# Patient Record
Sex: Female | Born: 1965
Health system: Southern US, Community
[De-identification: ages and names within clinical notes are randomized; demographics above are authoritative.]

## PROBLEM LIST (undated history)

## (undated) DIAGNOSIS — F32A Depression, unspecified: Secondary | ICD-10-CM

## (undated) DIAGNOSIS — Z800 Family history of malignant neoplasm of digestive organs: Secondary | ICD-10-CM

## (undated) DIAGNOSIS — F329 Major depressive disorder, single episode, unspecified: Secondary | ICD-10-CM

## (undated) DIAGNOSIS — B029 Zoster without complications: Secondary | ICD-10-CM

## (undated) DIAGNOSIS — F909 Attention-deficit hyperactivity disorder, unspecified type: Secondary | ICD-10-CM

## (undated) DIAGNOSIS — F419 Anxiety disorder, unspecified: Secondary | ICD-10-CM

## (undated) DIAGNOSIS — H348392 Tributary (branch) retinal vein occlusion, unspecified eye, stable: Secondary | ICD-10-CM

## (undated) DIAGNOSIS — I639 Cerebral infarction, unspecified: Secondary | ICD-10-CM

## (undated) DIAGNOSIS — N950 Postmenopausal bleeding: Secondary | ICD-10-CM

## (undated) HISTORY — PX: OTHER SURGICAL HISTORY: SHX169

## (undated) HISTORY — PX: FINGER SURGERY: SHX640

## (undated) HISTORY — DX: Cerebral infarction, unspecified: I63.9

## (undated) HISTORY — DX: Depression, unspecified: F32.A

## (undated) HISTORY — DX: Attention-deficit hyperactivity disorder, unspecified type: F90.9

## (undated) HISTORY — PX: NECK SURGERY: SHX720

## (undated) HISTORY — PX: TMJ ARTHROPLASTY: SHX1066

## (undated) HISTORY — DX: Tributary (branch) retinal vein occlusion, unspecified eye, stable: H34.8392

## (undated) HISTORY — DX: Anxiety disorder, unspecified: F41.9

## (undated) HISTORY — DX: Major depressive disorder, single episode, unspecified: F32.9

## (undated) HISTORY — DX: Postmenopausal bleeding: N95.0

## (undated) HISTORY — PX: BREAST ENHANCEMENT SURGERY: SHX7

## (undated) HISTORY — PX: SALIVARY GLAND SURGERY: SHX768

## (undated) HISTORY — DX: Family history of malignant neoplasm of digestive organs: Z80.0

---
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
## 1998-03-11 ENCOUNTER — Other Ambulatory Visit: Admission: RE | Admit: 1998-03-11 | Discharge: 1998-03-11 | Payer: Self-pay | Admitting: Obstetrics and Gynecology

## 1999-04-23 ENCOUNTER — Other Ambulatory Visit: Admission: RE | Admit: 1999-04-23 | Discharge: 1999-04-23 | Payer: Self-pay | Admitting: Obstetrics and Gynecology

## 2000-04-20 ENCOUNTER — Encounter (INDEPENDENT_AMBULATORY_CARE_PROVIDER_SITE_OTHER): Payer: Self-pay | Admitting: Specialist

## 2000-04-20 ENCOUNTER — Other Ambulatory Visit: Admission: RE | Admit: 2000-04-20 | Discharge: 2000-04-20 | Payer: Self-pay | Admitting: *Deleted

## 2000-09-05 ENCOUNTER — Other Ambulatory Visit: Admission: RE | Admit: 2000-09-05 | Discharge: 2000-09-05 | Payer: Self-pay | Admitting: Obstetrics and Gynecology

## 2001-10-09 ENCOUNTER — Other Ambulatory Visit: Admission: RE | Admit: 2001-10-09 | Discharge: 2001-10-09 | Payer: Self-pay | Admitting: Obstetrics and Gynecology

## 2003-05-03 ENCOUNTER — Other Ambulatory Visit: Admission: RE | Admit: 2003-05-03 | Discharge: 2003-05-03 | Payer: Self-pay | Admitting: Obstetrics and Gynecology

## 2003-07-15 ENCOUNTER — Ambulatory Visit (HOSPITAL_COMMUNITY): Admission: RE | Admit: 2003-07-15 | Discharge: 2003-07-15 | Payer: Self-pay | Admitting: Family Medicine

## 2003-07-15 ENCOUNTER — Encounter: Payer: Self-pay | Admitting: Family Medicine

## 2004-08-31 ENCOUNTER — Other Ambulatory Visit: Admission: RE | Admit: 2004-08-31 | Discharge: 2004-08-31 | Payer: Self-pay | Admitting: Family Medicine

## 2005-09-01 ENCOUNTER — Other Ambulatory Visit: Admission: RE | Admit: 2005-09-01 | Discharge: 2005-09-01 | Payer: Self-pay | Admitting: Family Medicine

## 2006-09-06 ENCOUNTER — Other Ambulatory Visit: Admission: RE | Admit: 2006-09-06 | Discharge: 2006-09-06 | Payer: Self-pay | Admitting: Family Medicine

## 2007-12-27 ENCOUNTER — Other Ambulatory Visit: Admission: RE | Admit: 2007-12-27 | Discharge: 2007-12-27 | Payer: Self-pay | Admitting: Family Medicine

## 2011-01-03 ENCOUNTER — Encounter: Payer: Self-pay | Admitting: Otolaryngology

## 2011-11-24 ENCOUNTER — Ambulatory Visit: Payer: Self-pay

## 2011-11-24 DIAGNOSIS — H34239 Retinal artery branch occlusion, unspecified eye: Secondary | ICD-10-CM

## 2012-11-13 ENCOUNTER — Other Ambulatory Visit (HOSPITAL_BASED_OUTPATIENT_CLINIC_OR_DEPARTMENT_OTHER): Payer: Self-pay | Admitting: Family Medicine

## 2012-11-17 ENCOUNTER — Other Ambulatory Visit: Payer: Self-pay | Admitting: Family Medicine

## 2012-11-17 DIAGNOSIS — N644 Mastodynia: Secondary | ICD-10-CM

## 2012-11-17 DIAGNOSIS — R234 Changes in skin texture: Secondary | ICD-10-CM

## 2012-12-13 DIAGNOSIS — H348392 Tributary (branch) retinal vein occlusion, unspecified eye, stable: Secondary | ICD-10-CM

## 2012-12-13 DIAGNOSIS — I639 Cerebral infarction, unspecified: Secondary | ICD-10-CM

## 2012-12-13 HISTORY — DX: Cerebral infarction, unspecified: I63.9

## 2012-12-13 HISTORY — DX: Tributary (branch) retinal vein occlusion, unspecified eye, stable: H34.8392

## 2013-07-11 ENCOUNTER — Emergency Department (HOSPITAL_BASED_OUTPATIENT_CLINIC_OR_DEPARTMENT_OTHER)
Admission: EM | Admit: 2013-07-11 | Discharge: 2013-07-11 | Disposition: A | Discharge status: Home / Self Care | Payer: Self-pay | Attending: Emergency Medicine | Admitting: Emergency Medicine

## 2013-07-11 ENCOUNTER — Encounter (HOSPITAL_BASED_OUTPATIENT_CLINIC_OR_DEPARTMENT_OTHER): Payer: Self-pay | Admitting: *Deleted

## 2013-07-11 ENCOUNTER — Emergency Department (HOSPITAL_BASED_OUTPATIENT_CLINIC_OR_DEPARTMENT_OTHER): Payer: Self-pay

## 2013-07-11 DIAGNOSIS — F329 Major depressive disorder, single episode, unspecified: Secondary | ICD-10-CM | POA: Insufficient documentation

## 2013-07-11 DIAGNOSIS — Z880 Allergy status to penicillin: Secondary | ICD-10-CM | POA: Insufficient documentation

## 2013-07-11 DIAGNOSIS — F3289 Other specified depressive episodes: Secondary | ICD-10-CM | POA: Insufficient documentation

## 2013-07-11 DIAGNOSIS — S93609A Unspecified sprain of unspecified foot, initial encounter: Secondary | ICD-10-CM | POA: Insufficient documentation

## 2013-07-11 DIAGNOSIS — Y9289 Other specified places as the place of occurrence of the external cause: Secondary | ICD-10-CM | POA: Insufficient documentation

## 2013-07-11 DIAGNOSIS — S93602A Unspecified sprain of left foot, initial encounter: Secondary | ICD-10-CM

## 2013-07-11 DIAGNOSIS — Y939 Activity, unspecified: Secondary | ICD-10-CM | POA: Insufficient documentation

## 2013-07-11 DIAGNOSIS — Z79899 Other long term (current) drug therapy: Secondary | ICD-10-CM | POA: Insufficient documentation

## 2013-07-11 DIAGNOSIS — X500XXA Overexertion from strenuous movement or load, initial encounter: Secondary | ICD-10-CM | POA: Insufficient documentation

## 2013-07-11 DIAGNOSIS — I10 Essential (primary) hypertension: Secondary | ICD-10-CM | POA: Insufficient documentation

## 2013-07-11 HISTORY — DX: Depression, unspecified: F32.A

## 2013-07-11 HISTORY — DX: Major depressive disorder, single episode, unspecified: F32.9

## 2013-07-11 NOTE — ED Notes (Signed)
Pt to room 7 with slow steady gait in nad. Pt reports fall in her bathroom x 2 weeks ago, "the arch of my foot got twisted..." shoe and ace brace removed to reveal swelling and contusions to arch area, inner and outer aspects. Pt denies any other injury or c/o.

## 2013-07-11 NOTE — ED Notes (Signed)
MD at bedside. 

## 2013-07-11 NOTE — ED Provider Notes (Signed)
  CSN: [id number]     Arrival date & time 07/11/13  1021 History     First MD Initiated Contact with Patient 07/11/13 1030     Chief Complaint  Patient presents with  . Foot Pain  . Fall    HPI Pt to room 7 with slow steady gait in nad. Pt reports fall in her bathroom x 2 weeks ago, "the arch of my foot got twisted..." shoe and ace brace removed to reveal swelling and contusions to arch area, inner and outer aspects. Pt denies any other injury or c/o.  Past Medical History  Diagnosis Date  . Hypertension   . Depression    History reviewed. No pertinent past surgical history. History reviewed. No pertinent family history. History  Substance Use Topics  . Smoking status: Never Smoker   . Smokeless tobacco: Not on file  . Alcohol Use: Not on file   OB History   Grav Para Term Preterm Abortions TAB SAB Ect Mult Living                 Review of Systems All other systems reviewed and are negative Allergies  Penicillins and Sulfa antibiotics  Home Medications   Current Outpatient Rx  Name  Route  Sig  Dispense  Refill  . carvedilol (COREG) 6.25 MG tablet   Oral   Take 6.25 mg by mouth 2 (two) times daily with a meal.         . FLUoxetine (PROZAC) 20 MG capsule   Oral   Take 20 mg by mouth 2 (two) times daily.          BP 117/60  Pulse 69  Temp(Src) 97.5 F (36.4 C) (Oral)  Resp 20  SpO2 99% Physical Exam  Nursing note and vitals reviewed. Constitutional: She is oriented to person, place, and time. She appears well-developed and well-nourished. No distress.  HENT:  Head: Normocephalic and atraumatic.  Eyes: Pupils are equal, round, and reactive to light.  Neck: Normal range of motion.  Cardiovascular: Normal rate and intact distal pulses.   Pulmonary/Chest: No respiratory distress.  Abdominal: Normal appearance. She exhibits no distension.  Musculoskeletal:       Left foot: She exhibits tenderness.       Feet:  Neurological: She is alert and oriented to  person, place, and time. No cranial nerve deficit.  Skin: Skin is warm and dry. No rash noted.  Psychiatric: She has a normal mood and affect. Her behavior is normal.    ED Course   Procedures (including critical care time)  Labs Reviewed - No data to display Dg Foot Complete Left  07/11/2013   *RADIOLOGY REPORT*  Clinical Data: Pain post fall, twisted foot  LEFT FOOT - COMPLETE 3+ VIEW  Comparison: None.  Findings: Three views of the left foot submitted.  No acute fracture or subluxation.  Plantar spur of the calcaneus is noted.  IMPRESSION: No acute fracture or subluxation.  Plantar spurring of the calcaneus.   Original Report Authenticated By: Natasha Mead, M.D.   1. Foot sprain, left, initial encounter     MDM    Nelia Shi, MD 07/11/13 1136

## 2014-01-15 ENCOUNTER — Ambulatory Visit (INDEPENDENT_AMBULATORY_CARE_PROVIDER_SITE_OTHER): Payer: Self-pay | Admitting: Family

## 2014-01-15 ENCOUNTER — Encounter: Payer: Self-pay | Admitting: Family

## 2014-01-15 VITALS — BP 154/98 | HR 97 | Ht 64.5 in | Wt 166.0 lb

## 2014-01-15 DIAGNOSIS — I10 Essential (primary) hypertension: Secondary | ICD-10-CM | POA: Insufficient documentation

## 2014-01-15 DIAGNOSIS — Z3009 Encounter for other general counseling and advice on contraception: Secondary | ICD-10-CM

## 2014-01-15 DIAGNOSIS — F988 Other specified behavioral and emotional disorders with onset usually occurring in childhood and adolescence: Secondary | ICD-10-CM

## 2014-01-15 DIAGNOSIS — F411 Generalized anxiety disorder: Secondary | ICD-10-CM | POA: Insufficient documentation

## 2014-01-15 MED ORDER — CARVEDILOL 6.25 MG PO TABS: 6.2500 mg | ORAL_TABLET | Freq: Two times a day (BID) | ORAL | Status: DC

## 2014-01-15 NOTE — Progress Notes (Signed)
Pre visit review using our clinic review tool, if applicable. No additional management support is needed unless otherwise documented below in the visit note. 

## 2014-01-15 NOTE — Patient Instructions (Signed)
Contraception Choices °Birth control (contraception) is the use of any methods or devices to stop pregnancy from happening. Below are some methods to help avoid pregnancy. °HORMONAL BIRTH CONTROL °· A small tube put under the skin of the upper arm (implant). The tube can stay in place for 3 years. The implant must be taken out after 3 years. °· Shots given every 3 months. °· Pills taken every day. °· Patches that are changed once a week. °· A ring put into the vagina (vaginal ring). The ring is left in place for 3 weeks and removed for 1 week. Then, a new ring is put in the vagina. °· Emergency birth control pills taken after unprotected sex (intercourse). °BARRIER BIRTH CONTROL  °· A thin covering worn on the penis (female condom) during sex. °· A soft, loose covering put into the vagina (female condom) before sex. °· A rubber bowl that sits over the cervix (diaphragm). The bowl must be made for you. The bowl is put into the vagina before sex. The bowl is left in place for 6 to 8 hours after sex. °· A small, soft cup that fits over the cervix (cervical cap). The cup must be made for you. The cup can be left in place for 48 hours after sex. °· A sponge that is put into the vagina before sex. °· A chemical that kills or stops sperm from getting into the cervix and uterus (spermicide). The chemical may be a cream, jelly, foam, or pill. °INTRAUTERINE (IUD) BIRTH CONTROL  °· IUD birth control is a small, T-shaped piece of plastic. The plastic is put inside the uterus. There are 2 types of IUD: °· Copper IUD. The IUD is covered in copper wire. The copper makes a fluid that kills sperm. It can stay in place for 10 years. °· Hormone IUD. The hormone stops pregnancy from happening. It can stay in place for 5 years. °PERMANENT METHODS °· When the woman has her fallopian tubes sealed, tied, or blocked during surgery. This stops the egg from traveling to the uterus. °· The doctor places a small coil or insert into each fallopian  tube. This causes scar tissue to form and blocks the fallopian tubes. °· When the female has the tubes that carry sperm tied off (vasectomy). °NATURAL FAMILY PLANNING BIRTH CONTROL  °· Natural family planning means not having sex or using barrier birth control on the days the woman could become pregnant. °· Use a calendar to keep track of the length of each period and know the days she can get pregnant. °· Avoid sex during ovulation. °· Use a thermometer to measure body temperature. Also watch for symptoms of ovulation. °· Time sex to be after the woman has ovulated. °Use condoms to help protect yourself against sexually transmitted infections (STIs). Do this no matter what type of birth control you use. Talk to your doctor about which type of birth control is best for you. °Document Released: 09/26/2009 Document Revised: 08/01/2013 Document Reviewed: 06/20/2013 °[employer]® Patient Information ©2014 [employer]. ° °

## 2014-01-15 NOTE — Progress Notes (Signed)
Subjective:    Patient ID: Haley MichaelisLisa M Cuddeback, female    DOB: Apr 15, 1966, 48 y.o.   MRN: [id number]  HPI 48 year old white female, nonsmoker is in today to be established. 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 She is currently under the care of psychiatry. Has been taking carvedilol 6.25 twice a day for management of blood pressure and tolerating it well. She's been out of the medication for 2 weeks. She would also like to talk about birth control options as she is planning to marry soon. She has a normal menstrual cycle monthly. Not currently on any birth control.   Review of Systems  Constitutional: Negative.   HENT: Negative.   Respiratory: Negative.   Cardiovascular: Negative.  Negative for chest pain and leg swelling.  Gastrointestinal: Negative.   Endocrine: Negative.   Genitourinary: Negative.   Musculoskeletal: Negative.   Skin: Negative.   Neurological: Negative.   Hematological: Negative.   Psychiatric/Behavioral: Negative.    Past Medical History  Diagnosis Date  . Hypertension   . Depression   . Stroke     stroke in rt eye 2014    History   Social History  . Marital Status: Divorced    Spouse Name: N/A    Number of Children: N/A  . Years of Education: N/A   Occupational History  . Not on file.   Social History Main Topics  . Smoking status: Never Smoker   . Smokeless tobacco: Not on file  . Alcohol Use: Not on file  . Drug Use: Not on file  . Sexual Activity: Not on file   Other Topics Concern  . Not on file   Social History Narrative  . No narrative on file    Past Surgical History  Procedure Laterality Date  . Breast enhancement surgery    . Salivary gland surgery      stone   . Neck surgery    . Tmj arthroplasty    . Finger surgery      cyst    No family history on file.  Allergies  Allergen Reactions  . Penicillins   . Sulfa Antibiotics     Current Outpatient Prescriptions  on File Prior to Visit  Medication Sig Dispense Refill  . FLUoxetine (PROZAC) 20 MG capsule Take 20 mg by mouth 2 (two) times daily.       No current facility-administered medications on file prior to visit.    BP 154/98  Pulse 97  Ht 5' 4.5" (1.638 m)  Wt 166 lb (75.297 kg)  BMI 28.06 kg/m2  LMP 12/30/2014chart    Objective:   Physical Exam  Constitutional: She is oriented to person, place, and time. She appears well-developed and well-nourished.  HENT:  Right Ear: External ear normal.  Left Ear: External ear normal.  Nose: Nose normal.  Mouth/Throat: Oropharynx is clear and moist.  Neck: Normal range of motion. Neck supple.  Cardiovascular: Normal rate, regular rhythm and normal heart sounds.   Pulmonary/Chest: Effort normal and breath sounds normal.  Abdominal: Soft. Bowel sounds are normal.  Musculoskeletal: Normal range of motion.  Neurological: She is alert and oriented to person, place, and time.  Skin: Skin is warm.  Psychiatric: She has a normal mood and affect.          Assessment & Plan:  Assessment: 1. Hypertension-resume carvedilol twice a day low-sodium diet. 2. Attention deficit disorder-managed by psychiatry 3. Anxiety-managed by psychiatry 4. Birth  control counseling-consider Depo Provera or Mirena. Information provided today. She is not a candidate for oral contraceptive pills.

## 2014-01-16 ENCOUNTER — Telehealth: Payer: Self-pay | Admitting: Family

## 2014-01-16 NOTE — Telephone Encounter (Signed)
Relevant patient education mailed to patient.  

## 2014-07-22 ENCOUNTER — Other Ambulatory Visit: Payer: Self-pay | Admitting: Family

## 2014-09-12 ENCOUNTER — Ambulatory Visit (INDEPENDENT_AMBULATORY_CARE_PROVIDER_SITE_OTHER): Payer: Self-pay | Admitting: *Deleted

## 2014-09-12 DIAGNOSIS — Z23 Encounter for immunization: Secondary | ICD-10-CM

## 2014-10-14 ENCOUNTER — Encounter: Payer: Self-pay | Admitting: Family

## 2015-01-20 ENCOUNTER — Ambulatory Visit: Payer: Self-pay | Admitting: Family Medicine

## 2015-01-20 ENCOUNTER — Encounter: Payer: Self-pay | Admitting: Family Medicine

## 2015-03-25 ENCOUNTER — Encounter: Payer: Self-pay | Admitting: Adult Health

## 2015-03-25 ENCOUNTER — Ambulatory Visit (INDEPENDENT_AMBULATORY_CARE_PROVIDER_SITE_OTHER): Payer: No Typology Code available for payment source | Admitting: Adult Health

## 2015-03-25 VITALS — BP 124/88 | Temp 98.7°F | Wt 179.1 lb

## 2015-03-25 DIAGNOSIS — T148XXA Other injury of unspecified body region, initial encounter: Secondary | ICD-10-CM

## 2015-03-25 DIAGNOSIS — S46911A Strain of unspecified muscle, fascia and tendon at shoulder and upper arm level, right arm, initial encounter: Secondary | ICD-10-CM

## 2015-03-25 DIAGNOSIS — T148 Other injury of unspecified body region: Secondary | ICD-10-CM

## 2015-03-25 MED ORDER — CYCLOBENZAPRINE HCL 10 MG PO TABS: 10.0000 mg | ORAL_TABLET | Freq: Every day | ORAL | Status: DC

## 2015-03-25 NOTE — Patient Instructions (Signed)
You likely have a muscle strain and bruise from your fall. I am not concerned for any type of fracture as you have great range in your right arm and shoulder. Take 600mg  Ibuprofen every eight hours and Flexeril as needed at night time, as they can make you drowsy. You can also use ice or heat as you see fit.  You will probably bruise more, but if the bruising becomes severe or you are unable to move your arm, please follow up with me. You should start to feel better in 2-3 days.

## 2015-03-25 NOTE — Progress Notes (Signed)
Pre visit review using our clinic review tool, if applicable. No additional management support is needed unless otherwise documented below in the visit note. 

## 2015-03-25 NOTE — Progress Notes (Signed)
   Subjective:    Patient ID: Haley MichaelisLisa M Martin, female    DOB: Feb 21, 1966, 49 y.o.   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 This fall resulted in her hitting her left shoulder onto the bottom of the hot tub. She only complains of slight pain to shoulder and bruising. Has not taken anything for pain.    Review of Systems  Musculoskeletal: Negative for joint swelling, neck pain and neck stiffness.       Pain and bruising to right shoulder  All other systems reviewed and are negative.  Past Medical History  Diagnosis Date  . Hypertension   . Anxiety and depression   . Stroke     stroke in rt eye 2014  . Adult ADHD     History   Social History  . Marital Status: Divorced    Spouse Name: N/A  . Number of Children: N/A  . Years of Education: N/A   Occupational History  . Not on file.   Social History Main Topics  . Smoking status: Never Smoker   . Smokeless tobacco: Not on file  . Alcohol Use: Not on file  . Drug Use: Not on file  . Sexual Activity: Not on file   Other Topics Concern  . Not on file   Social History Narrative    Past Surgical History  Procedure Laterality Date  . Breast enhancement surgery    . Salivary gland surgery      stone   . Neck surgery    . Tmj arthroplasty    . Finger surgery      cyst    No family history on file.  Allergies  Allergen Reactions  . Penicillins Anaphylaxis  . Sulfa Antibiotics Anaphylaxis    Current Outpatient Prescriptions on File Prior to Visit  Medication Sig Dispense Refill  . ALPRAZolam (XANAX) 1 MG tablet Take 1 mg by mouth at bedtime as needed for anxiety.    . carvedilol (COREG) 6.25 MG tablet TAKE ONE TABLET BY MOUTH TWICE DAILY WITH MEALS  180 tablet 0  . FLUoxetine (PROZAC) 20 MG capsule Take 20 mg by mouth 2 (two) times daily.    . methylphenidate (RITALIN) 20 MG tablet Take 20 mg by mouth 3 (three) times daily with meals.      No current facility-administered medications on file prior to visit.    BP 124/88 mmHg  Temp(Src) 98.7 F (37.1 C) (Oral)  Wt 179 lb 1.6 oz (81.239 kg)       Objective:   Physical Exam  Constitutional: She appears well-developed and well-nourished. No distress.  Neck: Normal range of motion. Neck supple.  Musculoskeletal: Normal range of motion.  Has complete ROM in right arm/shoulder  Skin:  Small quarter size bruise on lateral aspect of right shoulder        Assessment & Plan:  Muscle Strain/Hematoma - No concern for fracture or dislocation due to abscense of defomity and the ability to have full ROM - Ibuprofen for pain control and swelling - Prescribed Flexeril 10 mg QHS PRN for muscle pain - Ice/heat  - Follow up as needed.

## 2015-04-21 ENCOUNTER — Ambulatory Visit: Payer: Self-pay | Admitting: Nurse Practitioner

## 2015-07-25 ENCOUNTER — Other Ambulatory Visit: Payer: Self-pay | Admitting: Family

## 2015-07-25 ENCOUNTER — Encounter: Payer: Self-pay | Admitting: Adult Health

## 2015-07-25 ENCOUNTER — Ambulatory Visit (INDEPENDENT_AMBULATORY_CARE_PROVIDER_SITE_OTHER): Payer: No Typology Code available for payment source | Admitting: Adult Health

## 2015-07-25 VITALS — BP 132/100 | Temp 98.1°F | Ht 64.5 in | Wt 175.9 lb

## 2015-07-25 DIAGNOSIS — I10 Essential (primary) hypertension: Secondary | ICD-10-CM | POA: Diagnosis not present

## 2015-07-25 LAB — BASIC METABOLIC PANEL
BUN: 10 mg/dL (ref 6–23)
CALCIUM: 9.5 mg/dL (ref 8.4–10.5)
CO2: 30 meq/L (ref 19–32)
CREATININE: 0.71 mg/dL (ref 0.40–1.20)
Chloride: 101 mEq/L (ref 96–112)
GFR: 92.26 mL/min (ref >60.00)
GLUCOSE: 100 mg/dL — AB (ref 70–99)
Potassium: 4.3 mEq/L (ref 3.5–5.1)
Sodium: 137 mEq/L (ref 135–145)

## 2015-07-25 MED ORDER — HYDROCHLOROTHIAZIDE 12.5 MG PO CAPS: 12.5000 mg | ORAL_CAPSULE | Freq: Every day | ORAL | Status: DC

## 2015-07-25 NOTE — Patient Instructions (Signed)
It was great meeting you.   I will follow up with you regarding your blood work.   Take the HCTZ as directed.   Start monitoring your blood pressure at home and bring the log with you to your next appointment.

## 2015-07-25 NOTE — Progress Notes (Signed)
Subjective:    Patient ID: Haley Martin, female    DOB: Jan 16, 1966, 49 y.o.   MRN: [id number]  HPI 49 year old female who presents to the office for blood pressure check. She is on Coreg 6.25. Today in the offce her blood pressure is 132/100. She continues to have blurred vision in her right eye form her " stroke in my eye". Is followed by optho for this.   Denies any headaches, dizziness, lightheadedness, SOB or CP   Review of Systems  Constitutional: Negative.   Eyes: Positive for visual disturbance.  Respiratory: Negative.   Cardiovascular: Negative.   Gastrointestinal: Negative.   Genitourinary: Negative.   Musculoskeletal: Negative.   Neurological: Negative.   All other systems reviewed and are negative.  Past Medical History  Diagnosis Date  . Hypertension   . Anxiety and depression   . Stroke     stroke in rt eye 2014  . Adult ADHD     Social History   Social History  . Marital Status: Divorced    Spouse Name: N/A  . Number of Children: N/A  . Years of Education: N/A   Occupational History  . Not on file.   Social History Main Topics  . Smoking status: Never Smoker   . Smokeless tobacco: Not on file  . Alcohol Use: Not on file  . Drug Use: Not on file  . Sexual Activity: Not on file   Other Topics Concern  . Not on file   Social History Narrative    Past Surgical History  Procedure Laterality Date  . Breast enhancement surgery    . Salivary gland surgery      stone   . Neck surgery    . Tmj arthroplasty    . Finger surgery      cyst    No family history on file.  Allergies  Allergen Reactions  . Penicillins Anaphylaxis  . Sulfa Antibiotics Anaphylaxis    Current Outpatient Prescriptions on File Prior to Visit  Medication Sig Dispense Refill  . ALPRAZolam (XANAX) 1 MG tablet Take 1 mg by mouth at bedtime as needed for anxiety.    Marland Kitchen buPROPion (WELLBUTRIN SR) 150 MG 12 hr tablet     . carvedilol (COREG) 6.25 MG tablet TAKE ONE TABLET  BY MOUTH TWICE DAILY WITH MEALS  180 tablet 0  . FLUoxetine (PROZAC) 20 MG capsule Take 20 mg by mouth 2 (two) times daily.    . methylphenidate (RITALIN) 20 MG tablet Take 20 mg by mouth 3 (three) times daily with meals.     No current facility-administered medications on file prior to visit.    BP 132/100 mmHg  Temp(Src) 98.1 F (36.7 C) (Oral)  Ht 5' 4.5" (1.638 m)  Wt 175 lb 14.4 oz (79.788 kg)  BMI 29.74 kg/m2       Objective:   Physical Exam  Constitutional: She is oriented to person, place, and time. She appears well-developed and well-nourished.  Cardiovascular: Normal rate, regular rhythm, normal heart sounds and intact distal pulses.  Exam reveals no gallop and no friction rub.   No murmur heard. Pulmonary/Chest: Effort normal and breath sounds normal. No respiratory distress. She has no wheezes. She has no rales. She exhibits no tenderness.  Musculoskeletal: Normal range of motion. She exhibits no edema or tenderness.  Neurological: She is alert and oriented to person, place, and time.  Skin: Skin is warm and dry. No rash noted. No erythema. No pallor.  Psychiatric:  She has a normal mood and affect. Her behavior is normal. Judgment and thought content normal.  Nursing note and vitals reviewed.      Assessment & Plan:  1. Essential hypertension - hydrochlorothiazide (MICROZIDE) 12.5 MG capsule; Take 1 capsule (12.5 mg total) by mouth daily.  Dispense: 30 capsule; Refill: 3 - Basic metabolic panel - Monitor BP at home and bring log.  - Inform me if BP goes below 110 systolic.

## 2015-07-25 NOTE — Progress Notes (Signed)
Pre visit review using our clinic review tool, if applicable. No additional management support is needed unless otherwise documented below in the visit note. 

## 2015-07-28 ENCOUNTER — Telehealth: Payer: Self-pay | Admitting: Family

## 2015-07-28 MED ORDER — CARVEDILOL 6.25 MG PO TABS: 6.2500 mg | ORAL_TABLET | Freq: Two times a day (BID) | ORAL | Status: DC

## 2015-07-28 NOTE — Telephone Encounter (Signed)
Pt saw cory on 07-25-15 and needs refill on carvedilol 6.25 mg #180 send to [facility]

## 2015-07-28 NOTE — Telephone Encounter (Signed)
Rx sent to pharmacy   

## 2015-09-10 ENCOUNTER — Ambulatory Visit: Payer: No Typology Code available for payment source | Admitting: Adult Health

## 2015-09-10 ENCOUNTER — Encounter: Payer: Self-pay | Admitting: *Deleted

## 2015-09-10 ENCOUNTER — Ambulatory Visit (INDEPENDENT_AMBULATORY_CARE_PROVIDER_SITE_OTHER): Payer: No Typology Code available for payment source | Admitting: Adult Health

## 2015-09-10 ENCOUNTER — Encounter: Payer: Self-pay | Admitting: Adult Health

## 2015-09-10 VITALS — BP 122/70 | HR 97 | Temp 98.8°F | Ht 64.0 in | Wt 178.8 lb

## 2015-09-10 DIAGNOSIS — F329 Major depressive disorder, single episode, unspecified: Secondary | ICD-10-CM | POA: Diagnosis not present

## 2015-09-10 DIAGNOSIS — I10 Essential (primary) hypertension: Secondary | ICD-10-CM

## 2015-09-10 DIAGNOSIS — F411 Generalized anxiety disorder: Secondary | ICD-10-CM | POA: Diagnosis not present

## 2015-09-10 DIAGNOSIS — R4789 Other speech disturbances: Secondary | ICD-10-CM

## 2015-09-10 DIAGNOSIS — Z7189 Other specified counseling: Secondary | ICD-10-CM | POA: Diagnosis not present

## 2015-09-10 DIAGNOSIS — F32A Depression, unspecified: Secondary | ICD-10-CM

## 2015-09-10 DIAGNOSIS — Z7689 Persons encountering health services in other specified circumstances: Secondary | ICD-10-CM

## 2015-09-10 DIAGNOSIS — Z23 Encounter for immunization: Secondary | ICD-10-CM | POA: Diagnosis not present

## 2015-09-10 LAB — CBC WITH DIFFERENTIAL/PLATELET
BASOS ABS: 0 10*3/uL (ref 0.0–0.1)
Basophils Relative: 0.3 % (ref 0.0–3.0)
Eosinophils Absolute: 0.1 10*3/uL (ref 0.0–0.7)
Eosinophils Relative: 1 % (ref 0.0–5.0)
HEMATOCRIT: 47.3 % — AB (ref 36.0–46.0)
Hemoglobin: 15.9 g/dL — ABNORMAL HIGH (ref 12.0–15.0)
LYMPHS PCT: 24.1 % (ref 12.0–46.0)
Lymphs Abs: 2.7 10*3/uL (ref 0.7–4.0)
MCHC: 33.5 g/dL (ref 30.0–36.0)
MCV: 96 fl (ref 78.0–100.0)
MONOS PCT: 6.6 % (ref 3.0–12.0)
Monocytes Absolute: 0.7 10*3/uL (ref 0.1–1.0)
NEUTROS PCT: 68 % (ref 43.0–77.0)
Neutro Abs: 7.6 10*3/uL (ref 1.4–7.7)
Platelets: 318 10*3/uL (ref 150.0–400.0)
RBC: 4.93 Mil/uL (ref 3.87–5.11)
RDW: 12.6 % (ref 11.5–15.5)
WBC: 11.2 10*3/uL — ABNORMAL HIGH (ref 4.0–10.5)

## 2015-09-10 LAB — BASIC METABOLIC PANEL
BUN: 9 mg/dL (ref 6–23)
CALCIUM: 9.5 mg/dL (ref 8.4–10.5)
CO2: 30 mEq/L (ref 19–32)
Chloride: 99 mEq/L (ref 96–112)
Creatinine, Ser: 0.69 mg/dL (ref 0.40–1.20)
GFR: 95.3 mL/min (ref >60.00)
Glucose, Bld: 97 mg/dL (ref 70–99)
Potassium: 4 mEq/L (ref 3.5–5.1)
SODIUM: 137 meq/L (ref 135–145)

## 2015-09-10 LAB — VITAMIN B12: VITAMIN B 12: 1309 pg/mL — AB (ref 211–911)

## 2015-09-10 LAB — TSH: TSH: 0.84 u[IU]/mL (ref 0.35–4.50)

## 2015-09-10 NOTE — Progress Notes (Signed)
HPI:  Haley Martin is here to establish care. She is a very pleasant 49 year old Caucasian female who  has a past medical history of Hypertension; Anxiety and depression; Stroke; and Adult ADHD.  Last PCP and physical: Unknown >6 years ago Immunizations: UTD Diet: Stays away from carbs. Tries to eat healthy. She does eat processed foods often.  Exercise: has started walking dogs. She does clean houses  Colonoscopy: Never had one, will schedule  Pap Smear:5-6 years ago Mammogram: 10 years ago  Dr. Lafayette Dragon - Sees Psychiatry, sees every 6 months Has the following chronic problems that require follow up and concerns today:  Depression - She is taking Xanax 1 mg at bedtime when necessary as well as Wellbutrin 150 mg every 12 hours, additionally she is also taking Prozac 20 mg 2 times a day. Recently she's had a lot of stress in her life dealing with family issues, especially in attempted suicide by jumping off a bridge of her son. He does not currently feel like her depression is well controlled. She is not suicidal or homicidal.   Word Finding issues - Started around four months ago. 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 I could imagine the word 'toaster' but I could not say it." " [place], I kept saying tent instead of sleeping bag'. She has noticed that there are times when she has forgotten to turn her car off. Her word finding issues do not happen every day but are beginning to happen more often. Denies any trauma to head.    ROS negative for unless reported above: fevers, chills,feeling poorly, unintentional weight loss, hearing or vision loss, chest pain, palpitations, leg claudication, struggling to breath,Not feeling congested in the chest, no orthopenia, no cough,no wheezing, normal appetite, no soft tissue  swelling, no hemoptysis, melena, hematochezia, hematuria, falls, loc, si, or thoughts of self harm.    Past Medical History  Diagnosis Date  . Hypertension   . Anxiety and depression   . Stroke     stroke in rt eye 2014  . Adult ADHD     Past Surgical History  Procedure Laterality Date  . Breast enhancement surgery    . Salivary gland surgery      stone   . Neck surgery    . Tmj arthroplasty    . Finger surgery      cyst    No family history on file.  Social History   Social History  . Marital Status: Divorced    Spouse Name: N/A  . Number of Children: N/A  . Years of Education: N/A   Social History Main Topics  . Smoking status: Never Smoker   . Smokeless tobacco: None  . Alcohol Use: None  . Drug Use: None  . Sexual Activity: Not Asked   Other Topics Concern  . None   Social History Narrative     Current outpatient prescriptions:  .  ALPRAZolam (XANAX) 1 MG tablet, Take 1 mg by mouth at bedtime as needed for anxiety., Disp: , Rfl:  .  buPROPion (WELLBUTRIN SR) 150 MG 12 hr tablet, , Disp: , Rfl:  .  carvedilol (COREG) 6.25 MG tablet, Take 1 tablet (6.25 mg total) by mouth 2 (two) times daily with a meal., Disp: 180 tablet, Rfl: 1 .  FLUoxetine (PROZAC) 20 MG capsule, Take 20 mg by mouth 2 (two) times daily., Disp: , Rfl:  .  hydrochlorothiazide (MICROZIDE) 12.5 MG capsule, Take 1 capsule (12.5 mg total) by mouth daily., Disp: 30 capsule, Rfl: 3 .  methylphenidate (RITALIN) 20 MG tablet, Take 20 mg by mouth 3 (three) times daily with meals., Disp: , Rfl:   EXAM:  Filed Vitals:   09/10/15 1413  BP: 122/70  Pulse: 97  Temp: 98.8 F (37.1 C)    Body mass index is 30.68 kg/(m^2).  GENERAL: vitals reviewed and listed above, alert, oriented, appears well hydrated and in no acute distress  HEENT: atraumatic, conjunttiva clear, no obvious abnormalities on inspection of external nose and ears.  tympanic membranes visualized, no cerumen impaction.   Bilateral eyes PERRLA.  NECK: Neck is soft and supple without masses, no adenopathy or thyromegaly, trachea midline, no JVD. Normal range of motion.   LUNGS: clear to auscultation bilaterally, no wheezes, rales or rhonchi, good air movement  CV: Regular rate and rhythm, normal S1/S2, no audible murmurs, gallops, or rubs. No carotid bruit and no peripheral edema.   MS: moves all extremities without noticeable abnormality. No edema noted  Abd: soft/nontender/nondistended/normal bowel sounds   Skin: warm and dry, no rash. Has large cysts on top of head  Extremities: No clubbing, cyanosis, or edema. Capillary refill is WNL. Pulses intact bilaterally in upper and lower extremities.   Neuro: CN II-XII intact, sensation and reflexes normal throughout, 5/5 muscle strength in bilateral upper and lower extremities. Normal finger to nose. Normal rapid alternating movements. Normal romberg. No pronator drift.   PSYCH: pleasant and cooperative, multiple crying episodes and office appointment today.  ASSESSMENT AND PLAN:  1. Encounter to establish care -Follow-up in one month for CPE -Follow up sooner if needed  2. Word finding difficulty -Concern for previous on diagnosis stroke, or new stroke due to aphasia - MR Brain W Wo Contrast; Future - US Carotid Duplex Bilateral; Future - Vitamin B12 - TSH - Basic metabolic panel - CBC with Differential/Platelet -Follow-up in one month, or sooner if needed -Consider daily aspirin therapy if MRI shows no acute bleed -Consider echo to rule out any issues with emboli. -Consider referral to neurology.  3. Essential hypertension -Controlled with current medication. She does not check it at home on a regular basis. Denies any headaches blurred vision or dizziness. We'll continue to monitor - Basic metabolic panel  4. Encounter for immunization -Vaccination given  5. Depression -Encouraged to follow-up with her psychiatrist more often than every  6 months. She might have to see her psychiatrist once a month for a limited amount of time. -Continue to take current medications - Vitamin B12 - TSH   -We reviewed the PMH, PSH, FH, SH, Meds and Allergies. -We provided refills for any medications we will prescribe as needed. -We addressed current concerns per orders and patient instructions. -We have asked for records for pertinent exams, studies, vaccines and notes from previous providers. -We have advised patient to follow up per instructions below.   -Patient advised to return or notify a provider immediately if symptoms worsen or persist or new concerns arise.   Shirline Frees, AGNP

## 2015-09-10 NOTE — Patient Instructions (Signed)
It was great seeing you again today!  I will follow up with you regarding labs.   Someone will call you to schedule a MRI, Ultrasound and Colonoscopy.   Please come back and see me in 1 months for complete physical exam .

## 2015-09-10 NOTE — Progress Notes (Signed)
Pre visit review using our clinic review tool, if applicable. No additional management support is needed unless otherwise documented below in the visit note. 

## 2015-09-11 ENCOUNTER — Other Ambulatory Visit: Payer: Self-pay | Admitting: Adult Health

## 2015-09-11 DIAGNOSIS — R4702 Dysphasia: Secondary | ICD-10-CM

## 2015-09-19 ENCOUNTER — Ambulatory Visit (HOSPITAL_COMMUNITY)
Admission: RE | Admit: 2015-09-19 | Discharge: 2015-09-19 | Disposition: A | Discharge status: Home / Self Care | Payer: No Typology Code available for payment source | Source: Ambulatory Visit | Attending: Cardiovascular Disease | Admitting: Cardiovascular Disease

## 2015-09-19 ENCOUNTER — Telehealth: Payer: Self-pay

## 2015-09-19 DIAGNOSIS — R4702 Dysphasia: Secondary | ICD-10-CM | POA: Insufficient documentation

## 2015-09-19 DIAGNOSIS — I10 Essential (primary) hypertension: Secondary | ICD-10-CM | POA: Diagnosis not present

## 2015-09-19 HISTORY — PX: OTHER SURGICAL HISTORY: SHX169

## 2015-09-19 NOTE — Telephone Encounter (Signed)
-----   Message from Shirline Frees, NP sent at 09/19/2015  8:28 AM EDT ----- Regarding: FW: Brain MRI Do you mind making sure that the patient received this information.   Thank you ----- Message -----    From: Sampson Goon    Sent: 09/16/2015   1:36 PM      To: Shirline Frees, NP Subject: Brain MRI                                        Kandee Keen  I have the patient scheduled for her brain MRI on 09-24-15 at 2:45 at [facility].  I could not leave a message as her mailbox was full so I left a message with her sister to call me back.  Thanks [employer]

## 2015-09-19 NOTE — Telephone Encounter (Signed)
Spoke with pt and pt is aware

## 2015-09-22 ENCOUNTER — Telehealth: Payer: Self-pay | Admitting: Adult Health

## 2015-09-22 NOTE — Telephone Encounter (Signed)
Left VM to discuss US results.

## 2015-09-24 ENCOUNTER — Ambulatory Visit (HOSPITAL_COMMUNITY)
Admission: RE | Admit: 2015-09-24 | Discharge: 2015-09-24 | Disposition: A | Discharge status: Home / Self Care | Payer: No Typology Code available for payment source | Source: Ambulatory Visit | Attending: Adult Health | Admitting: Adult Health

## 2015-09-24 DIAGNOSIS — G319 Degenerative disease of nervous system, unspecified: Secondary | ICD-10-CM | POA: Insufficient documentation

## 2015-09-24 DIAGNOSIS — R4789 Other speech disturbances: Secondary | ICD-10-CM | POA: Insufficient documentation

## 2015-09-24 MED ORDER — GADOBENATE DIMEGLUMINE 529 MG/ML IV SOLN
20.0000 mL | Freq: Once | INTRAVENOUS | Status: AC | PRN
  Administered 2015-09-24: 16 mL via INTRAVENOUS

## 2015-09-29 ENCOUNTER — Telehealth: Payer: Self-pay | Admitting: Adult Health

## 2015-09-29 ENCOUNTER — Other Ambulatory Visit: Payer: Self-pay | Admitting: Adult Health

## 2015-09-29 DIAGNOSIS — R4789 Other speech disturbances: Secondary | ICD-10-CM

## 2015-09-29 NOTE — Telephone Encounter (Signed)
Pt would like to know if a baby Asprin once a day would help with her symptoms

## 2015-09-29 NOTE — Telephone Encounter (Signed)
Spoke to patient and informed her of her MRI results. She would like to follow up with Neurology at this time

## 2015-09-30 NOTE — Telephone Encounter (Signed)
Advised to wait on the baby aspirin. I would like her to see Neuro first

## 2015-10-23 ENCOUNTER — Ambulatory Visit (INDEPENDENT_AMBULATORY_CARE_PROVIDER_SITE_OTHER): Payer: No Typology Code available for payment source | Admitting: Neurology

## 2015-10-23 ENCOUNTER — Encounter: Payer: Self-pay | Admitting: Neurology

## 2015-10-23 VITALS — BP 104/80 | HR 81 | Ht 64.0 in | Wt 177.1 lb

## 2015-10-23 DIAGNOSIS — F17200 Nicotine dependence, unspecified, uncomplicated: Secondary | ICD-10-CM

## 2015-10-23 DIAGNOSIS — G3184 Mild cognitive impairment, so stated: Secondary | ICD-10-CM | POA: Diagnosis not present

## 2015-10-23 DIAGNOSIS — F39 Unspecified mood [affective] disorder: Secondary | ICD-10-CM | POA: Diagnosis not present

## 2015-10-23 NOTE — Patient Instructions (Signed)
Your cognitive difficulty may be stemming from mood disorder or depression, so I want you to start engaging in activities that you like to do.  Explore new hobbies. Stop smoking If your problems persist, we can order neuropsychological testing.

## 2015-10-23 NOTE — Progress Notes (Signed)
[facility] - Initial Visit   Date: 10/23/2015  Haley Martin MRN: [id number] DOB: 1966/03/21   Dear Shirline Freesory Nafziger, NP:  Thank you for your kind referral of Haley Martin for consultation of word-finding difficulty. Although her history is well known to you, please allow us to reiterate it for the purpose of our medical record. The patient was accompanied to the clinic by mother who also provides collateral information.     History of Present Illness: Haley Martin is a 49 y.o. right-handed Caucasian female with ADHD, depression, hypertensive retinopathy of the right eye, and anxiety presenting for evaluation of word-finding difficulty.    Over the past 6 months, she has noticed intermittent spells of word-finding difficulty, which is steadily getting worse.  She has always had a speech impediment, but denies new slurred speech.  She is also reports passing her subdivision twice when coming home from work.  She is not getting lost.  She reports going home every day and is not motivated to do anything. 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  She manages her own finances.  Her memory can fluctuate and recently has been forgetting appointments.  She remembers things when they are on a schedule such as at work, but other day-to-day duties, she has difficulty with.  Mood is "fair", but she is tearful when saying.  She does not have any hobbies.    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  She previously used to drink 12 pack daily for three years (2008-2011), since 2011 has reduced this to now drinking 3-4 over the weekend but no longer drinks during the work week.  He used to use cocaine and marijuana during the same time.   Out-side paper records, electronic medical record, and images have  been reviewed where available and summarized as:  MRI brain wwo contrast 09/24/2015: 1. No acute intracranial abnormality or mass. 2. Mild chronic small vessel ischemic disease and cerebral atrophy.  US carotids 09/19/2015:  Normal   Lab Results  Component Value Date   TSH 0.84 09/10/2015   Lab Results  Component Value Date   VITAMINB12 1309* 09/10/2015     Past Medical History  Diagnosis Date  . Hypertension   . Anxiety and depression   . 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 Adult ADHD     Past Surgical History  Procedure Laterality Date  . Breast enhancement surgery    . Salivary gland surgery      stone   . Neck surgery    . Tmj arthroplasty    . Finger surgery      cyst     Medications:  Outpatient Encounter Prescriptions as of 10/23/2015  Medication Sig Note  . ALPRAZolam (XANAX) 1 MG tablet Take 1 mg by mouth at bedtime as needed for anxiety.   Marland Kitchen. buPROPion (WELLBUTRIN SR) 150 MG 12 hr tablet  03/25/2015: Received from: External Pharmacy  . FLUoxetine (PROZAC) 20 MG capsule Take 20 mg by mouth 2 (two) times daily.   . hydrochlorothiazide (MICROZIDE) 12.5 MG capsule Take 1 capsule (12.5 mg total) by mouth daily.   . methylphenidate (RITALIN) 20 MG tablet Take 20 mg by mouth 3 (three) times daily with meals.   . [DISCONTINUED] carvedilol (  COREG) 6.25 MG tablet Take 1 tablet (6.25 mg total) by mouth 2 (two) times daily with a meal.    No facility-administered encounter medications on file as of 10/23/2015.     Allergies:  Allergies  Allergen Reactions  . Penicillins Anaphylaxis  . Sulfa Antibiotics Anaphylaxis    Family History: Family History  Problem Relation Age of Onset  . Cancer Maternal Grandmother     colon cancer  . Stroke Father   . Hypertension Father   . Hyperlipidemia Mother   . Hyperlipidemia Father   . Thyroid disease Mother   . Healthy Sister   . Healthy Son   . Healthy Daughter     Social History: Social History    Substance Use Topics  . Smoking status: Current Some Day Smoker -- 0.20 packs/day for 1 years    Types: Cigarettes  . Smokeless tobacco: Never Used  . Alcohol Use: 0.0 oz/week    0 Standard drinks or equivalent per week     Comment: socially - previously used to drink a 12 pack a day   Social History   Social History Narrative   She works in the house cleaning business   She is divorced for 9 years   Has two children - daughter goes back and forth between        Review of Systems:  CONSTITUTIONAL: No fevers, chills, night sweats, or weight loss.   EYES: No visual changes or eye pain ENT: No hearing changes.  No history of nose bleeds.   RESPIRATORY: No cough, wheezing and shortness of breath.   CARDIOVASCULAR: Negative for chest pain, and palpitations.   GI: Negative for abdominal discomfort, blood in stools or black stools.  No recent change in bowel habits.   GU:  No history of incontinence.   MUSCLOSKELETAL: No history of joint pain or swelling.  No myalgias.   SKIN: Negative for lesions, rash, and itching.   HEMATOLOGY/ONCOLOGY: Negative for prolonged bleeding, bruising easily, and swollen nodes.  No history of cancer.   ENDOCRINE: Negative for cold or heat intolerance, polydipsia or goiter.   PSYCH:  ++depression or anxiety symptoms.   NEURO: As Above.   Vital Signs:  BP 104/80 mmHg  Pulse 81  Ht  (1.626 m)  Wt 177 lb 2 oz (80.343 kg)  BMI 30.39 kg/m2  SpO2 98% Pain Scale: 0 on a scale of 0-10   General Medical Exam:   General:  Depressed appearing, tearful during interview, comfortable.   Eyes/ENT: see cranial nerve examination.   Neck: No masses appreciated.  Full range of motion without tenderness.  No carotid bruits. Respiratory:  Clear to auscultation, good air entry bilaterally.   Cardiac:  Regular rate and rhythm, no murmur.   Extremities:  No deformities, edema, or skin discoloration.  Skin:  No rashes or lesions.  Neurological Exam: MENTAL  STATUS including orientation to time, place, person, recent and remote memory, attention span and concentration, language, and fund of knowledge is normal.  Speech is not dysarthric.  Naming, repetitive, and comprehension intact.  Montreal Cognitive Assessment  10/23/2015  Visuospatial/ Executive (0/5) 4  Naming (0/3) 3  Attention: Read list of digits (0/2) 2  Attention: Read list of letters (0/1) 1  Attention: Serial 7 subtraction starting at 100 (0/3) 3  Language: Repeat phrase (0/2) 2  Language : Fluency (0/1) 1  Abstraction (0/2) 2  Delayed Recall (0/5) 3  Orientation (0/6) 6  Total 27  Adjusted Score (based on  education) 27    CRANIAL NERVES: II:  No visual field defects.  Unremarkable fundi.   III-IV-VI: Pupils equal round and reactive to light.  Normal conjugate, extra-ocular eye movements in all directions of gaze.  No nystagmus.  No ptosis.   V:  Normal facial sensation.     VII:  Normal facial symmetry and movements.  No pathologic facial reflexes.  VIII:  Normal hearing and vestibular function.   IX-X:  Normal palatal movement.   XI:  Normal shoulder shrug and head rotation.   XII:  Normal tongue strength and range of motion, no deviation or fasciculation.  MOTOR:  No atrophy, fasciculations or abnormal movements.  No pronator drift.  Tone is normal.    Right Upper Extremity:    Left Upper Extremity:    Deltoid  5/5   Deltoid  5/5   Biceps  5/5   Biceps  5/5   Triceps  5/5   Triceps  5/5   Wrist extensors  5/5   Wrist extensors  5/5   Wrist flexors  5/5   Wrist flexors  5/5   Finger extensors  5/5   Finger extensors  5/5   Finger flexors  5/5   Finger flexors  5/5   Dorsal interossei  5/5   Dorsal interossei  5/5   Abductor pollicis  5/5   Abductor pollicis  5/5   Tone (Ashworth scale)  0  Tone (Ashworth scale)  0   Right Lower Extremity:    Left Lower Extremity:    Hip flexors  5/5   Hip flexors  5/5   Hip extensors  5/5   Hip extensors  5/5   Knee flexors  5/5    Knee flexors  5/5   Knee extensors  5/5   Knee extensors  5/5   Dorsiflexors  5/5   Dorsiflexors  5/5   Plantarflexors  5/5   Plantarflexors  5/5   Toe extensors  5/5   Toe extensors  5/5   Toe flexors  5/5   Toe flexors  5/5   Tone (Ashworth scale)  0  Tone (Ashworth scale)  0   MSRs:  Right                                                                 Left brachioradialis 2+  brachioradialis 2+  biceps 2+  biceps 2+  triceps 2+  triceps 2+  patellar 2+  patellar 2+  ankle jerk 2+  ankle jerk 2+  Hoffman no  Hoffman no  plantar response down  plantar response down   SENSORY:  Normal and symmetric perception of light touch, pinprick, vibration, and proprioception.  Romberg's sign absent.   COORDINATION/GAIT: Normal finger-to- nose-finger and heel-to-shin.  Intact rapid alternating movements bilaterally.  Able to rise from a chair without using arms.  Gait narrow based and stable. Tandem and stressed gait intact.    IMPRESSION: Mild cognitive impairment most likely due to underlying mood disorder.  I reassured patient that her MRI brain, US carotids, and lab work does not show anything worrisome.  Further, she scored well on her [facility] exam today (27/30).  No evidence of an early onset neurodegenerative condition or TIA.  She is certainly depressed and needs to  follow-up with her psychiatrist.  I encouraged her to explore new hobbies and engage in activities that she enjoys.   If symptoms do not improve or worsen, the next step will be formal neuropsychological testing  Tobacco use.   Smoking cessation instruction/counseling given:  counseled patient on the dangers of tobacco use, advised patient to stop smoking, and reviewed strategies to maximize success  Return to clinic as needed   The duration of this appointment visit was 40 minutes of face-to-face time with the patient.  Greater than 50% of this time was spent in counseling, explanation of diagnosis, planning of further  management, and coordination of care.   Thank you for allowing me to participate in patient's care.  If I can answer any additional questions, I would be pleased to do so.    Sincerely,    Tonae Livolsi K. Allena Katz, DO

## 2015-10-24 ENCOUNTER — Encounter: Payer: No Typology Code available for payment source | Admitting: Adult Health

## 2015-10-24 ENCOUNTER — Ambulatory Visit: Payer: No Typology Code available for payment source | Admitting: Adult Health

## 2015-11-12 ENCOUNTER — Ambulatory Visit (INDEPENDENT_AMBULATORY_CARE_PROVIDER_SITE_OTHER): Payer: No Typology Code available for payment source | Admitting: Adult Health

## 2015-11-12 ENCOUNTER — Encounter: Payer: Self-pay | Admitting: Adult Health

## 2015-11-12 VITALS — BP 120/84 | Temp 98.7°F | Ht 64.0 in | Wt 179.3 lb

## 2015-11-12 DIAGNOSIS — Z1211 Encounter for screening for malignant neoplasm of colon: Secondary | ICD-10-CM | POA: Diagnosis not present

## 2015-11-12 DIAGNOSIS — Z0000 Encounter for general adult medical examination without abnormal findings: Secondary | ICD-10-CM | POA: Diagnosis not present

## 2015-11-12 NOTE — Progress Notes (Signed)
Subjective:    Patient ID: Haley MichaelisLisa M Lucus, female    DOB: 02-13-1966, 49 y.o.   MRN: [id number]  HPI  Patient presents for yearly preventative medicine examination.  All immunizations and health maintenance protocols were reviewed with the patient and needed orders were placed.  Appropriate screening laboratory values were ordered for the patient including screening of hyperlipidemia, renal function and hepatic function. If indicated by BPH, a PSA was ordered.  Medication reconciliation,  past medical history, social history, problem list and allergies were reviewed in detail with the patient  Goals were established with regard to weight loss, exercise, and  diet in compliance with medications  She does monthly self breast exams, will see GYN for her PAP.   She is happy to report that she is doing much better with her depression. She is taking her medications as prescribed. She is also getting out and having fun with friends. She has renewed her relationship with her father. 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   She continues to smoke cigarettes ( about 3 a day). Does not want any additional help at this time.     Review of Systems  Constitutional: Negative.   HENT: Negative.   Cardiovascular: Negative.   Endocrine: Negative.   Genitourinary: Negative.   Musculoskeletal: Negative.   Skin: Negative.   Allergic/Immunologic: Negative.   Neurological: Negative.   Hematological: Negative.   Psychiatric/Behavioral: Negative.   All other systems reviewed and are negative.  Past Medical History  Diagnosis Date  . Hypertension   . Anxiety and depression   . Stroke [facility](HCC)     stroke in rt eye 2014  . Adult ADHD     Social History   Social History  . Marital Status: Divorced    Spouse Name: N/A  . Number of Children: N/A  . Years of Education: N/A   Occupational History  . Not on file.   Social History Main Topics    . Smoking status: Current Some Day Smoker -- 0.20 packs/day for 1 years    Types: Cigarettes  . Smokeless tobacco: Never Used  . Alcohol Use: 0.0 oz/week    0 Standard drinks or equivalent per week     Comment: socially - previously used to drink a 12 pack a day  . Drug Use: No  . Sexual Activity: Not on file   Other Topics Concern  . Not on file   Social History Narrative   She works in the house cleaning business   She is divorced for 9 years   Has two children - daughter goes back and forth between        Past Surgical History  Procedure Laterality Date  . Breast enhancement surgery    . Salivary gland surgery      stone   . Neck surgery    . Tmj arthroplasty    . Finger surgery      cyst    Family History  Problem Relation Age of Onset  . Cancer Maternal Grandmother     colon cancer  . Stroke Father   . Hypertension Father   . Hyperlipidemia Mother   . Hyperlipidemia Father   . Thyroid disease Mother   . Healthy Sister   . Healthy Son   . Healthy Daughter     Allergies  Allergen Reactions  . Penicillins Anaphylaxis  . Sulfa Antibiotics Anaphylaxis    Current Outpatient Prescriptions on  File Prior to Visit  Medication Sig Dispense Refill  . ALPRAZolam (XANAX) 1 MG tablet Take 1 mg by mouth at bedtime as needed for anxiety.    Marland Kitchen buPROPion (WELLBUTRIN SR) 150 MG 12 hr tablet Take 150 mg by mouth 2 (two) times daily.     . hydrochlorothiazide (MICROZIDE) 12.5 MG capsule Take 1 capsule (12.5 mg total) by mouth daily. 30 capsule 3  . methylphenidate (RITALIN) 20 MG tablet Take 20 mg by mouth 3 (three) times daily with meals.     No current facility-administered medications on file prior to visit.    BP 120/84 mmHg  Temp(Src) 98.7 F (37.1 C) (Oral)  Ht  (1.626 m)  Wt 179 lb 4.8 oz (81.33 kg)  BMI 30.76 kg/m2       Objective:   Physical Exam  Constitutional: She is oriented to person, place, and time. She appears well-developed and  well-nourished. No distress.  HENT:  Head: Normocephalic and atraumatic.  Right Ear: External ear normal.  Left Ear: External ear normal.  Nose: Nose normal.  Mouth/Throat: Oropharynx is clear and moist. No oropharyngeal exudate.  Eyes: Conjunctivae and EOM are normal. Pupils are equal, round, and reactive to light. Right eye exhibits no discharge. Left eye exhibits no discharge. No scleral icterus.  Neck: Normal range of motion. Neck supple. No JVD present. No tracheal deviation present. No thyromegaly present.  Cardiovascular: Normal rate, regular rhythm, normal heart sounds and intact distal pulses.  Exam reveals no gallop and no friction rub.   No murmur heard. Pulmonary/Chest: Effort normal and breath sounds normal. No stridor. No respiratory distress. She has no wheezes. She has no rales. She exhibits no tenderness.  Abdominal: Soft. Bowel sounds are normal. She exhibits no distension and no mass. There is no tenderness. There is no rebound and no guarding.  Genitourinary:  Refused: She is going to GYN Breast Exam: Implants noted. No masses, lumps, dimpling or discharge.   Musculoskeletal: Normal range of motion. She exhibits no edema or tenderness.  Lymphadenopathy:    She has no cervical adenopathy.  Neurological: She is alert and oriented to person, place, and time.  Skin: Skin is warm and dry. No rash noted. She is not diaphoretic. No erythema. No pallor.  Psychiatric: She has a normal mood and affect. Her behavior is normal. Judgment and thought content normal.  Vitals reviewed.      Assessment & Plan:  1. Routine general medical examination at a health care facility - Ambulatory referral to Gastroenterology - Basic metabolic panel; Future - CBC with Differential/Platelet; Future - Hemoglobin A1c; Future - Hepatic function panel; Future - Lipid panel; Future - POCT urinalysis dipstick; Future - TSH; Future - She knows that she needs to quit smoking. Advised to let me  know if she wants help.  - Follow up for labs - Follow up in one year or sooner if needed.   2. Colon cancer screening - Ambulatory referral to Gastroenterology

## 2015-11-12 NOTE — Patient Instructions (Addendum)
It was great seeing you again! Haley Martin, you look amazing! Keep it up!  I will follow up with you regarding your blood work.   You can follow up with me in one year or sooner if needed  Have a great Holiday Season!  Health Maintenance, Female Adopting a healthy lifestyle and getting preventive care can go a long way to promote health and wellness. Talk with your health care provider about what schedule of regular examinations is right for you. This is a good chance for you to check in with your provider about disease prevention and staying healthy. In between checkups, there are plenty of things you can do on your own. Experts have done a lot of research about which lifestyle changes and preventive measures are most likely to keep you healthy. Ask your health care provider for more information. WEIGHT AND DIET  Eat a healthy diet  Be sure to include plenty of vegetables, fruits, low-fat dairy products, and lean protein.  Do not eat a lot of foods high in solid fats, added sugars, or salt.  Get regular exercise. This is one of the most important things you can do for your health.  Most adults should exercise for at least 150 minutes each week. The exercise should increase your heart rate and make you sweat (moderate-intensity exercise).  Most adults should also do strengthening exercises at least twice a week. This is in addition to the moderate-intensity exercise.  Maintain a healthy weight  Body mass index (BMI) is a measurement that can be used to identify possible weight problems. It estimates body fat based on height and weight. Your health care provider can help determine your BMI and help you achieve or maintain a healthy weight.  For females 47 years of age and older:   A BMI below 18.5 is considered underweight.  A BMI of 18.5 to 24.9 is normal.  A BMI of 25 to 29.9 is considered overweight.  A BMI of 30 and above is considered obese.  Watch levels of cholesterol and blood  lipids  You should start having your blood tested for lipids and cholesterol at 51 years of age, then have this test every 5 years.  You may need to have your cholesterol levels checked more often if:  Your lipid or cholesterol levels are high.  You are older than 51 years of age.  You are at high risk for heart disease.  CANCER SCREENING   Lung Cancer  Lung cancer screening is recommended for adults 37-56 years old who are at high risk for lung cancer because of a history of smoking.  A yearly low-dose CT scan of the lungs is recommended for people who:  Currently smoke.  Have quit within the past 15 years.  Have at least a 30-pack-year history of smoking. A pack year is smoking an average of one pack of cigarettes a day for 1 year.  Yearly screening should continue until it has been 15 years since you quit.  Yearly screening should stop if you develop a health problem that would prevent you from having lung cancer treatment.  Breast Cancer  Practice breast self-awareness. This means understanding how your breasts normally appear and feel.  It also means doing regular breast self-exams. Let your health care provider know about any changes, no matter how small.  If you are in your 20s or 30s, you should have a clinical breast exam (CBE) by a health care provider every 1-3 years as part of a  regular health exam.  If you are 40 or older, have a CBE every year. Also consider having a breast X-ray (mammogram) every year.  If you have a family history of breast cancer, talk to your health care provider about genetic screening.  If you are at high risk for breast cancer, talk to your health care provider about having an MRI and a mammogram every year.  Breast cancer gene (BRCA) assessment is recommended for women who have family members with BRCA-related cancers. BRCA-related cancers include:  Breast.  Ovarian.  Tubal.  Peritoneal cancers.  Results of the assessment  will determine the need for genetic counseling and BRCA1 and BRCA2 testing. Cervical Cancer Your health care provider may recommend that you be screened regularly for cancer of the pelvic organs (ovaries, uterus, and vagina). This screening involves a pelvic examination, including checking for microscopic changes to the surface of your cervix (Pap test). You may be encouraged to have this screening done every 3 years, beginning at age 84.  For women ages 99-65, health care providers may recommend pelvic exams and Pap testing every 3 years, or they may recommend the Pap and pelvic exam, combined with testing for human papilloma virus (HPV), every 5 years. Some types of HPV increase your risk of cervical cancer. Testing for HPV may also be done on women of any age with unclear Pap test results.  Other health care providers may not recommend any screening for nonpregnant women who are considered low risk for pelvic cancer and who do not have symptoms. Ask your health care provider if a screening pelvic exam is right for you.  If you have had past treatment for cervical cancer or a condition that could lead to cancer, you need Pap tests and screening for cancer for at least 20 years after your treatment. If Pap tests have been discontinued, your risk factors (such as having a new sexual partner) need to be reassessed to determine if screening should resume. Some women have medical problems that increase the chance of getting cervical cancer. In these cases, your health care provider may recommend more frequent screening and Pap tests. Colorectal Cancer  This type of cancer can be detected and often prevented.  Routine colorectal cancer screening usually begins at 51 years of age and continues through 51 years of age.  Your health care provider may recommend screening at an earlier age if you have risk factors for colon cancer.  Your health care provider may also recommend using home test kits to check  for hidden blood in the stool.  A small camera at the end of a tube can be used to examine your colon directly (sigmoidoscopy or colonoscopy). This is done to check for the earliest forms of colorectal cancer.  Routine screening usually begins at age 6.  Direct examination of the colon should be repeated every 5-10 years through 51 years of age. However, you may need to be screened more often if early forms of precancerous polyps or small growths are found. Skin Cancer  Check your skin from head to toe regularly.  Tell your health care provider about any new moles or changes in moles, especially if there is a change in a mole's shape or color.  Also tell your health care provider if you have a mole that is larger than the size of a pencil eraser.  Always use sunscreen. Apply sunscreen liberally and repeatedly throughout the day.  Protect yourself by wearing long sleeves, pants, a wide-brimmed hat,  and sunglasses whenever you are outside. HEART DISEASE, DIABETES, AND HIGH BLOOD PRESSURE   High blood pressure causes heart disease and increases the risk of stroke. High blood pressure is more likely to develop in:  People who have blood pressure in the high end of the normal range (130-139/85-89 mm Hg).  People who are overweight or obese.  People who are African American.  If you are 39-52 years of age, have your blood pressure checked every 3-5 years. If you are 19 years of age or older, have your blood pressure checked every year. You should have your blood pressure measured twice--once when you are at a hospital or clinic, and once when you are not at a hospital or clinic. Record the average of the two measurements. To check your blood pressure when you are not at a hospital or clinic, you can use:  An automated blood pressure machine at a pharmacy.  A home blood pressure monitor.  If you are between 31 years and 2 years old, ask your health care provider if you should take  aspirin to prevent strokes.  Have regular diabetes screenings. This involves taking a blood sample to check your fasting blood sugar level.  If you are at a normal weight and have a low risk for diabetes, have this test once every three years after 51 years of age.  If you are overweight and have a high risk for diabetes, consider being tested at a younger age or more often. PREVENTING INFECTION  Hepatitis B  If you have a higher risk for hepatitis B, you should be screened for this virus. You are considered at high risk for hepatitis B if:  You were born in a country where hepatitis B is common. Ask your health care provider which countries are considered high risk.  Your parents were born in a high-risk country, and you have not been immunized against hepatitis B (hepatitis B vaccine).  You have HIV or AIDS.  You use needles to inject street drugs.  You live with someone who has hepatitis B.  You have had sex with someone who has hepatitis B.  You get hemodialysis treatment.  You take certain medicines for conditions, including cancer, organ transplantation, and autoimmune conditions. Hepatitis C  Blood testing is recommended for:  Everyone born from [birth year] through 1967.  Anyone with known risk factors for hepatitis C. Sexually transmitted infections (STIs)  You should be screened for sexually transmitted infections (STIs) including gonorrhea and chlamydia if:  You are sexually active and are younger than 51 years of age.  You are older than 51 years of age and your health care provider tells you that you are at risk for this type of infection.  Your sexual activity has changed since you were last screened and you are at an increased risk for chlamydia or gonorrhea. Ask your health care provider if you are at risk.  If you do not have HIV, but are at risk, it may be recommended that you take a prescription medicine daily to prevent HIV infection. This is called  pre-exposure prophylaxis (PrEP). You are considered at risk if:  You are sexually active and do not regularly use condoms or know the HIV status of your partner(s).  You take drugs by injection.  You are sexually active with a partner who has HIV. Talk with your health care provider about whether you are at high risk of being infected with HIV. If you choose to begin PrEP, you should  first be tested for HIV. You should then be tested every 3 months for as long as you are taking PrEP.  PREGNANCY   If you are premenopausal and you may become pregnant, ask your health care provider about preconception counseling.  If you may become pregnant, take 400 to 800 micrograms (mcg) of folic acid every day.  If you want to prevent pregnancy, talk to your health care provider about birth control (contraception). OSTEOPOROSIS AND MENOPAUSE   Osteoporosis is a disease in which the bones lose minerals and strength with aging. This can result in serious bone fractures. Your risk for osteoporosis can be identified using a bone density scan.  If you are 2 years of age or older, or if you are at risk for osteoporosis and fractures, ask your health care provider if you should be screened.  Ask your health care provider whether you should take a calcium or vitamin D supplement to lower your risk for osteoporosis.  Menopause may have certain physical symptoms and risks.  Hormone replacement therapy may reduce some of these symptoms and risks. Talk to your health care provider about whether hormone replacement therapy is right for you.  HOME CARE INSTRUCTIONS   Schedule regular health, dental, and eye exams.  Stay current with your immunizations.   Do not use any tobacco products including cigarettes, chewing tobacco, or electronic cigarettes.  If you are pregnant, do not drink alcohol.  If you are breastfeeding, limit how much and how often you drink alcohol.  Limit alcohol intake to no more than 1  drink per day for nonpregnant women. One drink equals 12 ounces of beer, 5 ounces of wine, or 1 ounces of hard liquor.  Do not use street drugs.  Do not share needles.  Ask your health care provider for help if you need support or information about quitting drugs.  Tell your health care provider if you often feel depressed.  Tell your health care provider if you have ever been abused or do not feel safe at home.   This information is not intended to replace advice given to you by your health care provider. Make sure you discuss any questions you have with your health care provider.   Document Released: 06/14/2011 Document Revised: 12/20/2014 Document Reviewed: 10/31/2013 Elsevier Interactive Patient Education [employer].

## 2015-11-12 NOTE — Progress Notes (Signed)
Pre visit review using our clinic review tool, if applicable. No additional management support is needed unless otherwise documented below in the visit note. 

## 2015-11-12 NOTE — Addendum Note (Signed)
Addended by: Shirline FreesNAFZIGER, Geralyn Figiel L on: 11/12/2015 05:00 PM   Modules accepted: Orders, SmartSet

## 2015-11-17 ENCOUNTER — Encounter: Payer: Self-pay | Admitting: Gastroenterology

## 2015-11-18 ENCOUNTER — Other Ambulatory Visit: Payer: No Typology Code available for payment source

## 2015-11-18 ENCOUNTER — Other Ambulatory Visit: Payer: Self-pay | Admitting: Adult Health

## 2015-12-31 ENCOUNTER — Other Ambulatory Visit: Payer: No Typology Code available for payment source

## 2016-01-13 ENCOUNTER — Encounter: Payer: No Typology Code available for payment source | Admitting: Gastroenterology

## 2016-02-13 ENCOUNTER — Encounter: Payer: Self-pay | Admitting: Adult Health

## 2016-02-13 ENCOUNTER — Ambulatory Visit (INDEPENDENT_AMBULATORY_CARE_PROVIDER_SITE_OTHER): Payer: BLUE CROSS/BLUE SHIELD | Admitting: Adult Health

## 2016-02-13 ENCOUNTER — Telehealth: Payer: Self-pay | Admitting: Adult Health

## 2016-02-13 VITALS — BP 92/70 | HR 75 | Temp 98.1°F | Ht 64.0 in | Wt 180.2 lb

## 2016-02-13 DIAGNOSIS — R0602 Shortness of breath: Secondary | ICD-10-CM | POA: Diagnosis not present

## 2016-02-13 DIAGNOSIS — R42 Dizziness and giddiness: Secondary | ICD-10-CM | POA: Diagnosis not present

## 2016-02-13 LAB — GLUCOSE, POCT (MANUAL RESULT ENTRY): POC GLUCOSE: 96 mg/dL (ref 70–99)

## 2016-02-13 NOTE — Telephone Encounter (Signed)
Patient Name: Clint GuyLISA Presutti DOB: [birth year] Initial Comment caller states her BP is 92/70, no sx Nurse Assessment Nurse: Elijah Birkaldwell, RN, Lynda Date/Time (Eastern Time): 02/13/2016 9:10:33 AM Confirm and document reason for call. If symptomatic, describe symptoms. You must click the next button to save text entered. ---Caller states her BP is 92/70, no symptoms. Is on a BP rx twice a day. Her father had a stroke, is under stress/bringing him home. Has the patient traveled out of the country within the last 30 days? ---Not Applicable Does the patient have any new or worsening symptoms? ---Yes Will a triage be completed? ---Yes Related visit to physician within the last 2 weeks? ---No Does the PT have any chronic conditions? (i.e. diabetes, asthma, etc.) ---Yes List chronic conditions. ---on BP rx, depression Is the patient pregnant or possibly pregnant? (Ask all females between the ages of 6812-55) ---No Is this a behavioral health or substance abuse call? ---No Guidelines Guideline Title Affirmed Question Affirmed Notes Low Blood Pressure [1] Systolic BP 90-110 AND [2] taking blood pressure medications AND [3] NOT dizzy, lightheaded or weak Final Disposition User See Physician within 24 Hours Ogdenaldwell, RN, ToysRusLynda Referrals REFERRED TO PCP OFFICE Disagree/Comply: Danella Maiersomply

## 2016-02-13 NOTE — Telephone Encounter (Signed)
Pt has appt today at 10:15 am.

## 2016-02-13 NOTE — Patient Instructions (Signed)
It was great seeing you again today!  I am sorry you are having to go through with this.   Get some food, fluids and rest.   Follow up if no improvement in your symptoms.

## 2016-02-13 NOTE — Progress Notes (Signed)
Subjective:    Patient ID: Haley Martin, female    DOB: 10-13-1966, 50 y.o.   MRN: [id number]  HPI  50 year old female who presents to the office today for an acute complaint. She reports that this morning she checked her blood pressure and it was low, in the 100's/70's. She then noticed that she had a headache, felt short of breath and experienced numbness and coldness in her right hand.   She reports that her father had a stroke on Feb 1st and has been in the hospital until March 1st, he is currently home. Haley Martin has been spending a lot of time at the hospital and at her fathers house. She has not been sleeping, has not been eating anything, besides pop corn and  Diet soda. She reports that she has not had anything with substance in close to a week and has not slept in three days.   She denies any slurred speech, facial droop, blurred vision, or decreased sensation.   Review of Systems  Constitutional: Positive for activity change and appetite change. Negative for fever, chills, diaphoresis and unexpected weight change.  Respiratory: Positive for shortness of breath. Negative for apnea, cough, choking, chest tightness and wheezing.   Cardiovascular: Negative.   Gastrointestinal: Negative.   Skin: Negative.   Neurological: Positive for dizziness and headaches. Negative for tremors, speech difficulty, weakness, light-headedness and numbness.  Hematological: Negative.   All other systems reviewed and are negative.  Past Medical History  Diagnosis Date  . Hypertension   . Anxiety and depression   . 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 Adult ADHD     Social History   Social History  . Marital Status: Divorced    Spouse Name: N/A  . Number of Children: N/A  . Years of Education: N/A   Occupational History  . Not on file.   Social History Main Topics  . Smoking status: Current Some Day Smoker -- 0.20 packs/day for 1 years    Types: Cigarettes  . Smokeless tobacco: Never  Used  . Alcohol Use: 0.0 oz/week    0 Standard drinks or equivalent per week     Comment: socially - previously used to drink a 12 pack a day  . Drug Use: No  . Sexual Activity: Not on file   Other Topics Concern  . Not on file   Social History Narrative   She works in the house cleaning business   She is divorced for 9 years   Has two children - daughter goes back and forth between        Past Surgical History  Procedure Laterality Date  . Breast enhancement surgery    . Salivary gland surgery      stone   . Neck surgery    . Tmj arthroplasty    . Finger surgery      cyst    Family History  Problem Relation Age of Onset  . Cancer Maternal Grandmother     colon cancer  . Stroke Father   . Hypertension Father   . Hyperlipidemia Mother   . Hyperlipidemia Father   . Thyroid disease Mother   . Healthy Sister   . Healthy Son   . Healthy Daughter     Allergies  Allergen Reactions  . Penicillins Anaphylaxis  . Sulfa Antibiotics Anaphylaxis    Current Outpatient Prescriptions on File Prior to Visit  Medication Sig Dispense Refill  .  ALPRAZolam (XANAX) 1 MG tablet Take 1 mg by mouth at bedtime as needed for anxiety.    Marland Kitchen buPROPion (WELLBUTRIN SR) 150 MG 12 hr tablet Take 150 mg by mouth 2 (two) times daily.     Marland Kitchen FLUoxetine (PROZAC) 40 MG capsule Take 1 tablet 3 times a day    . hydrochlorothiazide (MICROZIDE) 12.5 MG capsule TAKE 1 CAPSULE (12.5 MG TOTAL) BY MOUTH DAILY. 30 capsule 11   No current facility-administered medications on file prior to visit.    BP 92/70 mmHg  Pulse 75  Temp(Src) 98.1 F (36.7 C) (Oral)  Ht  (1.626 m)  Wt 180 lb 3.2 oz (81.738 kg)  BMI 30.92 kg/m2  SpO2 95%       Objective:   Physical Exam  Constitutional: She is oriented to person, place, and time. She appears well-developed and well-nourished. No distress.  HENT:  Head: Normocephalic and atraumatic.  Right Ear: External ear normal.  Left Ear: External ear normal.    Nose: Nose normal.  Mouth/Throat: Oropharynx is clear and moist. No oropharyngeal exudate.  Eyes: Conjunctivae and EOM are normal. Pupils are equal, round, and reactive to light. Right eye exhibits no discharge. Left eye exhibits no discharge.  Neck: Normal range of motion.  Cardiovascular: Normal rate, regular rhythm, normal heart sounds and intact distal pulses.   No murmur heard. Pulmonary/Chest: Effort normal and breath sounds normal. No respiratory distress. She has no wheezes. She has no rales. She exhibits no tenderness.  Lymphadenopathy:    She has no cervical adenopathy.  Neurological: She is alert and oriented to person, place, and time. She has normal strength and normal reflexes. She displays no atrophy, no tremor and normal reflexes. No cranial nerve deficit or sensory deficit. She exhibits normal muscle tone. She displays no seizure activity. Coordination and gait normal. GCS eye subscore is 4. GCS verbal subscore is 5. GCS motor subscore is 6.  Reflex Scores:      Tricep reflexes are 2+ on the right side and 2+ on the left side.      Bicep reflexes are 2+ on the right side and 2+ on the left side.      Brachioradialis reflexes are 2+ on the right side and 2+ on the left side.      Patellar reflexes are 2+ on the right side and 2+ on the left side.      Achilles reflexes are 2+ on the right side and 2+ on the left side. Skin: Skin is warm and dry. No rash noted. She is not diaphoretic. No erythema. No pallor.  Psychiatric: She has a normal mood and affect. Her behavior is normal. Judgment and thought content normal.  Nursing note and vitals reviewed.     Assessment & Plan:  1. SOB (shortness of breath) - EKG 12-Lead- Sinus  Rhythm  -Poor R-wave progression -nonspecific -consider old anterior infarct.   -  Negative precordial T-waves. Rate 68  2. Dizziness - Likely due to low BP, secondary to decreased PO intake and lack of sleep.  - POCT glucose (manual entry) - She was  given a bottle of water and was able to finish it during her exam. After drinking she states that she felt better. Her BP was up to 118/78 prior to leaving.  - She had no facial droop or slurred speech, she had equal sensation throughout and had no pronator drift. She does not appear to be having a stroke.  - Advised to go  straight home and rest ( she was no longer dizzy when leaving the office).  - Needs to eat healthy food and increase fluids.  - Follow up if no improvement.  - Go to the ER with worsening symptoms.

## 2016-02-13 NOTE — Progress Notes (Signed)
Pre visit review using our clinic review tool, if applicable. No additional management support is needed unless otherwise documented below in the visit note. 

## 2016-10-15 ENCOUNTER — Other Ambulatory Visit: Payer: Self-pay | Admitting: Adult Health

## 2016-10-15 NOTE — Telephone Encounter (Signed)
Ok to refill for 90 +1 

## 2016-10-15 NOTE — Telephone Encounter (Signed)
Ok to refill 

## 2017-02-04 ENCOUNTER — Telehealth: Payer: Self-pay | Admitting: Emergency Medicine

## 2017-02-04 NOTE — Telephone Encounter (Signed)
Patient called stating that she would like to establish care with Dr. Milinda CaveMcGowen. Dr. Alfonse FlavorsGowen see's her father and [place] in [place] and it will work best for the both of them. Okay for patient to establish care?

## 2017-02-04 NOTE — Telephone Encounter (Signed)
OK with me.

## 2017-02-07 NOTE — Telephone Encounter (Signed)
Pt scheduled for 02/22/17.

## 2017-02-07 NOTE — Telephone Encounter (Signed)
Ok with me 

## 2017-02-07 NOTE — Telephone Encounter (Signed)
OK with me.

## 2017-02-22 ENCOUNTER — Ambulatory Visit (INDEPENDENT_AMBULATORY_CARE_PROVIDER_SITE_OTHER): Payer: BLUE CROSS/BLUE SHIELD | Admitting: Family Medicine

## 2017-02-22 ENCOUNTER — Encounter: Payer: Self-pay | Admitting: Family Medicine

## 2017-02-22 VITALS — BP 121/89 | HR 112 | Temp 98.2°F | Resp 16 | Ht 64.0 in | Wt 170.2 lb

## 2017-02-22 DIAGNOSIS — R000 Tachycardia, unspecified: Secondary | ICD-10-CM

## 2017-02-22 DIAGNOSIS — H34231 Retinal artery branch occlusion, right eye: Secondary | ICD-10-CM | POA: Diagnosis not present

## 2017-02-22 DIAGNOSIS — I10 Essential (primary) hypertension: Secondary | ICD-10-CM | POA: Diagnosis not present

## 2017-02-22 MED ORDER — HYDROCHLOROTHIAZIDE 12.5 MG PO CAPS: ORAL_CAPSULE | ORAL | 1 refills | Status: DC

## 2017-02-22 NOTE — Progress Notes (Signed)
Pre visit review using our clinic review tool, if applicable. No additional management support is needed unless otherwise documented below in the visit note. 

## 2017-02-22 NOTE — Progress Notes (Addendum)
Office Note 02/22/2017  CC:  Chief Complaint  Patient presents with  . Establish Care    transfer from [facility]  . Follow-up    HTN and HR   HPI:  Haley Martin is a 51 y.o.  female who is here to establish care Patient's most recent primary MD: Shirline Freesory Nafziger, NP at [facility].  Dr. Evelene CroonKaur is psychiatrist. Old records in EPIC/HL EMR were reviewed prior to or during today's visit.  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 Within the last 6 mo she has been on a little lower dose of adderall: 20mg  tid instead of 30mg  tid--this change was due to pt's worry about her HR.    Past Medical History:  Diagnosis Date  . Adult ADHD   . Anxiety and depression   . History of chicken pox   . Hypertension    Carvedolol caused hypotension  . Stroke [facility](HCC)    stroke in rt eye 2014 (hypertensive)    Past Surgical History:  Procedure Laterality Date  . BREAST ENHANCEMENT SURGERY    . FINGER SURGERY     cyst  . NECK SURGERY  REMOTE past   lymph node excision--benign per pt report  . SALIVARY GLAND SURGERY     stone   . TMJ ARTHROPLASTY      Family History  Problem Relation Age of Onset  . Hyperlipidemia Mother   . Thyroid disease Mother   . Stroke Father   . Hypertension Father   . Hyperlipidemia Father   . Cancer Maternal Grandmother     colon cancer  . Colon cancer Maternal Grandmother   . Healthy Sister   . Healthy Son   . Healthy Daughter   . Colon cancer Maternal Uncle   . Breast cancer Paternal Aunt   . Lung cancer Paternal Aunt   . Bone cancer Paternal Aunt   . Colon cancer Maternal Uncle     Social History   Social History  . Marital status: Divorced    Spouse name: N/A  . Number of children: N/A  . Years of education: N/A   Occupational History  . Not on file.   Social History Main Topics  . Smoking status: Current Some Day Smoker    Packs/day: 0.20    Years: 2.00    Types:  Cigarettes  . Smokeless tobacco: Never Used  . Alcohol use 0.0 oz/week     Comment: 1-3 beers a day  . Drug use: No  . Sexual activity: Not on file   Other Topics Concern  . Not on file   Social History Narrative   She works in the house cleaning business   She is divorced for 9 years   Has two children - daughter goes back and forth between.   Tob: 1 pack q 3d--current as of 02/2017.   Alc: 12 pack per week.       Outpatient Encounter Prescriptions as of 02/22/2017  Medication Sig  . ALPRAZolam (XANAX) 1 MG tablet Take 1 mg by mouth at bedtime as needed for anxiety.  Marland Kitchen. amphetamine-dextroamphetamine (ADDERALL) 20 MG tablet Take 20 mg by mouth 3 (three) times daily.  Marland Kitchen. buPROPion (WELLBUTRIN SR) 150 MG 12 hr tablet Take 150 mg by mouth 2 (two) times daily.   Marland Kitchen. FLUoxetine (PROZAC) 20 MG tablet Take 20 mg by mouth 3 (three) times daily.  . hydrochlorothiazide (MICROZIDE) 12.5 MG capsule TAKE 1 CAPSULE (12.5  MG TOTAL) BY MOUTH DAILY.  . [DISCONTINUED] hydrochlorothiazide (MICROZIDE) 12.5 MG capsule TAKE 1 CAPSULE (12.5 MG TOTAL) BY MOUTH DAILY.  . [DISCONTINUED] amphetamine-dextroamphetamine (ADDERALL XR) 30 MG 24 hr capsule Take 30 mg by mouth daily.  . [DISCONTINUED] FLUoxetine (PROZAC) 40 MG capsule Take 1 tablet 3 times a day   No facility-administered encounter medications on file as of 02/22/2017.     Allergies  Allergen Reactions  . Penicillins Anaphylaxis  . Sulfa Antibiotics Anaphylaxis    ROS Review of Systems  Constitutional: Negative for fatigue and fever.  HENT: Negative for congestion and sore throat.   Eyes: Negative for pain.  Respiratory: Negative for cough.   Cardiovascular: Negative for chest pain.  Gastrointestinal: Negative for abdominal pain and nausea.  Genitourinary: Negative for dysuria.  Musculoskeletal: Negative for back pain and joint swelling.  Skin: Negative for rash.  Neurological: Negative for weakness and headaches.  Hematological: Negative  for adenopathy.    PE; Blood pressure 121/89, pulse (!) 112, temperature 98.2 F (36.8 C), temperature source Oral, resp. rate 16, height 5\' 4"  (1.626 m), weight 170 lb 4 oz (77.2 kg), SpO2 97 %. Gen: Alert, well appearing.  Patient is oriented to person, place, time, and situation. AFFECT: pleasant, lucid thought and speech. WUJ:WJXB: no injection, icteris, swelling, or exudate.  EOMI, PERRLA. Mouth: lips without lesion/swelling.  Oral mucosa pink and moist. Oropharynx without erythema, exudate, or swelling.  Neck - No masses or thyromegaly or limitation in range of motion CV: RRR--rate 100 by me, no m/r/g.   LUNGS: CTA bilat, nonlabored resps, good aeration in all lung fields. EXT: no clubbing, cyanosis, or edema.   Pertinent labs:  Lab Results  Component Value Date   TSH 0.84 09/10/2015   Lab Results  Component Value Date   WBC 11.2 (H) 09/10/2015   HGB 15.9 (H) 09/10/2015   HCT 47.3 (H) 09/10/2015   MCV 96.0 09/10/2015   PLT 318.0 09/10/2015   Lab Results  Component Value Date   CREATININE 0.69 09/10/2015   BUN 9 09/10/2015   NA 137 09/10/2015   K 4.0 09/10/2015   CL 99 09/10/2015   CO2 30 09/10/2015    ASSESSMENT AND PLAN:   New/transfer pt: old records in EMR.  1) HTN; well controlled, RF'd hctz 12.5mg  qd today. Mild elevation of HR at times most consistent with pt's medication regimen, plus her highest HR was done while working cleaning a house, which is normal.  Reassured pt today.  Will be checking TSH at CPE visit in 1 mo.  2) Hx of right eye CVA--presumed retinal artery occlusion but ? Hemorrhagic due to uncontrolled HTN.  She saw Dr. Luciana Axe in the past and got eye injections, and due to insurance reasons she needs referral order to continue seeing him---ordered today. BP control is good.  Hx of retinal bleeding (?) per pt, so no ASA recommended for CVA secondary prevention.  3) Anxiety, depression, adult ADHD: followed by Dr. Evelene Croon in psychiatry for these  dx's/meds.  An After Visit Summary was printed and given to the patient.  Return in about 4 weeks (around 03/22/2017) for annual CPE (fasting).  Will obtain health panel labs and she'll need referral to GYN (cerv ca scr, etc), and GI (initial colon cancer screening).  Signed:  Santiago Bumpers, MD           02/22/2017   ADDENDUM 02/23/17: reviewed Dr. Ephriam Knuckles records. Unclear mention of possible hx of graves dz in patient.  Need  to clarify when pt returns for CPE.

## 2017-02-23 ENCOUNTER — Encounter: Payer: Self-pay | Admitting: Family Medicine

## 2017-03-18 ENCOUNTER — Encounter: Payer: BLUE CROSS/BLUE SHIELD | Admitting: Family Medicine

## 2017-03-18 NOTE — Progress Notes (Deleted)
Office Note 03/18/2017  CC: No chief complaint on file.   HPI:  Haley Martin is a 51 y.o. female who is here for annual health maintenance exam.   Past Medical History:  Diagnosis Date  . Adult ADHD    managed by Dr. Evelene Croon  . Anxiety and depression    Managed by Dr. Evelene Croon.  Marland Kitchen BRVO (branch retinal vein occlusion) 2014   R eye 2014--followed by Dr. Luciana Axe.  MRI brain 11/19/15 showed mildly advanced generalized atrophy for age, +chronic small vessel ischemic changes, no acute finding.  Marland Kitchen Hypertension    Carvedolol caused hypotension    Past Surgical History:  Procedure Laterality Date  . BREAST ENHANCEMENT SURGERY    . Carotid dopplers  09/19/2015   NORMAL  . FINGER SURGERY     cyst  . NECK SURGERY  REMOTE past   lymph node excision--benign per pt report  . SALIVARY GLAND SURGERY     stone   . TMJ ARTHROPLASTY      Family History  Problem Relation Age of Onset  . Hyperlipidemia Mother   . Thyroid disease Mother   . Stroke Father   . Hypertension Father   . Hyperlipidemia Father   . Cancer Maternal Grandmother     colon cancer  . Colon cancer Maternal Grandmother   . Healthy Sister   . Healthy Son   . Healthy Daughter   . Colon cancer Maternal Uncle   . Breast cancer Paternal Aunt   . Lung cancer Paternal Aunt   . Bone cancer Paternal Aunt   . Colon cancer Maternal Uncle     Social History   Social History  . Marital status: Divorced    Spouse name: N/A  . Number of children: N/A  . Years of education: N/A   Occupational History  . Not on file.   Social History Main Topics  . Smoking status: Current Some Day Smoker    Packs/day: 0.20    Years: 2.00    Types: Cigarettes  . Smokeless tobacco: Never Used  . Alcohol use 0.0 oz/week     Comment: 1-3 beers a day  . Drug use: No  . Sexual activity: Not on file   Other Topics Concern  . Not on file   Social History Narrative   She works in the house cleaning business   She is divorced for 9  years   Has two children - daughter goes back and forth between.   Tob: 1 pack q 3d--current as of 02/2017.   Alc: 12 pack per week.       Outpatient Medications Prior to Visit  Medication Sig Dispense Refill  . ALPRAZolam (XANAX) 1 MG tablet Take 1 mg by mouth at bedtime as needed for anxiety.    Marland Kitchen amphetamine-dextroamphetamine (ADDERALL) 20 MG tablet Take 20 mg by mouth 3 (three) times daily.    Marland Kitchen buPROPion (WELLBUTRIN SR) 150 MG 12 hr tablet Take 150 mg by mouth 2 (two) times daily.     Marland Kitchen FLUoxetine (PROZAC) 20 MG tablet Take 20 mg by mouth 3 (three) times daily.    . hydrochlorothiazide (MICROZIDE) 12.5 MG capsule TAKE 1 CAPSULE (12.5 MG TOTAL) BY MOUTH DAILY. 90 capsule 1   No facility-administered medications prior to visit.     Allergies  Allergen Reactions  . Penicillins Anaphylaxis  . Sulfa Antibiotics Anaphylaxis    ROS *** PE; There were no vitals taken for this visit. *** Pertinent labs:  Lab Results  Component Value Date   TSH 0.84 09/10/2015   Lab Results  Component Value Date   WBC 11.2 (H) 09/10/2015   HGB 15.9 (H) 09/10/2015   HCT 47.3 (H) 09/10/2015   MCV 96.0 09/10/2015   PLT 318.0 09/10/2015   Lab Results  Component Value Date   CREATININE 0.69 09/10/2015   BUN 9 09/10/2015   NA 137 09/10/2015   K 4.0 09/10/2015   CL 99 09/10/2015   CO2 30 09/10/2015    ASSESSMENT AND PLAN:   Health maintenance exam: Reviewed age and gender appropriate health maintenance issues (prudent diet, regular exercise, health risks of tobacco and excessive alcohol, use of seatbelts, fire alarms in home, use of sunscreen).  Also reviewed age and gender appropriate health screening as well as vaccine recommendations. Fasting HP labs today. Vaccines: Tdap due. Breast ca screening: mammogram Cervical ca screening: Colon cancer screening:  An After Visit Summary was printed and given to the patient.  FOLLOW UP:  No Follow-up on file.  Signed:  Santiago Bumpers, MD            03/18/2017

## 2017-05-13 DIAGNOSIS — N950 Postmenopausal bleeding: Secondary | ICD-10-CM

## 2017-05-13 HISTORY — DX: Postmenopausal bleeding: N95.0

## 2017-06-01 ENCOUNTER — Encounter: Payer: Self-pay | Admitting: Family Medicine

## 2017-06-01 ENCOUNTER — Ambulatory Visit (INDEPENDENT_AMBULATORY_CARE_PROVIDER_SITE_OTHER): Payer: BLUE CROSS/BLUE SHIELD | Admitting: Family Medicine

## 2017-06-01 VITALS — BP 131/87 | HR 94 | Temp 98.2°F | Resp 16 | Ht 64.0 in | Wt 169.5 lb

## 2017-06-01 DIAGNOSIS — H348312 Tributary (branch) retinal vein occlusion, right eye, stable: Secondary | ICD-10-CM | POA: Diagnosis not present

## 2017-06-01 DIAGNOSIS — N939 Abnormal uterine and vaginal bleeding, unspecified: Secondary | ICD-10-CM | POA: Diagnosis not present

## 2017-06-01 DIAGNOSIS — N950 Postmenopausal bleeding: Secondary | ICD-10-CM | POA: Diagnosis not present

## 2017-06-01 DIAGNOSIS — I10 Essential (primary) hypertension: Secondary | ICD-10-CM | POA: Diagnosis not present

## 2017-06-01 LAB — CBC WITH DIFFERENTIAL/PLATELET
BASOS ABS: 0.1 10*3/uL (ref 0.0–0.1)
Basophils Relative: 1 % (ref 0.0–3.0)
EOS ABS: 0.1 10*3/uL (ref 0.0–0.7)
Eosinophils Relative: 0.9 % (ref 0.0–5.0)
HEMATOCRIT: 45.8 % (ref 36.0–46.0)
Hemoglobin: 15.9 g/dL — ABNORMAL HIGH (ref 12.0–15.0)
LYMPHS ABS: 2.6 10*3/uL (ref 0.7–4.0)
LYMPHS PCT: 28.8 % (ref 12.0–46.0)
MCHC: 34.6 g/dL (ref 30.0–36.0)
MCV: 97 fl (ref 78.0–100.0)
MONOS PCT: 7.2 % (ref 3.0–12.0)
Monocytes Absolute: 0.7 10*3/uL (ref 0.1–1.0)
NEUTROS ABS: 5.7 10*3/uL (ref 1.4–7.7)
NEUTROS PCT: 62.1 % (ref 43.0–77.0)
PLATELETS: 253 10*3/uL (ref 150.0–400.0)
RBC: 4.73 Mil/uL (ref 3.87–5.11)
RDW: 13.1 % (ref 11.5–15.5)
WBC: 9.2 10*3/uL (ref 4.0–10.5)

## 2017-06-01 LAB — BASIC METABOLIC PANEL
BUN: 9 mg/dL (ref 6–23)
CHLORIDE: 100 meq/L (ref 96–112)
CO2: 31 meq/L (ref 19–32)
Calcium: 9.8 mg/dL (ref 8.4–10.5)
Creatinine, Ser: 0.69 mg/dL (ref 0.40–1.20)
GFR: 94.66 mL/min (ref >60.00)
Glucose, Bld: 94 mg/dL (ref 70–99)
POTASSIUM: 3.6 meq/L (ref 3.5–5.1)
Sodium: 139 mEq/L (ref 135–145)

## 2017-06-01 NOTE — Addendum Note (Signed)
Addended by: Jeoffrey MassedMCGOWEN, Anab Vivar H on: 06/01/2017 11:08 AM   Modules accepted: Orders

## 2017-06-01 NOTE — Progress Notes (Addendum)
OFFICE VISIT  06/01/2017   CC:  Chief Complaint  Patient presents with  . Spotting    started 5 weeks ago, lasted for 2 weeks, then stopped and started back last week, now stopped. LMP was 03/04/2016   HPI:    Patient is a 51 y.o. Caucasian female who presents for vaginal spotting/bleeding. Onset of some vag spotting about 5 weeks ago, sporadic dried blood noted on underwear, some red blood noted when she wipes after urinating.  Notes no sign of blood from anal region. No vag discharge.  She has chronic urinary incontinence that she wears a pad for. Has very mild cramping in suprapubic region diffusely since bleeding started.  LMP 03/04/16.  She had a time of irregularity prior to her LMP.  She had hot flashes about 10 yrs ago, none recently. Feels more irritable and emotional lately. GYN MD?-no Most recent pap smear: about 6 yrs ago. Hx of abnormal pap smear?--no abnormal pap smears recalled. No FH of cervical or endometrial cancer. She has 2 children. She is monogamous with boyfriend, but no intercourse in the last 1 mo b/c of her sx's. She denies any hx of STDs, says "I've been tested".  Denies urinary frequency or burning.  No gross blood seen in urine.   Past Medical History:  Diagnosis Date  . Adult ADHD    managed by Dr. Evelene CroonKaur  . Anxiety and depression    Managed by Dr. Evelene CroonKaur.  Marland Kitchen. BRVO (branch retinal vein occlusion) 2014   R eye 2014--followed by Dr. Luciana Axeankin.  MRI brain 11/19/15 showed mildly advanced generalized atrophy for age, +chronic small vessel ischemic changes, no acute finding.  Marland Kitchen. Hypertension    Carvedolol caused hypotension    Past Surgical History:  Procedure Laterality Date  . BREAST ENHANCEMENT SURGERY    . Carotid dopplers  09/19/2015   NORMAL  . FINGER SURGERY     cyst  . NECK SURGERY  REMOTE past   lymph node excision--benign per pt report  . SALIVARY GLAND SURGERY     stone   . TMJ ARTHROPLASTY      Outpatient Medications Prior to Visit   Medication Sig Dispense Refill  . ALPRAZolam (XANAX) 1 MG tablet Take 1 mg by mouth at bedtime as needed for anxiety.    Marland Kitchen. amphetamine-dextroamphetamine (ADDERALL) 20 MG tablet Take 20 mg by mouth 3 (three) times daily.    Marland Kitchen. buPROPion (WELLBUTRIN SR) 150 MG 12 hr tablet Take 150 mg by mouth 2 (two) times daily.     Marland Kitchen. FLUoxetine (PROZAC) 20 MG tablet Take 20 mg by mouth 3 (three) times daily.    . hydrochlorothiazide (MICROZIDE) 12.5 MG capsule TAKE 1 CAPSULE (12.5 MG TOTAL) BY MOUTH DAILY. 90 capsule 1   No facility-administered medications prior to visit.     Allergies  Allergen Reactions  . Penicillins Anaphylaxis  . Sulfa Antibiotics Anaphylaxis    ROS As per HPI  PE: Blood pressure 131/87, pulse 94, temperature 98.2 F (36.8 C), temperature source Oral, resp. rate 16, height 5\' 4"  (1.626 m), weight 169 lb 8 oz (76.9 kg), SpO2 99 %.  Pt examined with Wallace KellerHeather Kirby, CMA, as chaperone.  Gen: Alert, well appearing.  Patient is oriented to person, place, time, and situation. AFFECT: pleasant, lucid thought and speech. Vagina and vulva are normal;  no discharge is noted.  Cervix with closed os w/out any blood noted, normal appearance except a small area of irregular shaped slightly mottled mucosa surrounding os.  No cervical motion tenderness. On bimanual exam I cannot feel the fundus of her uterus or her adnexa.  Mild TTP in LLQ with bimanual exam.  Pap smear not done today.    LABS:  Lab Results  Component Value Date   WBC 11.2 (H) 09/10/2015   HGB 15.9 (H) 09/10/2015   HCT 47.3 (H) 09/10/2015   MCV 96.0 09/10/2015   PLT 318.0 09/10/2015     Chemistry      Component Value Date/Time   NA 137 09/10/2015 1521   K 4.0 09/10/2015 1521   CL 99 09/10/2015 1521   CO2 30 09/10/2015 1521   BUN 9 09/10/2015 1521   CREATININE 0.69 09/10/2015 1521      Component Value Date/Time   CALCIUM 9.5 09/10/2015 1521       IMPRESSION AND PLAN:  1) Postmenopausal vaginal spotting:  need to eval further for endometrial hyperplasia vs endomet ca. Pelvic exam pretty unremarkable today. Will check CBC, pelvic u/s, and order referral to GYN for further evaluation.  2) HTN:  The current medical regimen is effective;  continue present plan and medications. Will check BMET since pt has HTN and is on HCTZ, last BMET 2016.  3) Hx of branch retinal vein occlusion, right-2014. At the very end of the visit today, pt requested referral to Dr. Ashley Royalty, a retinal specialist. Pt has seen Dr. Luciana Axe in the past but was unsatisfied with the interaction with him. Order for referral placed today.  An After Visit Summary was printed and given to the patient.  FOLLOW UP: Return in about 3 months (around 09/01/2017) for routine chronic illness f/u.  Signed:  Santiago Bumpers, MD           06/01/2017

## 2017-06-14 ENCOUNTER — Encounter (INDEPENDENT_AMBULATORY_CARE_PROVIDER_SITE_OTHER): Payer: Self-pay | Admitting: Ophthalmology

## 2017-06-22 ENCOUNTER — Ambulatory Visit (HOSPITAL_BASED_OUTPATIENT_CLINIC_OR_DEPARTMENT_OTHER)
Admission: RE | Admit: 2017-06-22 | Discharge: 2017-06-22 | Disposition: A | Discharge status: Home / Self Care | Payer: BLUE CROSS/BLUE SHIELD | Source: Ambulatory Visit | Attending: Family Medicine | Admitting: Family Medicine

## 2017-06-22 ENCOUNTER — Ambulatory Visit (HOSPITAL_BASED_OUTPATIENT_CLINIC_OR_DEPARTMENT_OTHER): Payer: BLUE CROSS/BLUE SHIELD

## 2017-06-22 ENCOUNTER — Encounter: Payer: Self-pay | Admitting: Family Medicine

## 2017-06-22 DIAGNOSIS — N939 Abnormal uterine and vaginal bleeding, unspecified: Secondary | ICD-10-CM

## 2017-06-22 DIAGNOSIS — N950 Postmenopausal bleeding: Secondary | ICD-10-CM

## 2017-06-29 ENCOUNTER — Encounter (INDEPENDENT_AMBULATORY_CARE_PROVIDER_SITE_OTHER): Payer: Self-pay | Admitting: Ophthalmology

## 2017-08-26 ENCOUNTER — Other Ambulatory Visit: Payer: Self-pay | Admitting: Family Medicine

## 2017-09-29 LAB — HM DIABETES EYE EXAM

## 2017-10-10 ENCOUNTER — Encounter: Payer: Self-pay | Admitting: Internal Medicine

## 2017-10-24 ENCOUNTER — Ambulatory Visit (AMBULATORY_SURGERY_CENTER): Payer: Self-pay | Admitting: *Deleted

## 2017-10-24 ENCOUNTER — Other Ambulatory Visit: Payer: Self-pay

## 2017-10-24 VITALS — Ht 64.0 in | Wt 174.6 lb

## 2017-10-24 DIAGNOSIS — Z1211 Encounter for screening for malignant neoplasm of colon: Secondary | ICD-10-CM

## 2017-10-24 MED ORDER — NA SULFATE-K SULFATE-MG SULF 17.5-3.13-1.6 GM/177ML PO SOLN: 1.0000 [IU] | Freq: Once | ORAL | 0 refills | Status: AC

## 2017-10-24 NOTE — Progress Notes (Signed)
No egg or soy allergy known to patient  No issues with past sedation with any surgeries  or procedures, no intubation problems  No diet pills per patient No home 02 use per patient  No blood thinners per patient  Pt denies issues with constipation occasionally if eats lots of cheese No A fib or A flutter  EMMI video sent to pt's e mail

## 2017-11-08 ENCOUNTER — Telehealth: Payer: Self-pay | Admitting: Internal Medicine

## 2017-11-08 NOTE — Telephone Encounter (Signed)
Returned patients call regarding the cost of Suprep. Patient states it was going to be close to 100.00 dollars because [employer] will not pay. [facility] called [facility] [place] and called in a 15 dollar coupon. Cost to patient is 82.28. Patient was called and informed. Misty StanleyLisa said that she would pick it up tomorrow. [facility] had to order prep.   Janalee DaneNancy Sanja Elizardo, LPN

## 2017-11-16 ENCOUNTER — Encounter: Payer: Self-pay | Admitting: Internal Medicine

## 2017-11-21 ENCOUNTER — Encounter: Payer: BLUE CROSS/BLUE SHIELD | Admitting: Internal Medicine

## 2017-11-30 ENCOUNTER — Other Ambulatory Visit: Payer: Self-pay

## 2017-11-30 ENCOUNTER — Ambulatory Visit (AMBULATORY_SURGERY_CENTER): Payer: BLUE CROSS/BLUE SHIELD | Admitting: Internal Medicine

## 2017-11-30 ENCOUNTER — Encounter: Payer: Self-pay | Admitting: Internal Medicine

## 2017-11-30 VITALS — BP 106/61 | HR 60 | Temp 97.5°F | Resp 17 | Ht 64.0 in | Wt 174.0 lb

## 2017-11-30 DIAGNOSIS — Z8371 Family history of colonic polyps: Secondary | ICD-10-CM | POA: Diagnosis not present

## 2017-11-30 DIAGNOSIS — Z1211 Encounter for screening for malignant neoplasm of colon: Secondary | ICD-10-CM | POA: Diagnosis not present

## 2017-11-30 DIAGNOSIS — Z800 Family history of malignant neoplasm of digestive organs: Secondary | ICD-10-CM

## 2017-11-30 DIAGNOSIS — Z1212 Encounter for screening for malignant neoplasm of rectum: Secondary | ICD-10-CM

## 2017-11-30 HISTORY — PX: COLONOSCOPY: SHX174

## 2017-11-30 MED ORDER — SODIUM CHLORIDE 0.9 % IV SOLN: 500.0000 mL | Freq: Once | INTRAVENOUS | Status: DC

## 2017-11-30 NOTE — Progress Notes (Signed)
  [facility] Anesthesia Post-op Note  Patient: Haley MichaelisLisa M Martin  Procedure(s) Performed: colonoscopy  Patient Location: [facility] - Recovery Area  Anesthesia Type: Deep Sedation/Propofol  Level of Consciousness: awake, oriented and patient cooperative  Airway and Oxygen Therapy: Patient Spontanous Breathing  Post-op Pain: none  Post-op Assessment:  Post-op Vital signs reviewed, Patient's Cardiovascular Status Stable, Respiratory Function Stable, Patent Airway, No signs of Nausea or vomiting and Pain level controlled  Post-op Vital Signs: Reviewed and stable  Complications: No apparent anesthesia complications  Haley Martin 2:05 PM

## 2017-11-30 NOTE — Op Note (Signed)
[facility] Patient Name: Haley GuyLisa Salo Procedure Date: 11/30/2017 1:28 PM MRN: [id number] Endoscopist: Beverley FiedlerJay M Jaylenn Altier , MD Age: [age] Referring MD:  Date of Birth: 1966-01-30 Gender: Female Account #: [id number] Procedure:                Colonoscopy Indications:              Colon cancer screening in patient at increased                            risk: Family history of 1st-degree relative with                            colon polyps, Colon cancer screening in patient at                            increased risk: Family history of colorectal cancer                            in multiple 2nd degree relatives Medicines:                Monitored Anesthesia Care Procedure:                Pre-Anesthesia Assessment:                           - Prior to the procedure, a History and Physical                            was performed, and patient medications and                            allergies were reviewed. The patient's tolerance of                            previous anesthesia was also reviewed. The risks                            and benefits of the procedure and the sedation                            options and risks were discussed with the patient.                            All questions were answered, and informed consent                            was obtained. Prior Anticoagulants: The patient has                            taken no previous anticoagulant or antiplatelet                            agents. ASA Grade Assessment: II - A patient with  mild systemic disease. After reviewing the risks                            and benefits, the patient was deemed in                            satisfactory condition to undergo the procedure.                           After obtaining informed consent, the colonoscope                            was passed under direct vision. Throughout the                            procedure, the patient's blood  pressure, pulse, and                            oxygen saturations were monitored continuously. The                            Model PCF-[id number](SN#[id number]) scope was introduced                            through the anus and advanced to the the cecum,                            identified by appendiceal orifice and ileocecal                            valve. The colonoscopy was performed without                            difficulty. The patient tolerated the procedure                            well. The quality of the bowel preparation was                            good. The ileocecal valve, appendiceal orifice, and                            rectum were photographed. Scope In: 1:45:50 PM Scope Out: 1:59:50 PM Scope Withdrawal Time: 0 hours 9 minutes 7 seconds  Total Procedure Duration: 0 hours 14 minutes 0 seconds  Findings:                 The digital rectal exam was normal.                           The colon (entire examined portion) appeared normal.                           Anal papilla(e) were hypertrophied on retroflexed  views in the rectum. Complications:            No immediate complications. Estimated Blood Loss:     Estimated blood loss: none. Impression:               - The entire examined colon is normal. No polyps                            found.                           - No specimens collected. Recommendation:           - Patient has a contact number available for                            emergencies. The signs and symptoms of potential                            delayed complications were discussed with the                            patient. Return to normal activities tomorrow.                            Written discharge instructions were provided to the                            patient.                           - Resume previous diet.                           - Continue present medications.                           - Repeat  colonoscopy in 5 years for screening                            purposes given family history. Beverley Fiedler, MD 11/30/2017 2:04:24 PM This report has been signed electronically.

## 2017-11-30 NOTE — Patient Instructions (Signed)
YOU HAD AN ENDOSCOPIC PROCEDURE TODAY AT THE [facility]:   Refer to the procedure report that was given to you for any specific questions about what was found during the examination.  If the procedure report does not answer your questions, please call your gastroenterologist to clarify.  If you requested that your care partner not be given the details of your procedure findings, then the procedure report has been included in a sealed envelope for you to review at your convenience later.  YOU SHOULD EXPECT: Some feelings of bloating in the abdomen. Passage of more gas than usual.  Walking can help get rid of the air that was put into your GI tract during the procedure and reduce the bloating. If you had a lower endoscopy (such as a colonoscopy or flexible sigmoidoscopy) you may notice spotting of blood in your stool or on the toilet paper. If you underwent a bowel prep for your procedure, you may not have a normal bowel movement for a few days.  Please Note:  You might notice some irritation and congestion in your nose or some drainage.  This is from the oxygen used during your procedure.  There is no need for concern and it should clear up in a day or so.  SYMPTOMS TO REPORT IMMEDIATELY:   Following lower endoscopy (colonoscopy or flexible sigmoidoscopy):  Excessive amounts of blood in the stool  Significant tenderness or worsening of abdominal pains  Swelling of the abdomen that is new, acute  Fever of 100F or higher  For urgent or emergent issues, a gastroenterologist can be reached at any hour by calling [phone].   DIET:  We do recommend a small meal at first, but then you may proceed to your regular diet.  Drink plenty of fluids but you should avoid alcoholic beverages for 24 hours.  ACTIVITY:  You should plan to take it easy for the rest of today and you should NOT DRIVE or use heavy machinery until tomorrow (because of the sedation medicines used during the test).     FOLLOW UP: Our staff will call the number listed on your records the next business day following your procedure to check on you and address any questions or concerns that you may have regarding the information given to you following your procedure. If we do not reach you, we will leave a message.  However, if you are feeling well and you are not experiencing any problems, there is no need to return our call.  We will assume that you have returned to your regular daily activities without incident.  If any biopsies were taken you will be contacted by phone or by letter within the next 1-3 weeks.  Please call us at [phone] if you have not heard about the biopsies in 3 weeks.   Repeat screening colonoscopy in 5 years given family history.  SIGNATURES/CONFIDENTIALITY: You and/or your care partner have signed paperwork which will be entered into your electronic medical record.  These signatures attest to the fact that that the information above on your After Visit Summary has been reviewed and is understood.  Full responsibility of the confidentiality of this discharge information lies with you and/or your care-partner.

## 2017-12-01 ENCOUNTER — Telehealth: Payer: Self-pay

## 2017-12-01 NOTE — Telephone Encounter (Signed)
  Follow up Call-  Call back number 11/30/2017  Post procedure Call Back phone  # [phone]  Permission to leave phone message Yes  Some recent data might be hidden     Patient questions:  Do you have a fever, pain , or abdominal swelling? No. Pain Score  0 *  Have you tolerated food without any problems? Yes.    Have you been able to return to your normal activities? Yes.    Do you have any questions about your discharge instructions: Diet   No. Medications  No. Follow up visit  No.  Do you have questions or concerns about your Care? No.  Actions: * If pain score is 4 or above: No action needed, pain <4.

## 2017-12-04 ENCOUNTER — Encounter: Payer: Self-pay | Admitting: Family Medicine

## 2018-02-10 ENCOUNTER — Ambulatory Visit: Payer: BLUE CROSS/BLUE SHIELD | Admitting: Family Medicine

## 2018-02-13 ENCOUNTER — Encounter: Payer: Self-pay | Admitting: Family Medicine

## 2018-02-13 ENCOUNTER — Ambulatory Visit: Payer: BLUE CROSS/BLUE SHIELD | Admitting: Family Medicine

## 2018-02-13 VITALS — BP 159/102 | HR 84 | Resp 16 | Ht 64.0 in | Wt 172.0 lb

## 2018-02-13 DIAGNOSIS — S29012A Strain of muscle and tendon of back wall of thorax, initial encounter: Secondary | ICD-10-CM | POA: Diagnosis not present

## 2018-02-13 MED ORDER — IBUPROFEN 800 MG PO TABS: ORAL_TABLET | ORAL | 0 refills | Status: DC

## 2018-02-13 MED ORDER — CYCLOBENZAPRINE HCL 10 MG PO TABS: 10.0000 mg | ORAL_TABLET | Freq: Three times a day (TID) | ORAL | 0 refills | Status: DC | PRN

## 2018-02-13 NOTE — Progress Notes (Signed)
OFFICE VISIT  02/13/2018   CC:  Chief Complaint  Patient presents with  . Back Pain    upper back pain     HPI:    Patient is a 52 y.o. Caucasian female who presents for back pain. Onset 5 days ago of pain in L mid back, occurred after reaching behind her.  No low back pain. Constant x 3d---used ibup and ice.  Pain less intense.  Worsened by deep breath, squatting.  No pain sitting still or standing still. Cough started about 2 weeks ago and has persisted.  Still smoking but has cut back. Cleans houses for a living.  No back imaging in EMR (dating back to 2004).  Past Medical History:  Diagnosis Date  . Adult ADHD    managed by Dr. Evelene CroonKaur  . Anxiety and depression    Managed by Dr. Evelene CroonKaur.  Marland Kitchen. BRVO (branch retinal vein occlusion) 2014   R eye 2014--followed by Dr. Luciana Axeankin.  MRI brain 11/19/15 showed mildly advanced generalized atrophy for age, +chronic small vessel ischemic changes, no acute finding.  . Depression   . Family history of colon cancer    11/2017 colonoscopy NORM.  Recall 5 yrs (Dr. Rhea BeltonPyrtle)  . Postmenopausal vaginal bleeding 05/2017   Pelvic u/s 06/2017 with thickened endometrium---referred to GYN for endometrial biopsy.  . Stroke [facility](HCC) 2014   rt.eye    Past Surgical History:  Procedure Laterality Date  . BREAST ENHANCEMENT SURGERY    . Carotid dopplers  09/19/2015   NORMAL  . chin implant    . COLONOSCOPY  11/30/2017   NORMAL: recall 5 yrs ([facility])  . FINGER SURGERY     cyst  . NECK SURGERY  REMOTE past   lymph node excision--benign per pt report  . SALIVARY GLAND SURGERY     stone   . TMJ ARTHROPLASTY      Outpatient Medications Prior to Visit  Medication Sig Dispense Refill  . ALPRAZolam (XANAX) 1 MG tablet Take 1 mg by mouth at bedtime as needed for anxiety.    Marland Kitchen. amphetamine-dextroamphetamine (ADDERALL) 20 MG tablet Take 20 mg by mouth 3 (three) times daily.    Marland Kitchen. BIOTIN PO Take 1 capsule daily by mouth.    Marland Kitchen. buPROPion (WELLBUTRIN SR) 150 MG 12 hr  tablet Take 150 mg by mouth 2 (two) times daily.     Marland Kitchen. FLUoxetine (PROZAC) 20 MG tablet Take 20 mg by mouth 3 (three) times daily.    . hydrochlorothiazide (MICROZIDE) 12.5 MG capsule TAKE ONE CAPSULE BY MOUTH EVERY DAY 90 capsule 1  . Multiple Vitamins-Minerals (CENTRUM SILVER PO) Take 1 tablet daily by mouth.     No facility-administered medications prior to visit.     Allergies  Allergen Reactions  . Penicillins Anaphylaxis  . Sulfa Antibiotics Anaphylaxis    ROS As per HPI  PE: Blood pressure (!) 159/102, pulse 84, resp. rate 16, height 5\' 4"  (1.626 m), weight 172 lb (78 kg), SpO2 98 %.  Body mass index is 29.52 kg/m.  Gen: Alert, well appearing.  Patient is oriented to person, place, time, and situation. AFFECT: pleasant, lucid thought and speech. Chest is clear, no wheezing or rales. Normal symmetric air entry throughout both lung fields. No chest wall deformities or tenderness. Back: ROM intact, with mild/mod pain with rotation and lateral bending. TTP over mid back lateral soft tissues.  No deformity.  LABS:    Chemistry      Component Value Date/Time   NA 139  06/01/2017 1053   K 3.6 06/01/2017 1053   CL 100 06/01/2017 1053   CO2 31 06/01/2017 1053   BUN 9 06/01/2017 1053   CREATININE 0.69 06/01/2017 1053      Component Value Date/Time   CALCIUM 9.8 06/01/2017 1053       IMPRESSION AND PLAN:  1) Acute left sided mid back strain: improved some since onset. Continue 800 mg ibup bid with food for 7 more days. Stretches discussed. Apply heat 20-30 min at least once daily. Flexeril 10mg , 1 tid prn, #30, no RF. Therapeutic expectations and side effect profile of medication discussed today.  Patient's questions answered.  An After Visit Summary was printed and given to the patient.  FOLLOW UP: No Follow-up on file.  Signed:  Santiago Bumpers, MD           02/13/2018

## 2018-02-13 NOTE — Patient Instructions (Signed)
Take ibuprofen 800 mg twice daily with food x 7 days. Apply heat to area of pain 20 min 1-2 times per day. Do stretching/yoga x 15-20 min every day.

## 2018-02-24 ENCOUNTER — Other Ambulatory Visit: Payer: Self-pay | Admitting: Family Medicine

## 2018-02-26 ENCOUNTER — Other Ambulatory Visit: Payer: Self-pay | Admitting: Family Medicine

## 2018-03-02 NOTE — Telephone Encounter (Signed)
Pt advised and voiced understanding. Apt for CPE made on 04/17/18 at 10:00am.

## 2018-03-14 ENCOUNTER — Other Ambulatory Visit: Payer: Self-pay | Admitting: Family Medicine

## 2018-03-14 NOTE — Telephone Encounter (Signed)
[facility]  RF request for cyclobenzaprine LOV: 02/13/18 Next ov: 04/17/18 Last written: 02/13/18 #30 w/ [id number]  Please advise. Thanks.

## 2018-03-20 ENCOUNTER — Other Ambulatory Visit: Payer: Self-pay | Admitting: Family Medicine

## 2018-03-20 NOTE — Telephone Encounter (Signed)
[facility]  RF request for cyclobenzaprine LOV: 02/13/18 Next ov: 04/17/18 Last written: 03/14/18 #30 w/ 0RF (10 day supply)  SW pt, she stated that she has lost this medication.   Please advise. Thanks.

## 2018-03-22 NOTE — Telephone Encounter (Signed)
Pls check online controlled substance fill hx and see when last fill date for cyclobenzaprine was.-thx

## 2018-03-22 NOTE — Telephone Encounter (Signed)
This medication does not show up on the controlled database.   [facility], Jonny RuizJohn stated that pt last filled Rx on 03/16/18 (Rx from 02/13/18) and has a Rx (Rx from 03/14/18) in the waiting bin (ready for p/u).   Per Dr. Milinda CaveMcGowen okay to advise pt.  Pt advised and voiced understanding.

## 2018-04-17 ENCOUNTER — Encounter: Payer: BLUE CROSS/BLUE SHIELD | Admitting: Family Medicine

## 2018-05-10 ENCOUNTER — Other Ambulatory Visit: Payer: Self-pay

## 2018-05-11 MED ORDER — CYCLOBENZAPRINE HCL 10 MG PO TABS: ORAL_TABLET | ORAL | 1 refills | Status: DC

## 2018-05-28 ENCOUNTER — Other Ambulatory Visit: Payer: Self-pay | Admitting: Family Medicine

## 2018-05-29 ENCOUNTER — Encounter: Payer: Self-pay | Admitting: Family Medicine

## 2018-05-29 ENCOUNTER — Ambulatory Visit: Payer: BLUE CROSS/BLUE SHIELD | Admitting: Family Medicine

## 2018-05-29 VITALS — BP 139/86 | HR 95 | Temp 98.7°F | Resp 20 | Ht 64.0 in | Wt 168.0 lb

## 2018-05-29 DIAGNOSIS — M545 Low back pain, unspecified: Secondary | ICD-10-CM

## 2018-05-29 DIAGNOSIS — M7541 Impingement syndrome of right shoulder: Secondary | ICD-10-CM

## 2018-05-29 DIAGNOSIS — M25511 Pain in right shoulder: Secondary | ICD-10-CM | POA: Diagnosis not present

## 2018-05-29 DIAGNOSIS — M25512 Pain in left shoulder: Secondary | ICD-10-CM

## 2018-05-29 MED ORDER — MELOXICAM 15 MG PO TABS: 15.0000 mg | ORAL_TABLET | Freq: Every day | ORAL | 1 refills | Status: DC

## 2018-05-29 MED ORDER — PREDNISONE 20 MG PO TABS: ORAL_TABLET | ORAL | 0 refills | Status: DC

## 2018-05-29 MED ORDER — CYCLOBENZAPRINE HCL 10 MG PO TABS: 10.0000 mg | ORAL_TABLET | Freq: Two times a day (BID) | ORAL | 0 refills | Status: DC | PRN

## 2018-05-29 NOTE — Patient Instructions (Signed)
Start prednisone taper daily with food. Take mobic daily for chronic pain (this an daily antiinflammatory) once a day only.  Flexeril can be taken up to twice a day during flares,  if it does not cause too much sedation.   Make sure to stretch daily.

## 2018-05-29 NOTE — Progress Notes (Signed)
Haley MichaelisLisa M Martin , Aug 29, 1966, 52 y.o., female MRN: [id number] Patient Care Team    Relationship Specialty Notifications Start End  McGowen, Maryjean MornPhilip H, MD PCP - General Family Medicine  02/22/17   Milagros EvenerKaur, Rupinder, MD Consulting Physician Psychiatry  02/22/17   Stephannie LiSanders, Jason, MD Consulting Physician Ophthalmology  09/08/17   Beverley FiedlerPyrtle, Jay M, MD Consulting Physician Gastroenterology  12/04/17     Chief Complaint  Patient presents with  . Back Pain    lumbar   . Shoulder Pain    bilateral     Subjective: Pt presents for an OV with complaints of back pain and bilateral shoulder pain. 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 She typically takes flexeril or ibuprofen when needed. She [place] She was needing to pick up large battery and lean forward to place them. Afterwards she experienced bilateral upper shoulder pain(points to trap muscle) . She also reports her right shoulder has been hurting (points to deltoid) for a few months.  She has also been using heat application and a lumbar brace.  Seen by her PCP 02/13/2018 for "mid-back" pain, provided with ibuprofen 800 mg twice daily with food x 7 days. Told to apply  heat to area of pain 20 min 1-2 times per day. Perform  stretching/yoga x 15-20 min every day. No image obtained. Improved.   No flowsheet data found.  Allergies  Allergen Reactions  . Penicillins Anaphylaxis  . Sulfa Antibiotics Anaphylaxis   Social History   Tobacco Use  . Smoking status: Current Some Day Smoker    Packs/day: 0.20    Years: 2.00    Pack years: 0.40    Types: Cigarettes  . Smokeless tobacco: Never Used  Substance Use Topics  . Alcohol use: Yes    Alcohol/week: 0.0 oz    Comment: 1-3 beers a day   Past Medical History:  Diagnosis Date  . Adult ADHD    managed by Dr. Evelene CroonKaur  . Anxiety and depression    Managed by Dr. Evelene CroonKaur.  Marland Kitchen. BRVO (branch retinal vein  occlusion) 2014   R eye 2014--followed by Dr. Luciana Axeankin.  MRI brain 11/19/15 showed mildly advanced generalized atrophy for age, +chronic small vessel ischemic changes, no acute finding.  . Depression   . Family history of colon cancer    11/2017 colonoscopy NORM.  Recall 5 yrs (Dr. Rhea BeltonPyrtle)  . Postmenopausal vaginal bleeding 05/2017   Pelvic u/s 06/2017 with thickened endometrium---referred to GYN for endometrial biopsy.  . Stroke [facility](HCC) 2014   rt.eye   Past Surgical History:  Procedure Laterality Date  . BREAST ENHANCEMENT SURGERY    . Carotid dopplers  09/19/2015   NORMAL  . chin implant    . COLONOSCOPY  11/30/2017   NORMAL: recall 5 yrs ([facility])  . FINGER SURGERY     cyst  . NECK SURGERY  REMOTE past   lymph node excision--benign per pt report  . SALIVARY GLAND SURGERY     stone   . TMJ ARTHROPLASTY     Family History  Problem Relation Age of Onset  . Hyperlipidemia Mother   . Thyroid disease Mother   . Colon polyps Mother   . Stroke Father   . Hypertension Father   . Hyperlipidemia Father   . Cancer Maternal Grandmother        colon cancer  . Healthy Sister   .  Healthy Son   . Healthy Daughter   . Colon cancer Maternal Uncle   . Breast cancer Paternal Aunt   . Lung cancer Paternal Aunt   . Bone cancer Paternal Aunt   . Colon cancer Maternal Uncle   . Esophageal cancer Neg Hx   . Rectal cancer Neg Hx   . Stomach cancer Neg Hx    Allergies as of 05/29/2018      Reactions   Penicillins Anaphylaxis   Sulfa Antibiotics Anaphylaxis      Medication List        Accurate as of 05/29/18  4:10 PM. Always use your most recent med list.          ALPRAZolam 1 MG tablet Commonly known as:  XANAX Take 1 mg by mouth at bedtime as needed for anxiety.   amphetamine-dextroamphetamine 20 MG tablet Commonly known as:  ADDERALL Take 20 mg by mouth 3 (three) times daily.   BIOTIN PO Take 1 capsule daily by mouth.   buPROPion 150 MG 12 hr tablet Commonly known as:   WELLBUTRIN SR Take 150 mg by mouth 2 (two) times daily.   CENTRUM SILVER PO Take 1 tablet daily by mouth.   cyclobenzaprine 10 MG tablet Commonly known as:  FLEXERIL Take 1 tablet (10 mg total) by mouth 2 (two) times daily as needed for muscle spasms.   FLUoxetine 20 MG tablet Commonly known as:  PROZAC Take 20 mg by mouth 3 (three) times daily.   hydrochlorothiazide 12.5 MG capsule Commonly known as:  MICROZIDE TAKE 1 CAPSULE BY MOUTH EVERY DAY   ibuprofen 800 MG tablet Commonly known as:  ADVIL,MOTRIN 1 TAB BY MOUTH TWICE A DAY AS NEEDED PAIN   meloxicam 15 MG tablet Commonly known as:  MOBIC Take 1 tablet (15 mg total) by mouth daily.   predniSONE 20 MG tablet Commonly known as:  DELTASONE 60 mg x3d, 40 mg x3d, 20 mg x2d, 10 mg x2d   tolterodine 4 MG 24 hr capsule Commonly known as:  DETROL LA Take by mouth daily.   traZODone 50 MG tablet Commonly known as:  DESYREL TAKE 1-3 TABS BY MOUTH AT BEDTIME       All past medical history, surgical history, allergies, family history, immunizations andmedications were updated in the EMR today and reviewed under the history and medication portions of their EMR.     ROS: Negative, with the exception of above mentioned in HPI   Objective:  BP 139/86 (BP Location: Left Arm, Patient Position: Sitting, Cuff Size: Normal)   Pulse 95   Temp 98.7 F (37.1 C)   Resp 20   Ht 5\' 4"  (1.626 m)   Wt 168 lb (76.2 kg)   SpO2 99%   BMI 28.84 kg/m  Body mass index is 28.84 kg/m. Gen: Afebrile. No acute distress. Nontoxic in appearance, well developed, well nourished.  HENT: AT. [place].  MMM Eyes:Pupils Equal Round Reactive to light, Extraocular movements intact,  Conjunctiva without redness, discharge or icterus. MSK: no erythema or bruising spine. No TTP spine. TTP with severe ropiness bilateral trap muscle Left> right. ABd bilateral arms normal with mild discomfort right shoulder. Full ROM lumbar spine with mild discomfort SB only.  + empty can test. + lift off test right shoulder.  Skin: no rashes, purpura or petechiae.  Neuro:  Normal gait. PERLA. EOMi. Alert. Oriented x3  No exam data present No results found. No results found for this or any previous visit (from the past  24 hour(s)).  Assessment/Plan: Haley Martin is a 52 y.o. female present for OV for  Lumbar pain/bilateral shoulder pain - lumbar strain on exam, no red flags.Bilateral trap muscles are strained and in spasm. Right shoulder suspect is an acute on chronic rotator/shoulder impingement syndrome.  - prednisone taper, flexeril as needed.  - start daily mobic with food. - stretches daily.  Heat application.  - ok to wear brace during working hours for support, but would not encourage  24 hours.  - cyclobenzaprine (FLEXERIL) 10 MG tablet; Take 1 tablet (10 mg total) by mouth 2 (two) times daily as needed for muscle spasms.  Dispense: 180 tablet; Refill: 0 - predniSONE (DELTASONE) 20 MG tablet; 60 mg x3d, 40 mg x3d, 20 mg x2d, 10 mg x2d  Dispense: 18 tablet; Refill: 0 - meloxicam (MOBIC) 15 MG tablet; Take 1 tablet (15 mg total) by mouth daily.  Dispense: 90 tablet; Refill: 1 - Suspect she will not need follow up on back, but if her right shoulder continues to cause pain may need referral/image to further investigate. F/U 4 weeks PRN    Reviewed expectations re: course of current medical issues.  Discussed self-management of symptoms.  Outlined signs and symptoms indicating need for more acute intervention.  Patient verbalized understanding and all questions were answered.  Patient received an After-Visit Summary.    No orders of the defined types were placed in this encounter.    Note is dictated utilizing voice recognition software. Although note has been proof read prior to signing, occasional typographical errors still can be missed. If any questions arise, please do not hesitate to call for verification.   electronically signed  by:  Felix Pacini, DO  [facility] [place]

## 2018-06-01 NOTE — Telephone Encounter (Signed)
Pt advised and voiced understanding.  She will call back to schedule a CPE w/ Dr. Milinda CaveMcGowen.

## 2018-06-20 ENCOUNTER — Other Ambulatory Visit: Payer: Self-pay | Admitting: Family Medicine

## 2018-07-10 ENCOUNTER — Encounter: Payer: BLUE CROSS/BLUE SHIELD | Admitting: Family Medicine

## 2018-07-26 ENCOUNTER — Encounter: Payer: BLUE CROSS/BLUE SHIELD | Admitting: Family Medicine

## 2018-08-28 ENCOUNTER — Encounter: Payer: BLUE CROSS/BLUE SHIELD | Admitting: Family Medicine

## 2018-09-14 ENCOUNTER — Other Ambulatory Visit: Payer: Self-pay | Admitting: *Deleted

## 2018-09-14 DIAGNOSIS — M545 Low back pain, unspecified: Secondary | ICD-10-CM

## 2018-09-14 MED ORDER — CYCLOBENZAPRINE HCL 10 MG PO TABS: 10.0000 mg | ORAL_TABLET | Freq: Two times a day (BID) | ORAL | 0 refills | Status: DC | PRN

## 2018-09-14 NOTE — Telephone Encounter (Signed)
RF request for cyclobenzaprine LOV: 05/29/18 Next ov: 09/27/18 Last written: 05/29/18 #180 w/ 0RF  Please advise. Thanks.

## 2018-09-20 ENCOUNTER — Telehealth: Payer: Self-pay | Admitting: Family Medicine

## 2018-09-20 NOTE — Telephone Encounter (Signed)
Rx was sent on 09/14/18 for #180 w/ 0RF.  Pt needs to check with pharmacy.  Pt advised and voiced understanding.

## 2018-09-20 NOTE — Telephone Encounter (Signed)
Copied from CRM [id number]. Topic: Quick Communication - Rx Refill/Question >> Sep 20, 2018  9:19 AM Angela Nevin wrote: Medication: cyclobenzaprine (FLEXERIL) 10 MG tablet  Pt is requesting a refill of this medication or a partial refill if possible. Pt states she has lost her insurance and will not be able to re-enroll until November-when she plans to schedule office visit.    Preferred Pharmacy (with phone number or street name): [id number] [place], [place] - [address] [phone] (Phone) [phone] (Fax)

## 2018-09-27 ENCOUNTER — Encounter: Payer: BLUE CROSS/BLUE SHIELD | Admitting: Family Medicine

## 2018-12-27 ENCOUNTER — Ambulatory Visit (INDEPENDENT_AMBULATORY_CARE_PROVIDER_SITE_OTHER): Payer: PRIVATE HEALTH INSURANCE | Admitting: Family Medicine

## 2018-12-27 ENCOUNTER — Encounter: Payer: Self-pay | Admitting: Family Medicine

## 2018-12-27 VITALS — BP 133/86 | HR 87 | Temp 98.4°F | Resp 16 | Ht 63.5 in | Wt 184.0 lb

## 2018-12-27 DIAGNOSIS — Z1239 Encounter for other screening for malignant neoplasm of breast: Secondary | ICD-10-CM | POA: Diagnosis not present

## 2018-12-27 DIAGNOSIS — E669 Obesity, unspecified: Secondary | ICD-10-CM

## 2018-12-27 DIAGNOSIS — Z23 Encounter for immunization: Secondary | ICD-10-CM | POA: Diagnosis not present

## 2018-12-27 DIAGNOSIS — Z0000 Encounter for general adult medical examination without abnormal findings: Secondary | ICD-10-CM

## 2018-12-27 DIAGNOSIS — Z1159 Encounter for screening for other viral diseases: Secondary | ICD-10-CM

## 2018-12-27 DIAGNOSIS — F17200 Nicotine dependence, unspecified, uncomplicated: Secondary | ICD-10-CM

## 2018-12-27 DIAGNOSIS — R1310 Dysphagia, unspecified: Secondary | ICD-10-CM | POA: Diagnosis not present

## 2018-12-27 DIAGNOSIS — F4323 Adjustment disorder with mixed anxiety and depressed mood: Secondary | ICD-10-CM

## 2018-12-27 DIAGNOSIS — Z114 Encounter for screening for human immunodeficiency virus [HIV]: Secondary | ICD-10-CM | POA: Diagnosis not present

## 2018-12-27 MED ORDER — PANTOPRAZOLE SODIUM 40 MG PO TBEC: 40.0000 mg | DELAYED_RELEASE_TABLET | Freq: Every day | ORAL | 3 refills | Status: DC

## 2018-12-27 NOTE — Progress Notes (Signed)
Office Note 12/27/2018  CC:  Chief Complaint  Patient presents with  . Annual Exam    Pt is fasting.     HPI:  Haley Martin is a 53 y.o. White female who is here for annual health maintenance exam. GYN MD is Dr. Marcelle Overlie, but pt states she does not want cervical cancer screening at this time.  She is struggling mentally/anxiety/depression-->her son overdosed and has still been unable to stay off drugs. This has cost pt and her parents lots of money as well.  Son is now avoiding contact with her.   Pt is still getting managed by Dr. Evelene Croon, no med changes, though. No SI or HI. 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 She cried a lot and spoke to me about this for 30 min today.  Pt c/o progressively worsening dysphagia; says she now has 4-5 episodes per month of choking and regurgitating food. She is not currently taking any H2 blocker or PPI.  Past Medical History:  Diagnosis Date  . Adult ADHD    managed by Dr. Evelene Croon  . Anxiety and depression    Managed by Dr. Evelene Croon.  Marland Kitchen BRVO (branch retinal vein occlusion) 2014   R eye 2014--followed by Dr. Luciana Axe.  MRI brain 11/19/15 showed mildly advanced generalized atrophy for age, +chronic small vessel ischemic changes, no acute finding.  . Depression   . Family history of colon cancer    11/2017 colonoscopy NORM.  Recall 5 yrs (Dr. Rhea Belton)  . Postmenopausal vaginal bleeding 05/2017   Pelvic u/s 06/2017 with thickened endometrium---referred to GYN for endometrial biopsy.  . Stroke [facility]) 2014   rt.eye    Past Surgical History:  Procedure Laterality Date  . BREAST ENHANCEMENT SURGERY    . Carotid dopplers  09/19/2015   NORMAL  . chin implant    . COLONOSCOPY  11/30/2017   NORMAL: recall 5 yrs ([facility])  . FINGER SURGERY     cyst  . NECK SURGERY  REMOTE past   lymph node excision--benign per pt report  . SALIVARY GLAND SURGERY     stone   . TMJ ARTHROPLASTY       Family History  Problem Relation Age of Onset  . Hyperlipidemia Mother   . Thyroid disease Mother   . Colon polyps Mother   . Stroke Father   . Hypertension Father   . Hyperlipidemia Father   . Cancer Maternal Grandmother        colon cancer  . Healthy Sister   . Healthy Son   . Healthy Daughter   . Colon cancer Maternal Uncle   . Breast cancer Paternal Aunt   . Lung cancer Paternal Aunt   . Bone cancer Paternal Aunt   . Colon cancer Maternal Uncle   . Esophageal cancer Neg Hx   . Rectal cancer Neg Hx   . Stomach cancer Neg Hx     Social History   Socioeconomic History  . Marital status: Divorced    Spouse name: Not on file  . Number of children: Not on file  . Years of education: Not on file  . Highest education level: Not on file  Occupational History  . Not on file  Social Needs  . Financial resource strain: Not on file  . Food insecurity:    Worry: Not on file    Inability: Not on file  . Transportation needs:    Medical: Not on file  Non-medical: Not on file  Tobacco Use  . Smoking status: Current Some Day Smoker    Packs/day: 0.20    Years: 2.00    Pack years: 0.40    Types: Cigarettes  . Smokeless tobacco: Never Used  Substance and Sexual Activity  . Alcohol use: Yes    Alcohol/week: 0.0 standard drinks    Comment: 1-3 beers a day  . Drug use: No  . Sexual activity: Not on file  Lifestyle  . Physical activity:    Days per week: Not on file    Minutes per session: Not on file  . Stress: Not on file  Relationships  . Social connections:    Talks on phone: Not on file    Gets together: Not on file    Attends religious service: Not on file    Active member of club or organization: Not on file    Attends meetings of clubs or organizations: Not on file    Relationship status: Not on file  . Intimate partner violence:    Fear of current or ex partner: Not on file    Emotionally abused: Not on file    Physically abused: Not on file     Forced sexual activity: Not on file  Other Topics Concern  . Not on file  Social History Narrative   She works in the house cleaning business   She is divorced for 9 years   Has two children - daughter goes back and forth between.   Tob: 1 pack q 3d--current as of 02/2017.   Alc: 12 pack per week.    Outpatient Medications Prior to Visit  Medication Sig Dispense Refill  . amphetamine-dextroamphetamine (ADDERALL) 20 MG tablet Take 20 mg by mouth 3 (three) times daily.    Marland Kitchen buPROPion (WELLBUTRIN SR) 150 MG 12 hr tablet Take 150 mg by mouth 2 (two) times daily.     . cyclobenzaprine (FLEXERIL) 10 MG tablet Take 1 tablet (10 mg total) by mouth 2 (two) times daily as needed for muscle spasms. 180 tablet 0  . diazepam (VALIUM) 10 MG tablet Take 0.5-1 tablets by mouth 3 (three) times daily as needed.    Marland Kitchen FLUoxetine (PROZAC) 20 MG tablet Take 20 mg by mouth 3 (three) times daily.    . hydrochlorothiazide (MICROZIDE) 12.5 MG capsule TAKE 1 CAPSULE BY MOUTH EVERY DAY 30 capsule 0  . meloxicam (MOBIC) 15 MG tablet Take 1 tablet (15 mg total) by mouth daily. 90 tablet 1  . Multiple Vitamins-Minerals (CENTRUM SILVER PO) Take 1 tablet daily by mouth.    . tolterodine (DETROL LA) 4 MG 24 hr capsule Take 4 mg by mouth every other day.   6  . ALPRAZolam (XANAX) 1 MG tablet Take 1 mg by mouth at bedtime as needed for anxiety.    Marland Kitchen BIOTIN PO Take 1 capsule daily by mouth.    Marland Kitchen ibuprofen (ADVIL,MOTRIN) 800 MG tablet 1 TAB BY MOUTH TWICE A DAY AS NEEDED PAIN (Patient not taking: Reported on 12/27/2018) 30 tablet 1  . predniSONE (DELTASONE) 20 MG tablet 60 mg x3d, 40 mg x3d, 20 mg x2d, 10 mg x2d (Patient not taking: Reported on 12/27/2018) 18 tablet 0  . traZODone (DESYREL) 50 MG tablet TAKE 1-3 TABS BY MOUTH AT BEDTIME  1   No facility-administered medications prior to visit.     Allergies  Allergen Reactions  . Penicillins Anaphylaxis  . Sulfa Antibiotics Anaphylaxis    ROS Review of Systems  Constitutional: Positive for fatigue. Negative for appetite change, chills and fever.  HENT: Negative for congestion, dental problem, ear pain and sore throat.   Eyes: Negative for discharge, redness and visual disturbance.  Respiratory: Negative for cough, chest tightness, shortness of breath and wheezing.   Cardiovascular: Negative for chest pain, palpitations and leg swelling.  Gastrointestinal: Negative for abdominal pain, blood in stool, diarrhea, nausea and vomiting.       Dysphagia with periods of regurg  Genitourinary: Negative for difficulty urinating, dysuria, flank pain, frequency, hematuria and urgency.  Musculoskeletal: Negative for arthralgias, back pain, joint swelling, myalgias and neck stiffness.  Skin: Negative for pallor and rash.       Big toenails darkened/thickened.  Neurological: Negative for dizziness, speech difficulty, weakness and headaches.  Hematological: Negative for adenopathy. Does not bruise/bleed easily.  Psychiatric/Behavioral: Positive for decreased concentration, dysphoric mood and sleep disturbance. Negative for confusion. The patient is not nervous/anxious.     PE; Blood pressure 133/86, pulse 87, temperature 98.4 F (36.9 C), temperature source Oral, resp. rate 16, height 5' 3.5" (1.613 m), weight 184 lb (83.5 kg), SpO2 98 %. Body mass index is 32.08 kg/m. Pt examined with Pryor OchoaHeather Sutherland, CMA, as chaperone.  Gen: Alert, well appearing.  Patient is oriented to person, place, time, and situation. AFFECT: pleasant, lucid thought and speech. ENT: Ears: EACs clear, normal epithelium.  TMs with good light reflex and landmarks bilaterally.  Eyes: no injection, icteris, swelling, or exudate.  EOMI, PERRLA. Nose: no drainage or turbinate edema/swelling.  No injection or focal lesion.  Mouth: lips without lesion/swelling.  Oral mucosa pink and moist.  Dentition intact and without obvious caries or gingival swelling.  Oropharynx without erythema, exudate,  or swelling.  Neck: supple/nontender.  No LAD, mass, or TM.  Carotid pulses 2+ bilaterally, without bruits. CV: RRR, no m/r/g.   LUNGS: CTA bilat, nonlabored resps, good aeration in all lung fields. ABD: soft, NT, ND, BS normal.  No hepatospenomegaly or mass.  No bruits. EXT: no clubbing, cyanosis, or edema.  Musculoskeletal: no joint swelling, erythema, warmth, or tenderness.  ROM of all joints intact. Skin - no sores or suspicious lesions or rashes or color changes   Pertinent labs:  Lab Results  Component Value Date   TSH 0.84 09/10/2015   Lab Results  Component Value Date   WBC 9.2 06/01/2017   HGB 15.9 (H) 06/01/2017   HCT 45.8 06/01/2017   MCV 97.0 06/01/2017   PLT 253.0 06/01/2017   Lab Results  Component Value Date   CREATININE 0.69 06/01/2017   BUN 9 06/01/2017   NA 139 06/01/2017   K 3.6 06/01/2017   CL 100 06/01/2017   CO2 31 06/01/2017   No results found for: ALT, AST, GGT, ALKPHOS, BILITOT No results found for: CHOL No results found for: HDL No results found for: LDLCALC No results found for: TRIG No results found for: CHOLHDL No results found for: [id number]  ASSESSMENT AND PLAN:   1) MDD, recurrent + GAD.  She now has adjustment d/o with anxious and depressed mood superimposed on this. I did empathic listening today, discussed addiction with her, encouraged her to seek a support group for family members of addicts.  2) Dysphagia, progressive.  Suspect this is secondary to GERD.  Start pantoprazole 40mg  qd and refer to GI.  3) Health maintenance exam: Reviewed age and gender appropriate health maintenance issues (prudent diet, regular exercise, health risks of tobacco and excessive alcohol, use of seatbelts, fire alarms  in home, use of sunscreen).  Also reviewed age and gender appropriate health screening as well as vaccine recommendations. Vaccines:she declined flu vaccine.  Tdap given today. Labs:  Fasting HP (she is fasting), HIV and Hep C  screening. Cervical ca screening: pt declined this. Breast ca screening: ordered mammogram today. Colon ca screening: next colonoscopy due 2023.  Spent 45 min with pt today, with >50% of this time spent in counseling and care coordination regarding the above problems.  An After Visit Summary was printed and given to the patient.  FOLLOW UP:  Return for  2-3 weeks to discuss toenails and other c/o (30 min).  Signed:  Santiago BumpersPhil Maxine Fredman, MD           12/27/2018

## 2018-12-27 NOTE — Patient Instructions (Signed)

## 2018-12-28 LAB — COMPREHENSIVE METABOLIC PANEL
ALT: 19 U/L (ref 0–35)
AST: 20 U/L (ref 0–37)
Albumin: 4.5 g/dL (ref 3.5–5.2)
Alkaline Phosphatase: 90 U/L (ref 39–117)
BUN: 14 mg/dL (ref 6–23)
CHLORIDE: 100 meq/L (ref 96–112)
CO2: 29 meq/L (ref 19–32)
Calcium: 9.8 mg/dL (ref 8.4–10.5)
Creatinine, Ser: 0.86 mg/dL (ref 0.40–1.20)
GFR: 68.66 mL/min (ref >60.00)
GLUCOSE: 92 mg/dL (ref 70–99)
POTASSIUM: 3.7 meq/L (ref 3.5–5.1)
SODIUM: 138 meq/L (ref 135–145)
Total Bilirubin: 0.5 mg/dL (ref 0.2–1.2)
Total Protein: 7.2 g/dL (ref 6.0–8.3)

## 2018-12-28 LAB — CBC WITH DIFFERENTIAL/PLATELET
BASOS PCT: 0.9 % (ref 0.0–3.0)
Basophils Absolute: 0.1 10*3/uL (ref 0.0–0.1)
EOS PCT: 1.9 % (ref 0.0–5.0)
Eosinophils Absolute: 0.2 10*3/uL (ref 0.0–0.7)
HCT: 45.8 % (ref 36.0–46.0)
HEMOGLOBIN: 15.6 g/dL — AB (ref 12.0–15.0)
Lymphocytes Relative: 24 % (ref 12.0–46.0)
Lymphs Abs: 2.8 10*3/uL (ref 0.7–4.0)
MCHC: 34.1 g/dL (ref 30.0–36.0)
MCV: 96.1 fl (ref 78.0–100.0)
MONOS PCT: 5.5 % (ref 3.0–12.0)
Monocytes Absolute: 0.7 10*3/uL (ref 0.1–1.0)
Neutro Abs: 8 10*3/uL — ABNORMAL HIGH (ref 1.4–7.7)
Neutrophils Relative %: 67.7 % (ref 43.0–77.0)
Platelets: 262 10*3/uL (ref 150.0–400.0)
RBC: 4.76 Mil/uL (ref 3.87–5.11)
RDW: 13.2 % (ref 11.5–15.5)
WBC: 11.8 10*3/uL — AB (ref 4.0–10.5)

## 2018-12-28 LAB — LIPID PANEL
CHOL/HDL RATIO: 3
Cholesterol: 184 mg/dL (ref 0–200)
HDL: 58.6 mg/dL (ref >39.00)
LDL CALC: 98 mg/dL (ref 0–99)
NONHDL: 125.47
Triglycerides: 136 mg/dL (ref 0.0–149.0)
VLDL: 27.2 mg/dL (ref 0.0–40.0)

## 2018-12-28 LAB — HIV ANTIBODY (ROUTINE TESTING W REFLEX): HIV 1&2 Ab, 4th Generation: NONREACTIVE

## 2018-12-28 LAB — HEPATITIS C ANTIBODY
Hepatitis C Ab: NONREACTIVE
SIGNAL TO CUT-OFF: 0.01 (ref <1.00)

## 2018-12-29 ENCOUNTER — Other Ambulatory Visit: Payer: Self-pay | Admitting: Family Medicine

## 2018-12-29 DIAGNOSIS — M7541 Impingement syndrome of right shoulder: Secondary | ICD-10-CM

## 2018-12-29 DIAGNOSIS — M545 Low back pain, unspecified: Secondary | ICD-10-CM

## 2018-12-29 LAB — TSH: TSH: 1.52 u[IU]/mL (ref 0.35–4.50)

## 2018-12-29 MED ORDER — MELOXICAM 15 MG PO TABS: 15.0000 mg | ORAL_TABLET | Freq: Every day | ORAL | 0 refills | Status: DC

## 2018-12-29 NOTE — Telephone Encounter (Signed)
Pt requesting refill of meloxicam

## 2018-12-29 NOTE — Telephone Encounter (Signed)
Requested Prescriptions  Pending Prescriptions Disp Refills  . meloxicam (MOBIC) 15 MG tablet 90 tablet 1    Sig: Take 1 tablet (15 mg total) by mouth daily.     Analgesics:  COX2 Inhibitors Failed - 12/29/2018 11:25 AM      Failed - HGB in normal range and within 360 days    Hemoglobin  Date Value Ref Range Status  12/27/2018 15.6 (H) 12.0 - 15.0 g/dL Final         Passed - Cr in normal range and within 360 days    Creatinine, Ser  Date Value Ref Range Status  12/27/2018 0.86 0.40 - 1.20 mg/dL Final         Passed - Patient is not pregnant      Passed - Valid encounter within last 12 months    Recent Outpatient Visits          2 days ago Adjustment disorder with mixed anxiety and depressed mood   [place] [facility], Maryjean Morn, MD   7 months ago Lumbar pain   [place] [facility], Renee A, DO   10 months ago Strain of mid-back, initial encounter   [place] [facility], Maryjean Morn, MD   1 year ago Postmenopausal vaginal bleeding   [place] [facility], Maryjean Morn, MD   1 year ago Essential hypertension    [facility], Maryjean Morn, MD

## 2019-01-04 ENCOUNTER — Ambulatory Visit (INDEPENDENT_AMBULATORY_CARE_PROVIDER_SITE_OTHER): Payer: PRIVATE HEALTH INSURANCE | Admitting: Family Medicine

## 2019-01-04 ENCOUNTER — Encounter: Payer: Self-pay | Admitting: Family Medicine

## 2019-01-04 VITALS — BP 135/85 | HR 97 | Temp 98.0°F | Resp 16 | Ht 63.5 in | Wt 186.0 lb

## 2019-01-04 DIAGNOSIS — M7591 Shoulder lesion, unspecified, right shoulder: Secondary | ICD-10-CM

## 2019-01-04 DIAGNOSIS — B351 Tinea unguium: Secondary | ICD-10-CM

## 2019-01-04 DIAGNOSIS — M25511 Pain in right shoulder: Secondary | ICD-10-CM | POA: Diagnosis not present

## 2019-01-04 NOTE — Progress Notes (Addendum)
OFFICE VISIT  01/04/2019   CC:  Chief Complaint  Patient presents with  . Nail Problem    Pt noticed that her toenails on each great toe were dark in color around 12/04/18. No pain.     HPI:    Patient is a 53 y.o. Caucasian female who presents for toenail discoloration. Darkening of both big toenails noted 1 mo ago when she took off the nail polish she had on for 2 mo or so. They don't itch or hurt.  Pain in right shoulder for > 6 mo.   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 The pain extends over R deltoid.  She has been doing some R shoulder stretching and taking meloxicam and it does help some.  No neck pain.  Mild intermittent tingling in R upper arm.  Past Medical History:  Diagnosis Date  . Adult ADHD    managed by Dr. Evelene Croon  . Anxiety and depression    Managed by Dr. Evelene Croon.  Marland Kitchen BRVO (branch retinal vein occlusion) 2014   R eye 2014--followed by Dr. Luciana Axe.  MRI brain 11/19/15 showed mildly advanced generalized atrophy for age, +chronic small vessel ischemic changes, no acute finding.  . Depression   . Family history of colon cancer    11/2017 colonoscopy NORM.  Recall 5 yrs (Dr. Rhea Belton)  . Postmenopausal vaginal bleeding 05/2017   Pelvic u/s 06/2017 with thickened endometrium---referred to GYN for endometrial biopsy.  . Stroke [facility]) 2014   rt.eye    Past Surgical History:  Procedure Laterality Date  . BREAST ENHANCEMENT SURGERY    . Carotid dopplers  09/19/2015   NORMAL  . chin implant    . COLONOSCOPY  11/30/2017   NORMAL: recall 5 yrs ([facility])  . FINGER SURGERY     cyst  . NECK SURGERY  REMOTE past   lymph node excision--benign per pt report  . SALIVARY GLAND SURGERY     stone   . TMJ ARTHROPLASTY      Outpatient Medications Prior to Visit  Medication Sig Dispense Refill  . amphetamine-dextroamphetamine (ADDERALL) 20 MG tablet Take 20 mg by mouth 3 (three) times daily.    Marland Kitchen buPROPion (WELLBUTRIN SR) 150 MG 12  hr tablet Take 150 mg by mouth 2 (two) times daily.     . cyclobenzaprine (FLEXERIL) 10 MG tablet Take 1 tablet (10 mg total) by mouth 2 (two) times daily as needed for muscle spasms. 180 tablet 0  . diazepam (VALIUM) 10 MG tablet Take 0.5-1 tablets by mouth 3 (three) times daily as needed.    Marland Kitchen FLUoxetine (PROZAC) 20 MG tablet Take 20 mg by mouth 3 (three) times daily.    . hydrochlorothiazide (MICROZIDE) 12.5 MG capsule TAKE 1 CAPSULE BY MOUTH EVERY DAY 30 capsule 0  . meloxicam (MOBIC) 15 MG tablet Take 1 tablet (15 mg total) by mouth daily. 90 tablet 0  . Multiple Vitamins-Minerals (CENTRUM SILVER PO) Take 1 tablet daily by mouth.    . pantoprazole (PROTONIX) 40 MG tablet Take 1 tablet (40 mg total) by mouth daily. 30 tablet 3  . tolterodine (DETROL LA) 4 MG 24 hr capsule Take 4 mg by mouth every other day.   6   No facility-administered medications prior to visit.     Allergies  Allergen Reactions  . Penicillins Anaphylaxis  . Sulfa Antibiotics Anaphylaxis    ROS As per HPI  PE: Blood pressure 135/85, pulse 97, temperature 98  F (36.7 C), temperature source Oral, resp. rate 16, height 5' 3.5" (1.613 m), weight 186 lb (84.4 kg), SpO2 99 %. Gen: Alert, well appearing.  Patient is oriented to person, place, time, and situation. AFFECT: pleasant, lucid thought and speech. Feet: hyperpigmentation of both great toenails, with a mild amount of the same on several other toenails. These also have a small amount of white substance under the nail.  No erythema or swelling or rash. R shoulder: nontender.  She has mild TTP in upper trapezius on R.  Speeds and Yergason's neg. She has pain with active aBduction at 80 deg and this extends up to nearly 180deg. IR and ER elicit no pain.  +empty can sign.  O'brien's neg.   +Impingement signs and apprehension sign on R. Spurling's neg bilat. Neck: ROM intact w/out pain.  LABS:    Chemistry      Component Value Date/Time   NA 138 12/27/2018  1649   K 3.7 12/27/2018 1649   CL 100 12/27/2018 1649   CO2 29 12/27/2018 1649   BUN 14 12/27/2018 1649   CREATININE 0.86 12/27/2018 1649      Component Value Date/Time   CALCIUM 9.8 12/27/2018 1649   ALKPHOS 90 12/27/2018 1649   AST 20 12/27/2018 1649   ALT 19 12/27/2018 1649   BILITOT 0.5 12/27/2018 1649      IMPRESSION AND PLAN:  1) Toenails discolored: suspect onychomycosis. Reassured.  Discussed option of no tx vs oral antifungal and she chose NO TREATMENT.  2) Right shoulder pain: suspect supraspinatus tendonitis. Discussed option of steroid injection today but she is very fearful of this and declined. She was open to PT so I referred her to [facility] PT. She will continue meloxicam.  An After Visit Summary was printed and given to the patient.  FOLLOW UP: Return in about 6 months (around 07/05/2019) for routine chronic illness f/u (30 min).  Signed:  Santiago Bumpers, MD           01/04/2019

## 2019-01-08 ENCOUNTER — Encounter: Payer: BLUE CROSS/BLUE SHIELD | Admitting: Family Medicine

## 2019-02-28 ENCOUNTER — Other Ambulatory Visit: Payer: Self-pay

## 2019-02-28 ENCOUNTER — Inpatient Hospital Stay (HOSPITAL_BASED_OUTPATIENT_CLINIC_OR_DEPARTMENT_OTHER)
Admission: EM | Admit: 2019-02-28 | Discharge: 2019-03-05 | DRG: 339 | Disposition: A | Discharge status: Home / Self Care | Payer: PRIVATE HEALTH INSURANCE | Attending: General Surgery | Admitting: General Surgery

## 2019-02-28 ENCOUNTER — Encounter (HOSPITAL_BASED_OUTPATIENT_CLINIC_OR_DEPARTMENT_OTHER): Payer: Self-pay

## 2019-02-28 ENCOUNTER — Emergency Department (HOSPITAL_BASED_OUTPATIENT_CLINIC_OR_DEPARTMENT_OTHER): Payer: PRIVATE HEALTH INSURANCE

## 2019-02-28 DIAGNOSIS — J9811 Atelectasis: Secondary | ICD-10-CM | POA: Diagnosis present

## 2019-02-28 DIAGNOSIS — K219 Gastro-esophageal reflux disease without esophagitis: Secondary | ICD-10-CM | POA: Diagnosis present

## 2019-02-28 DIAGNOSIS — Z79899 Other long term (current) drug therapy: Secondary | ICD-10-CM

## 2019-02-28 DIAGNOSIS — K3532 Acute appendicitis with perforation and localized peritonitis, without abscess: Secondary | ICD-10-CM | POA: Diagnosis not present

## 2019-02-28 DIAGNOSIS — Z8673 Personal history of transient ischemic attack (TIA), and cerebral infarction without residual deficits: Secondary | ICD-10-CM

## 2019-02-28 DIAGNOSIS — Z800 Family history of malignant neoplasm of digestive organs: Secondary | ICD-10-CM

## 2019-02-28 DIAGNOSIS — Z803 Family history of malignant neoplasm of breast: Secondary | ICD-10-CM

## 2019-02-28 DIAGNOSIS — I96 Gangrene, not elsewhere classified: Secondary | ICD-10-CM | POA: Diagnosis present

## 2019-02-28 DIAGNOSIS — F17210 Nicotine dependence, cigarettes, uncomplicated: Secondary | ICD-10-CM | POA: Diagnosis present

## 2019-02-28 DIAGNOSIS — F329 Major depressive disorder, single episode, unspecified: Secondary | ICD-10-CM | POA: Diagnosis present

## 2019-02-28 DIAGNOSIS — Z8371 Family history of colonic polyps: Secondary | ICD-10-CM

## 2019-02-28 DIAGNOSIS — F909 Attention-deficit hyperactivity disorder, unspecified type: Secondary | ICD-10-CM | POA: Diagnosis present

## 2019-02-28 DIAGNOSIS — Z8349 Family history of other endocrine, nutritional and metabolic diseases: Secondary | ICD-10-CM

## 2019-02-28 DIAGNOSIS — K560 Paralytic ileus: Secondary | ICD-10-CM | POA: Diagnosis present

## 2019-02-28 DIAGNOSIS — F419 Anxiety disorder, unspecified: Secondary | ICD-10-CM | POA: Diagnosis present

## 2019-02-28 DIAGNOSIS — Z801 Family history of malignant neoplasm of trachea, bronchus and lung: Secondary | ICD-10-CM

## 2019-02-28 DIAGNOSIS — Z880 Allergy status to penicillin: Secondary | ICD-10-CM

## 2019-02-28 HISTORY — PX: APPENDECTOMY: SHX54

## 2019-02-28 LAB — URINALYSIS, MICROSCOPIC (REFLEX): WBC, UA: NONE SEEN WBC/hpf (ref 0–5)

## 2019-02-28 LAB — URINALYSIS, ROUTINE W REFLEX MICROSCOPIC
Bilirubin Urine: NEGATIVE
Glucose, UA: NEGATIVE mg/dL
Ketones, ur: 15 mg/dL — AB
Leukocytes,Ua: NEGATIVE
Nitrite: NEGATIVE
Protein, ur: NEGATIVE mg/dL
Specific Gravity, Urine: >1.03 — ABNORMAL HIGH (ref 1.005–1.030)
pH: 6 (ref 5.0–8.0)

## 2019-02-28 LAB — COMPREHENSIVE METABOLIC PANEL
ALT: 17 U/L (ref 0–44)
AST: 14 U/L — ABNORMAL LOW (ref 15–41)
Albumin: 4.2 g/dL (ref 3.5–5.0)
Alkaline Phosphatase: 79 U/L (ref 38–126)
Anion gap: 8 (ref 5–15)
BUN: 11 mg/dL (ref 6–20)
CO2: 25 mmol/L (ref 22–32)
Calcium: 9 mg/dL (ref 8.9–10.3)
Chloride: 102 mmol/L (ref 98–111)
Creatinine, Ser: 0.71 mg/dL (ref 0.44–1.00)
GFR calc non Af Amer: >60 mL/min (ref >60)
Glucose, Bld: 131 mg/dL — ABNORMAL HIGH (ref 70–99)
Potassium: 3.7 mmol/L (ref 3.5–5.1)
Sodium: 135 mmol/L (ref 135–145)
Total Bilirubin: 1.4 mg/dL — ABNORMAL HIGH (ref 0.3–1.2)
Total Protein: 7.6 g/dL (ref 6.5–8.1)

## 2019-02-28 LAB — CBC
HCT: 45.4 % (ref 36.0–46.0)
Hemoglobin: 15.3 g/dL — ABNORMAL HIGH (ref 12.0–15.0)
MCH: 32.7 pg (ref 26.0–34.0)
MCHC: 33.7 g/dL (ref 30.0–36.0)
MCV: 97 fL (ref 80.0–100.0)
PLATELETS: 234 10*3/uL (ref 150–400)
RBC: 4.68 MIL/uL (ref 3.87–5.11)
RDW: 13.1 % (ref 11.5–15.5)
WBC: 17.5 10*3/uL — ABNORMAL HIGH (ref 4.0–10.5)
nRBC: 0 % (ref 0.0–0.2)

## 2019-02-28 LAB — LIPASE, BLOOD: Lipase: 18 U/L (ref 11–51)

## 2019-02-28 MED ORDER — IOHEXOL 300 MG/ML  SOLN
100.0000 mL | Freq: Once | INTRAMUSCULAR | Status: AC | PRN
  Administered 2019-02-28: 100 mL via INTRAVENOUS

## 2019-02-28 MED ORDER — SODIUM CHLORIDE 0.9% FLUSH
3.0000 mL | Freq: Once | INTRAVENOUS | Status: DC
  Filled 2019-02-28: qty 3

## 2019-02-28 MED ORDER — SODIUM CHLORIDE 0.9 % IV BOLUS
1000.0000 mL | Freq: Once | INTRAVENOUS | Status: AC
  Administered 2019-02-28: 1000 mL via INTRAVENOUS

## 2019-02-28 NOTE — ED Provider Notes (Signed)
[facility] EMERGENCY DEPARTMENT Provider Note   CSN: [id number] Arrival date & time: 02/28/19  2217    History   Chief Complaint Chief Complaint  Patient presents with   Abdominal Pain    HPI [name] is a 53 y.o. female.     53 y.o female with a PMH of ADHD, Stroke, Depression, anxiety presents to the ED with a chief complaint of abdominal since this morning. Patient reports waking up this morning and having some constant sharp lower abdominal pain which radiates to her RLQ. She endorses some nausea but denies any vomiting. She attempted taking valium 30mg  around 1pm, reports "this helps me take the edge off, I had a lot going on with my son". She has a bowel movement this morning states she had blood in her stool. She reports her last meal was around 1pm, where she had a small pastry. She denies any fever, previous surgical history to the abdomen. She denies any hematuria, chest pain, shortness of breath or gynecological complaint.      Past Medical History:  Diagnosis Date   Adult ADHD    managed by Dr. Evelene Croon   Anxiety and depression    Managed by Dr. Evelene Croon.   BRVO (branch retinal vein occlusion) 2014   R eye 2014--followed by Dr. Luciana Axe.  MRI brain 11/19/15 showed mildly advanced generalized atrophy for age, +chronic small vessel ischemic changes, no acute finding.   Depression    Family history of colon cancer    11/2017 colonoscopy NORM.  Recall 5 yrs (Dr. Rhea Belton)   Postmenopausal vaginal bleeding 05/2017   Pelvic u/s 06/2017 with thickened endometrium---referred to GYN for endometrial biopsy.   Stroke [facility]) 2014   rt.eye    Patient Active Problem List   Diagnosis Date Noted   Mood disorder (HCC) 10/23/2015   Depression 09/10/2015   Unspecified essential hypertension 01/15/2014   ADD (attention deficit disorder) 01/15/2014   Anxiety state, unspecified 01/15/2014    Past Surgical History:  Procedure Laterality Date   BREAST  ENHANCEMENT SURGERY     Carotid dopplers  09/19/2015   NORMAL   chin implant     COLONOSCOPY  11/30/2017   NORMAL: recall 5 yrs ([facility])   FINGER SURGERY     cyst   NECK SURGERY  REMOTE past   lymph node excision--benign per pt report   SALIVARY GLAND SURGERY     stone    TMJ ARTHROPLASTY       OB History    Gravida  2   Para  2   Term      Preterm      AB      Living        SAB      TAB      Ectopic      Multiple      Live Births               Home Medications    Prior to Admission medications   Medication Sig Start Date End Date Taking? Authorizing Provider  amphetamine-dextroamphetamine (ADDERALL) 20 MG tablet Take 20 mg by mouth 3 (three) times daily.    [provider]  buPROPion (WELLBUTRIN SR) 150 MG 12 hr tablet Take 150 mg by mouth 2 (two) times daily.  01/09/15   [provider]  cyclobenzaprine (FLEXERIL) 10 MG tablet Take 1 tablet (10 mg total) by mouth 2 (two) times daily as needed for muscle spasms. 09/14/18  McGowen, Maryjean MornPhilip H, MD  diazepam (VALIUM) 10 MG tablet Take 0.5-1 tablets by mouth 3 (three) times daily as needed. 12/14/18   [provider]  FLUoxetine (PROZAC) 20 MG tablet Take 20 mg by mouth 3 (three) times daily.    [provider]  hydrochlorothiazide (MICROZIDE) 12.5 MG capsule TAKE 1 CAPSULE BY MOUTH EVERY DAY 06/20/18   McGowen, Maryjean MornPhilip H, MD  meloxicam (MOBIC) 15 MG tablet Take 1 tablet (15 mg total) by mouth daily. 12/29/18   McGowen, Maryjean MornPhilip H, MD  Multiple Vitamins-Minerals (CENTRUM SILVER PO) Take 1 tablet daily by mouth.    [provider]  pantoprazole (PROTONIX) 40 MG tablet Take 1 tablet (40 mg total) by mouth daily. 12/27/18   McGowen, Maryjean MornPhilip H, MD  tolterodine (DETROL LA) 4 MG 24 hr capsule Take 4 mg by mouth every other day.  04/24/18   [provider]    Family History Family History  Problem Relation Age of Onset   Hyperlipidemia Mother    Thyroid disease  Mother    Colon polyps Mother    Stroke Father    Hypertension Father    Hyperlipidemia Father    Cancer Maternal Grandmother        colon cancer   Healthy Sister    Healthy Son    Healthy Daughter    Colon cancer Maternal Uncle    Breast cancer Paternal Aunt    Lung cancer Paternal Aunt    Bone cancer Paternal Aunt    Colon cancer Maternal Uncle    Esophageal cancer Neg Hx    Rectal cancer Neg Hx    Stomach cancer Neg Hx     Social History Social History   Tobacco Use   Smoking status: Current Some Day Smoker    Packs/day: 0.20    Years: 2.00    Pack years: 0.40    Types: Cigarettes   Smokeless tobacco: Never Used  Substance Use Topics   Alcohol use: Yes    Alcohol/week: 0.0 standard drinks    Comment: 1-3 beers a day   Drug use: No     Allergies   Penicillins and Sulfa antibiotics   Review of Systems Review of Systems  Constitutional: Negative for chills and fever.  HENT: Negative for ear pain and sore throat.   Eyes: Negative for pain and visual disturbance.  Respiratory: Negative for cough and shortness of breath.   Cardiovascular: Negative for chest pain and palpitations.  Gastrointestinal: Positive for abdominal pain, diarrhea and nausea. Negative for vomiting.  Genitourinary: Negative for difficulty urinating, dysuria, hematuria, vaginal bleeding and vaginal discharge.  Musculoskeletal: Negative for arthralgias and back pain.  Skin: Negative for color change and rash.  Neurological: Negative for seizures and syncope.  All other systems reviewed and are negative.    Physical Exam Updated Vital Signs BP 99/65    Pulse 85    Temp 98.9 F (37.2 C) (Oral)    Resp 17    Ht 5\' 4"  (1.626 m)    Wt 85.7 kg    LMP 12/11/2013    SpO2 96%    BMI 32.44 kg/m   Physical Exam Vitals signs and nursing note reviewed.  Constitutional:      General: She is not in acute distress.    Appearance: She is well-developed.  HENT:     Head:  Normocephalic and atraumatic.     Mouth/Throat:     Pharynx: No oropharyngeal exudate.  Eyes:     Pupils: Pupils  are equal, round, and reactive to light.  Neck:     Musculoskeletal: Normal range of motion.  Cardiovascular:     Rate and Rhythm: Regular rhythm.     Heart sounds: Normal heart sounds.  Pulmonary:     Effort: Pulmonary effort is normal. No respiratory distress.     Breath sounds: Normal breath sounds.  Abdominal:     General: Bowel sounds are normal. There is no distension.     Palpations: Abdomen is soft.     Tenderness: There is abdominal tenderness in the right lower quadrant, periumbilical area and suprapubic area. There is no right CVA tenderness, left CVA tenderness, guarding or rebound. Positive signs include McBurney's sign.  Musculoskeletal:        General: No tenderness or deformity.     Right lower leg: No edema.     Left lower leg: No edema.  Skin:    General: Skin is warm and dry.  Neurological:     Mental Status: She is alert and oriented to person, place, and time.      ED Treatments / Results  Labs (all labs ordered are listed, but only abnormal results are displayed) Labs Reviewed  CBC - Abnormal; Notable for the following components:      Result Value   WBC 17.5 (*)    Hemoglobin 15.3 (*)    All other components within normal limits  URINALYSIS, ROUTINE W REFLEX MICROSCOPIC - Abnormal; Notable for the following components:   Specific Gravity, Urine >1.030 (*)    Hgb urine dipstick TRACE (*)    Ketones, ur 15 (*)    All other components within normal limits  COMPREHENSIVE METABOLIC PANEL - Abnormal; Notable for the following components:   Glucose, Bld 131 (*)    AST 14 (*)    Total Bilirubin 1.4 (*)    All other components within normal limits  URINALYSIS, MICROSCOPIC (REFLEX) - Abnormal; Notable for the following components:   Bacteria, UA RARE (*)    All other components within normal limits  RAPID URINE DRUG SCREEN, HOSP PERFORMED -  Abnormal; Notable for the following components:   Cocaine POSITIVE (*)    Benzodiazepines POSITIVE (*)    Amphetamines POSITIVE (*)    All other components within normal limits  LIPASE, BLOOD  PREGNANCY, URINE  ETHANOL    EKG None  Radiology Ct Abdomen Pelvis W Contrast  Result Date: 03/01/2019 CLINICAL DATA:  rlq pain x 20 hrs, diarrhea, wbc=17.5, some confusion and slurred speech after taking  valium this afternooon EXAM: CT ABDOMEN AND PELVIS WITH CONTRAST TECHNIQUE: Multidetector CT imaging of the abdomen and pelvis was performed using the standard protocol following bolus administration of intravenous contrast. CONTRAST:  OMNIPAQUE IOHEXOL 300 MG/ML  SOLN COMPARISON:  None. FINDINGS: Lower chest: Mild dependent lower lobe atelectasis. Lung bases otherwise clear. Heart normal in size. Hepatobiliary: Liver normal in size and attenuation. 6 mm low-density lesion, segment 7, consistent with a cyst. No other liver masses or lesions. Gallbladder mildly distended. No stones. No inflammation. Common bile duct dilated to 1 cm. No evidence of a duct stone. Pancreas: Unremarkable. No pancreatic ductal dilatation or surrounding inflammatory changes. Spleen: Normal in size without focal abnormality. Adrenals/Urinary Tract: No adrenal masses. Kidneys normal size, orientation and position. No renal masses, stones or hydronephrosis. Normal ureters. Normal bladder. Stomach/Bowel: Appendix is significantly dilated, to 1.7 cm, with wall thickening, appendicoliths adjacent inflammation. There is a small bubble of extraluminal air adjacent to the appendiceal tip.  No discrete fluid collection is seen to suggest an abscess. Scattered air-fluid levels are noted in the small bowel and right colon consistent with a mild adynamic ileus. No small bowel or colonic wall thickening. Stomach is unremarkable. Vascular/Lymphatic: No significant vascular findings are present. No enlarged abdominal or pelvic lymph nodes.  Reproductive: Uterus and bilateral adnexa are unremarkable. Other: Trace amount of ascites collects adjacent to the inflamed appendix and in the posterior pelvic recess. Musculoskeletal: No fracture or acute finding. No osteoblastic or osteolytic lesions. IMPRESSION: 1. Acute appendicitis. Small bubble of extraluminal air adjacent to the appendiceal tip is consistent with rupture. No evidence of an abscess. There are prominent appendicoliths at and adjacent to the appendiceal base. Critical Value/emergent results were called by telephone at the time of interpretation on 03/01/2019 at 12:03 am to Dr. Claude Manges , who verbally acknowledged these results. 2. Scattered colonic and small bowel are fluid levels consistent with a reactive adynamic ileus. Trace amount of ascites. 3. No other acute abnormality. Electronically Signed   By: Amie Portland M.D.   On: 03/01/2019 00:05    Procedures Procedures (including critical care time)  Medications Ordered in ED Medications  sodium chloride flush (NS) 0.9 % injection 3 mL (3 mLs Intravenous Not Given 02/28/19 2231)  ciprofloxacin (CIPRO) IVPB 400 mg (400 mg Intravenous New Bag/Given 03/01/19 0017)    And  metroNIDAZOLE (FLAGYL) IVPB 500 mg (has no administration in time range)  sodium chloride 0.9 % bolus 1,000 mL (0 mLs Intravenous Stopped 03/01/19 0015)  iohexol (OMNIPAQUE) 300 MG/ML solution 100 mL (100 mLs Intravenous Contrast Given 02/28/19 2339)     Initial Impression / Assessment and Plan / ED Course  I have reviewed the triage vital signs and the nursing notes.  Pertinent labs & imaging results that were available during my care of the patient were reviewed by me and considered in my medical decision making (see chart for details).       Patient with abdominal pain since this am. Has taken 30 mg of valium, reports she was trying to get the edge off.  When asked about SI, HI, patient denied.  She reports she has had family problems lately.  She  arrived in the ED with hypotension, will provide him with bolus.  Patient denies any previous surgical history to her abdomen region.  Differential diagnoses include appendicitis, diverticulitis, obstruction.  Bowel sounds are reassuring, she is afebrile.  Low suspicion for obstruction as she had multiple bowel movements today with diarrhea.  Does report some blood in her urine which has been going on for several months.  Patient does have slurred speech during evaluation, per family member this is patient's baseline after taking Valium.  She also has a noted right facial droop, patient has a previous history of chin implant which is consistent with these changes to her face.  This information was verified with daughter at the bedside.  CBC was remarkable for leukocytosis of 17.5, hemoglobin slightly increased.  CMP showed no electrolyte abnormality, creatinine level is within normal limits.  Urinalysis showed ketones 15, negative for nitrites or leukocytes.  No white blood cells seen, bacteria is rare.  Lipase is within normal limits.  12:09 AM spoke to radiologist who stated patient likely having acute appendicitis with perforation, no abscess formation at this time.  Will place call for general surgery, patient to be transfer. Will start antibiotics at this time.   12:14 AM spoke to Dr. Marcille Blanco who will direct admit  to [facility].  He reports patient will likely have surgery tomorrow morning.  She is to be kept NPO  Antibiotics have been started at this time.  A UDS was added due to patient's slurred speech, she was positive for cocaine, benzos, amphetamines.  Final Clinical Impressions(s) / ED Diagnoses   Final diagnoses:  Acute perforated appendicitis    ED Discharge Orders    None       Claude Manges, PA-C 03/01/19 0018    Maia Plan, MD 03/01/19 1640

## 2019-02-28 NOTE — ED Triage Notes (Signed)
Pt arrives via [facility] with RLQ pain. Pain began this AM- watery diarrhea-denies vomiting. Per EMS- pt took 30mg  valium at 1pm and SpO2 stats will drop if pt goes to sleep. Pt also had gabapentin-acetaminophen-gas x. Pt a/o x 4. Pt has slurred speech. Pt from home.

## 2019-02-28 NOTE — ED Notes (Signed)
Notified lab to add on pregnancy

## 2019-03-01 ENCOUNTER — Encounter (HOSPITAL_COMMUNITY): Admission: EM | Disposition: A | Discharge status: Home / Self Care | Payer: Self-pay | Source: Home / Self Care

## 2019-03-01 ENCOUNTER — Observation Stay (HOSPITAL_COMMUNITY): Payer: PRIVATE HEALTH INSURANCE | Admitting: Certified Registered Nurse Anesthetist

## 2019-03-01 ENCOUNTER — Encounter (HOSPITAL_COMMUNITY): Payer: Self-pay

## 2019-03-01 DIAGNOSIS — K3532 Acute appendicitis with perforation and localized peritonitis, without abscess: Secondary | ICD-10-CM | POA: Diagnosis present

## 2019-03-01 DIAGNOSIS — I96 Gangrene, not elsewhere classified: Secondary | ICD-10-CM | POA: Diagnosis present

## 2019-03-01 DIAGNOSIS — K560 Paralytic ileus: Secondary | ICD-10-CM | POA: Diagnosis present

## 2019-03-01 DIAGNOSIS — Z79899 Other long term (current) drug therapy: Secondary | ICD-10-CM | POA: Diagnosis not present

## 2019-03-01 DIAGNOSIS — F329 Major depressive disorder, single episode, unspecified: Secondary | ICD-10-CM | POA: Diagnosis present

## 2019-03-01 DIAGNOSIS — J9811 Atelectasis: Secondary | ICD-10-CM | POA: Diagnosis present

## 2019-03-01 DIAGNOSIS — Z803 Family history of malignant neoplasm of breast: Secondary | ICD-10-CM | POA: Diagnosis not present

## 2019-03-01 DIAGNOSIS — Z800 Family history of malignant neoplasm of digestive organs: Secondary | ICD-10-CM | POA: Diagnosis not present

## 2019-03-01 DIAGNOSIS — Z8349 Family history of other endocrine, nutritional and metabolic diseases: Secondary | ICD-10-CM | POA: Diagnosis not present

## 2019-03-01 DIAGNOSIS — F419 Anxiety disorder, unspecified: Secondary | ICD-10-CM | POA: Diagnosis present

## 2019-03-01 DIAGNOSIS — Z8673 Personal history of transient ischemic attack (TIA), and cerebral infarction without residual deficits: Secondary | ICD-10-CM | POA: Diagnosis not present

## 2019-03-01 DIAGNOSIS — F17210 Nicotine dependence, cigarettes, uncomplicated: Secondary | ICD-10-CM | POA: Diagnosis present

## 2019-03-01 DIAGNOSIS — Z880 Allergy status to penicillin: Secondary | ICD-10-CM | POA: Diagnosis not present

## 2019-03-01 DIAGNOSIS — F909 Attention-deficit hyperactivity disorder, unspecified type: Secondary | ICD-10-CM | POA: Diagnosis present

## 2019-03-01 DIAGNOSIS — K219 Gastro-esophageal reflux disease without esophagitis: Secondary | ICD-10-CM | POA: Diagnosis present

## 2019-03-01 DIAGNOSIS — Z8371 Family history of colonic polyps: Secondary | ICD-10-CM | POA: Diagnosis not present

## 2019-03-01 DIAGNOSIS — Z801 Family history of malignant neoplasm of trachea, bronchus and lung: Secondary | ICD-10-CM | POA: Diagnosis not present

## 2019-03-01 HISTORY — PX: LAPAROSCOPIC APPENDECTOMY: SHX408

## 2019-03-01 LAB — RAPID URINE DRUG SCREEN, HOSP PERFORMED
AMPHETAMINES: POSITIVE — AB
Barbiturates: NOT DETECTED
Benzodiazepines: POSITIVE — AB
Cocaine: POSITIVE — AB
Opiates: NOT DETECTED
Tetrahydrocannabinol: NOT DETECTED

## 2019-03-01 LAB — MRSA PCR SCREENING: MRSA by PCR: NEGATIVE

## 2019-03-01 LAB — PREGNANCY, URINE: Preg Test, Ur: NEGATIVE

## 2019-03-01 LAB — ETHANOL

## 2019-03-01 SURGERY — APPENDECTOMY, LAPAROSCOPIC
Anesthesia: General | Site: Abdomen

## 2019-03-01 MED ORDER — METRONIDAZOLE IN NACL 5-0.79 MG/ML-% IV SOLN
500.0000 mg | Freq: Three times a day (TID) | INTRAVENOUS | Status: DC
  Administered 2019-03-01: 500 mg via INTRAVENOUS
  Filled 2019-03-01: qty 100

## 2019-03-01 MED ORDER — PROPOFOL 10 MG/ML IV BOLUS
INTRAVENOUS | Status: AC
  Filled 2019-03-01: qty 20

## 2019-03-01 MED ORDER — ENOXAPARIN SODIUM 40 MG/0.4ML ~~LOC~~ SOLN
40.0000 mg | SUBCUTANEOUS | Status: DC
  Administered 2019-03-02 – 2019-03-05 (×4): 40 mg via SUBCUTANEOUS
  Filled 2019-03-01 (×4): qty 0.4

## 2019-03-01 MED ORDER — METRONIDAZOLE IN NACL 5-0.79 MG/ML-% IV SOLN
500.0000 mg | Freq: Three times a day (TID) | INTRAVENOUS | Status: DC
  Administered 2019-03-01 – 2019-03-05 (×11): 500 mg via INTRAVENOUS
  Filled 2019-03-01 (×12): qty 100

## 2019-03-01 MED ORDER — ONDANSETRON HCL 4 MG/2ML IJ SOLN
INTRAMUSCULAR | Status: DC | PRN
  Administered 2019-03-01: 4 mg via INTRAVENOUS

## 2019-03-01 MED ORDER — PROPOFOL 10 MG/ML IV BOLUS
INTRAVENOUS | Status: DC | PRN
  Administered 2019-03-01: 160 mg via INTRAVENOUS

## 2019-03-01 MED ORDER — FENTANYL CITRATE (PF) 100 MCG/2ML IJ SOLN
INTRAMUSCULAR | Status: AC
  Filled 2019-03-01: qty 2

## 2019-03-01 MED ORDER — PROMETHAZINE HCL 25 MG/ML IJ SOLN: 6.2500 mg | INTRAMUSCULAR | Status: DC | PRN

## 2019-03-01 MED ORDER — OXYCODONE HCL 5 MG PO TABS
5.0000 mg | ORAL_TABLET | ORAL | Status: DC | PRN
  Administered 2019-03-02 – 2019-03-03 (×2): 5 mg via ORAL
  Administered 2019-03-03: 10 mg via ORAL
  Administered 2019-03-04: 5 mg via ORAL
  Administered 2019-03-04 – 2019-03-05 (×2): 10 mg via ORAL
  Filled 2019-03-01 (×3): qty 2
  Filled 2019-03-01 (×3): qty 1
  Filled 2019-03-01: qty 2

## 2019-03-01 MED ORDER — BUPIVACAINE-EPINEPHRINE 0.25% -1:200000 IJ SOLN
INTRAMUSCULAR | Status: DC | PRN
  Administered 2019-03-01: 20 mL

## 2019-03-01 MED ORDER — MIDAZOLAM HCL 2 MG/2ML IJ SOLN
INTRAMUSCULAR | Status: AC
  Filled 2019-03-01: qty 2

## 2019-03-01 MED ORDER — CIPROFLOXACIN IN D5W 400 MG/200ML IV SOLN
400.0000 mg | Freq: Once | INTRAVENOUS | Status: AC
  Administered 2019-03-01: 400 mg via INTRAVENOUS
  Filled 2019-03-01: qty 200

## 2019-03-01 MED ORDER — CIPROFLOXACIN IN D5W 400 MG/200ML IV SOLN
400.0000 mg | Freq: Two times a day (BID) | INTRAVENOUS | Status: DC
  Administered 2019-03-01: 400 mg via INTRAVENOUS
  Filled 2019-03-01: qty 200

## 2019-03-01 MED ORDER — LIDOCAINE 2% (20 MG/ML) 5 ML SYRINGE
INTRAMUSCULAR | Status: AC
  Filled 2019-03-01: qty 5

## 2019-03-01 MED ORDER — METRONIDAZOLE IN NACL 5-0.79 MG/ML-% IV SOLN
500.0000 mg | Freq: Once | INTRAVENOUS | Status: AC
  Administered 2019-03-01: 500 mg via INTRAVENOUS
  Filled 2019-03-01: qty 100

## 2019-03-01 MED ORDER — 0.9 % SODIUM CHLORIDE (POUR BTL) OPTIME
TOPICAL | Status: DC | PRN
  Administered 2019-03-01: 1000 mL

## 2019-03-01 MED ORDER — LIDOCAINE 2% (20 MG/ML) 5 ML SYRINGE
INTRAMUSCULAR | Status: DC | PRN
  Administered 2019-03-01: 60 mg via INTRAVENOUS

## 2019-03-01 MED ORDER — DEXAMETHASONE SODIUM PHOSPHATE 10 MG/ML IJ SOLN
INTRAMUSCULAR | Status: AC
  Filled 2019-03-01: qty 1

## 2019-03-01 MED ORDER — FENTANYL CITRATE (PF) 100 MCG/2ML IJ SOLN: 25.0000 ug | INTRAMUSCULAR | Status: DC | PRN

## 2019-03-01 MED ORDER — ONDANSETRON HCL 4 MG/2ML IJ SOLN: 4.0000 mg | Freq: Four times a day (QID) | INTRAMUSCULAR | Status: DC | PRN

## 2019-03-01 MED ORDER — MORPHINE SULFATE (PF) 2 MG/ML IV SOLN
2.0000 mg | INTRAVENOUS | Status: DC | PRN
  Administered 2019-03-01: 4 mg via INTRAVENOUS
  Filled 2019-03-01 (×2): qty 1

## 2019-03-01 MED ORDER — ONDANSETRON 4 MG PO TBDP: 4.0000 mg | ORAL_TABLET | Freq: Four times a day (QID) | ORAL | Status: DC | PRN

## 2019-03-01 MED ORDER — DIPHENHYDRAMINE HCL 50 MG/ML IJ SOLN: 25.0000 mg | Freq: Four times a day (QID) | INTRAMUSCULAR | Status: DC | PRN

## 2019-03-01 MED ORDER — ACETAMINOPHEN 500 MG PO TABS
1000.0000 mg | ORAL_TABLET | Freq: Four times a day (QID) | ORAL | Status: DC
  Administered 2019-03-01 – 2019-03-05 (×14): 1000 mg via ORAL
  Filled 2019-03-01 (×15): qty 2

## 2019-03-01 MED ORDER — MIDAZOLAM HCL 5 MG/5ML IJ SOLN
INTRAMUSCULAR | Status: DC | PRN
  Administered 2019-03-01: 2 mg via INTRAVENOUS

## 2019-03-01 MED ORDER — SUCCINYLCHOLINE CHLORIDE 200 MG/10ML IV SOSY
PREFILLED_SYRINGE | INTRAVENOUS | Status: DC | PRN
  Administered 2019-03-01: 100 mg via INTRAVENOUS

## 2019-03-01 MED ORDER — LACTATED RINGERS IV SOLN
INTRAVENOUS | Status: AC | PRN
  Administered 2019-03-01: 1000 mL

## 2019-03-01 MED ORDER — LACTATED RINGERS IV SOLN
INTRAVENOUS | Status: DC
  Administered 2019-03-01: 12:00:00 via INTRAVENOUS

## 2019-03-01 MED ORDER — BUPIVACAINE-EPINEPHRINE (PF) 0.25% -1:200000 IJ SOLN
INTRAMUSCULAR | Status: AC
  Filled 2019-03-01: qty 30

## 2019-03-01 MED ORDER — DIPHENHYDRAMINE HCL 25 MG PO CAPS: 25.0000 mg | ORAL_CAPSULE | Freq: Four times a day (QID) | ORAL | Status: DC | PRN

## 2019-03-01 MED ORDER — ONDANSETRON HCL 4 MG/2ML IJ SOLN
INTRAMUSCULAR | Status: AC
  Filled 2019-03-01: qty 2

## 2019-03-01 MED ORDER — SUGAMMADEX SODIUM 500 MG/5ML IV SOLN
INTRAVENOUS | Status: DC | PRN
  Administered 2019-03-01: 350 mg via INTRAVENOUS

## 2019-03-01 MED ORDER — SUGAMMADEX SODIUM 500 MG/5ML IV SOLN
INTRAVENOUS | Status: AC
  Filled 2019-03-01: qty 5

## 2019-03-01 MED ORDER — POTASSIUM CHLORIDE IN NACL 20-0.9 MEQ/L-% IV SOLN
INTRAVENOUS | Status: DC
  Administered 2019-03-01 – 2019-03-05 (×7): via INTRAVENOUS
  Filled 2019-03-01 (×10): qty 1000

## 2019-03-01 MED ORDER — GENTAMICIN SULFATE 40 MG/ML IJ SOLN
440.0000 mg | Freq: Once | INTRAVENOUS | Status: AC
  Administered 2019-03-01: 440 mg via INTRAVENOUS
  Filled 2019-03-01: qty 11

## 2019-03-01 MED ORDER — IBUPROFEN 200 MG PO TABS: 600.0000 mg | ORAL_TABLET | Freq: Four times a day (QID) | ORAL | Status: DC | PRN

## 2019-03-01 MED ORDER — ENOXAPARIN SODIUM 40 MG/0.4ML ~~LOC~~ SOLN: 40.0000 mg | SUBCUTANEOUS | Status: DC

## 2019-03-01 MED ORDER — DEXAMETHASONE SODIUM PHOSPHATE 10 MG/ML IJ SOLN
INTRAMUSCULAR | Status: DC | PRN
  Administered 2019-03-01: 10 mg via INTRAVENOUS

## 2019-03-01 MED ORDER — ROCURONIUM BROMIDE 50 MG/5ML IV SOSY
PREFILLED_SYRINGE | INTRAVENOUS | Status: DC | PRN
  Administered 2019-03-01: 50 mg via INTRAVENOUS

## 2019-03-01 MED ORDER — ACETAMINOPHEN 500 MG PO TABS
1000.0000 mg | ORAL_TABLET | Freq: Once | ORAL | Status: AC
  Administered 2019-03-01: 1000 mg via ORAL
  Filled 2019-03-01: qty 2

## 2019-03-01 MED ORDER — FENTANYL CITRATE (PF) 100 MCG/2ML IJ SOLN
INTRAMUSCULAR | Status: DC | PRN
  Administered 2019-03-01 (×3): 50 ug via INTRAVENOUS

## 2019-03-01 MED ORDER — CIPROFLOXACIN IN D5W 400 MG/200ML IV SOLN
400.0000 mg | Freq: Two times a day (BID) | INTRAVENOUS | Status: DC
  Administered 2019-03-01 – 2019-03-04 (×7): 400 mg via INTRAVENOUS
  Filled 2019-03-01 (×8): qty 200

## 2019-03-01 MED ORDER — ROCURONIUM BROMIDE 10 MG/ML (PF) SYRINGE
PREFILLED_SYRINGE | INTRAVENOUS | Status: AC
  Filled 2019-03-01: qty 10

## 2019-03-01 SURGICAL SUPPLY — 41 items
ADH SKN CLS APL DERMABOND .7 (GAUZE/BANDAGES/DRESSINGS) ×1
APPLIER CLIP ROT 10 11.4 M/L (STAPLE)
APR CLP MED LRG 11.4X10 (STAPLE)
BAG SPEC RTRVL LRG 6X4 10 (ENDOMECHANICALS) ×1
CHLORAPREP W/TINT 26ML (MISCELLANEOUS) ×3 IMPLANT
CLIP APPLIE ROT 10 11.4 M/L (STAPLE) IMPLANT
CLOSURE WOUND 1/2 X4 (GAUZE/BANDAGES/DRESSINGS) ×1
COVER SURGICAL LIGHT HANDLE (MISCELLANEOUS) ×3 IMPLANT
COVER WAND RF STERILE (DRAPES) IMPLANT
CUTTER FLEX LINEAR 45M (STAPLE) ×2 IMPLANT
DECANTER SPIKE VIAL GLASS SM (MISCELLANEOUS) ×3 IMPLANT
DERMABOND ADVANCED (GAUZE/BANDAGES/DRESSINGS) ×2
DERMABOND ADVANCED .7 DNX12 (GAUZE/BANDAGES/DRESSINGS) ×1 IMPLANT
DRAIN CHANNEL 19F RND (DRAIN) ×2 IMPLANT
DRAPE LAPAROSCOPIC ABDOMINAL (DRAPES) ×3 IMPLANT
ELECT REM PT RETURN 15FT ADLT (MISCELLANEOUS) ×3 IMPLANT
ENDOLOOP SUT PDS II  0 18 (SUTURE)
ENDOLOOP SUT PDS II 0 18 (SUTURE) IMPLANT
EVACUATOR SILICONE 100CC (DRAIN) ×2 IMPLANT
GLOVE SURG ORTHO 8.0 STRL STRW (GLOVE) ×3 IMPLANT
GOWN STRL REUS W/TWL XL LVL3 (GOWN DISPOSABLE) ×6 IMPLANT
KIT BASIN OR (CUSTOM PROCEDURE TRAY) ×3 IMPLANT
KIT TURNOVER KIT A (KITS) IMPLANT
POUCH SPECIMEN RETRIEVAL 10MM (ENDOMECHANICALS) ×3 IMPLANT
RELOAD 45 VASCULAR/THIN (ENDOMECHANICALS) IMPLANT
RELOAD STAPLE 45 2.5 WHT GRN (ENDOMECHANICALS) IMPLANT
RELOAD STAPLE 45 3.5 BLU ETS (ENDOMECHANICALS) IMPLANT
RELOAD STAPLE TA45 3.5 REG BLU (ENDOMECHANICALS) ×3 IMPLANT
SET IRRIG TUBING LAPAROSCOPIC (IRRIGATION / IRRIGATOR) ×3 IMPLANT
SET TUBE SMOKE EVAC HIGH FLOW (TUBING) ×3 IMPLANT
SHEARS HARMONIC ACE PLUS 36CM (ENDOMECHANICALS) ×3 IMPLANT
STRIP CLOSURE SKIN 1/2X4 (GAUZE/BANDAGES/DRESSINGS) ×2 IMPLANT
SUT ETHILON 2 0 PS N (SUTURE) ×2 IMPLANT
SUT MNCRL AB 4-0 PS2 18 (SUTURE) ×3 IMPLANT
TOWEL OR 17X26 10 PK STRL BLUE (TOWEL DISPOSABLE) ×3 IMPLANT
TOWEL OR NON WOVEN STRL DISP B (DISPOSABLE) ×3 IMPLANT
TRAY FOLEY MTR SLVR 14FR STAT (SET/KITS/TRAYS/PACK) ×2 IMPLANT
TRAY LAPAROSCOPIC (CUSTOM PROCEDURE TRAY) ×3 IMPLANT
TROCAR BLADELESS OPT 5 100 (ENDOMECHANICALS) ×2 IMPLANT
TROCAR XCEL BLUNT TIP 100MML (ENDOMECHANICALS) ×3 IMPLANT
TROCAR XCEL NON-BLD 11X100MML (ENDOMECHANICALS) ×3 IMPLANT

## 2019-03-01 NOTE — Progress Notes (Signed)
Pharmacy Antibiotic Note  Haley Martin is a 53 y.o. female admitted on 02/28/2019 with acute appendicitis .  Pharmacy has been consulted for ciproflaxacin dosing. Ciprofloxacin 400mg  and Flagyl 500mg  x1 at [facility]. Workup for surgery, UDS + for Cocaine, Benzodiazepines, Amphetamines.  Plan: Ciprofloxacin 400mg  IV q12h Flagyl 500mg  IV q8h per MD Pharmacy will sign off as renal adjustment unlikely, will follow peripherally   Height: 5\' 4"  (162.6 cm) Weight: 189 lb (85.7 kg) IBW/kg (Calculated) : 54.7  Temp (24hrs), Avg:99.8 F (37.7 C), Min:98.9 F (37.2 C), Max:100.4 F (38 C)  Recent Labs  Lab 02/28/19 2223 02/28/19 2241  WBC 17.5*  --   CREATININE  --  0.71    Estimated Creatinine Clearance: 85.2 mL/min (by C-G formula based on SCr of 0.71 mg/dL).    Allergies  Allergen Reactions  . Penicillins Anaphylaxis  . Sulfa Antibiotics Anaphylaxis   Antimicrobials this admission: 3/19 cipro >>  3/19 Flagyl IV>> 3/19 Gentamycin x1  Dose adjustments this admission:  Microbiology results: 3/19 MRSA PCR: sent  Thank you for allowing pharmacy to be a part of this patient's care.  Arley Phenix RPh 03/01/2019, 8:38 AM Pager [phone]

## 2019-03-01 NOTE — Anesthesia Preprocedure Evaluation (Addendum)
Anesthesia Evaluation  Patient identified by MRN, date of birth, ID band Patient awake    Reviewed: Allergy & Precautions, NPO status , Patient's Chart, lab work & pertinent test results  Airway Mallampati: II  TM Distance: >3 FB Neck ROM: Full    Dental  (+) Dental Advisory Given, Poor Dentition   Pulmonary Current Smoker,    Pulmonary exam normal breath sounds clear to auscultation       Cardiovascular hypertension, Pt. on medications (-) angina(-) Past MI Normal cardiovascular exam Rhythm:Regular Rate:Normal     Neuro/Psych PSYCHIATRIC DISORDERS Anxiety Depression CVA, Residual Symptoms    GI/Hepatic GERD  Medicated,(+)     substance abuse  cocaine use and methamphetamine use, Appendicitis    Endo/Other  Obesity   Renal/GU negative Renal ROS     Musculoskeletal negative musculoskeletal ROS (+) narcotic dependent  Abdominal   Peds  (+) ADHD Hematology negative hematology ROS (+)   Anesthesia Other Findings Day of surgery medications reviewed with the patient.  Reproductive/Obstetrics                            Anesthesia Physical Anesthesia Plan  ASA: III and emergent  Anesthesia Plan: General   Post-op Pain Management:    Induction: Intravenous  PONV Risk Score and Plan: 3 and Midazolam, Dexamethasone and Ondansetron  Airway Management Planned: Oral ETT  Additional Equipment:   Intra-op Plan:   Post-operative Plan: Extubation in OR  Informed Consent: I have reviewed the patients History and Physical, chart, labs and discussed the procedure including the risks, benefits and alternatives for the proposed anesthesia with the patient or authorized representative who has indicated his/her understanding and acceptance.     Dental advisory given  Plan Discussed with: CRNA  Anesthesia Plan Comments:         Anesthesia Quick Evaluation

## 2019-03-01 NOTE — Anesthesia Procedure Notes (Signed)
Procedure Name: Intubation Date/Time: 03/01/2019 12:13 PM Performed by: Wynonia Sours, CRNA Pre-anesthesia Checklist: Patient identified, Emergency Drugs available, Suction available, Patient being monitored and Timeout performed Patient Re-evaluated:Patient Re-evaluated prior to induction Oxygen Delivery Method: Circle system utilized Preoxygenation: Pre-oxygenation with 100% oxygen Induction Type: IV induction, Rapid sequence and Cricoid Pressure applied Laryngoscope Size: 3 and Glidescope Grade View: Grade I Tube type: Oral Tube size: 7.0 mm Number of attempts: 1 Airway Equipment and Method: Video-laryngoscopy and Rigid stylet Placement Confirmation: ETT inserted through vocal cords under direct vision,  positive ETCO2,  CO2 detector and breath sounds checked- equal and bilateral Secured at: 21 cm Tube secured with: Tape Dental Injury: Teeth and Oropharynx as per pre-operative assessment  Comments: Elective use of glidescope.

## 2019-03-01 NOTE — Progress Notes (Signed)
Pharmacy Antibiotic Note  Haley Martin is a 53 y.o. female admitted on 02/28/2019 with acute appendicitis .  Pharmacy has been consulted for Gentamycin dosing. Ciprofloxacin 400mg  and Flagyl 500mg  x1 at [facility]. Workup for surgery, UDS + for Cocaine, Benzodiazepines, Amphetamines, may delay surgery  Plan: Gentamycin 440mg  IV x1 Ciprofloxacin 400mg , Flagyl 500mg  IV x1 at [facility] Further Cipro/Flagyl orders per MD  Height: 5\' 4"  (162.6 cm) Weight: 189 lb (85.7 kg) IBW/kg (Calculated) : 54.7  Temp (24hrs), Avg:99.7 F (37.6 C), Min:98.9 F (37.2 C), Max:100.4 F (38 C)  Recent Labs  Lab 02/28/19 2223 02/28/19 2241  WBC 17.5*  --   CREATININE  --  0.71    Estimated Creatinine Clearance: 85.2 mL/min (by C-G formula based on SCr of 0.71 mg/dL).    Allergies  Allergen Reactions  . Penicillins Anaphylaxis  . Sulfa Antibiotics Anaphylaxis   Antimicrobials this admission: 3/19 Cipro/Flagyl IV x1 3/19 Gentamycin x1  Dose adjustments this admission:  Microbiology results: 3/19 MRSA PCR: sent  Thank you for allowing pharmacy to be a part of this patient's care.  Otho Bellows PharmD Pager [phone] 03/01/2019, 2:31 AM

## 2019-03-01 NOTE — H&P (Signed)
Called from [facility] with patient with acute appendicitis with possible perforation.  CT Scan and labs reviewed. Patient does not appear to be septic with stable vital signs IV antibiotics initiated.  Transfer to [facility] - NPO IV hydration PRN pain meds  Will further evaluate in AM for laparoscopic appendectomy.   UDS shows + cocaine, benzodiazepines, amphetamine.  This may delay her surgery.  Will be thoroughly evaluated in AM.  Wilmon Arms. Corliss Skains, MD, [facility]  General/ Trauma Surgery Beeper [phone]  03/01/2019 12:25 AM

## 2019-03-01 NOTE — Anesthesia Postprocedure Evaluation (Signed)
Anesthesia Post Note  Patient: Haley Martin  Procedure(s) Performed: APPENDECTOMY LAPAROSCOPIC WITH DRAIN PLACEMENT (N/A Abdomen)     Patient location during evaluation: PACU Anesthesia Type: General Level of consciousness: awake and alert, oriented and awake Pain management: pain level controlled Vital Signs Assessment: post-procedure vital signs reviewed and stable Respiratory status: spontaneous breathing, nonlabored ventilation, respiratory function stable and patient connected to nasal cannula oxygen Cardiovascular status: blood pressure returned to baseline and stable Postop Assessment: no apparent nausea or vomiting Anesthetic complications: no    Last Vitals:  Vitals:   03/01/19 1430 03/01/19 1445  BP: 119/71 124/72  Pulse: 93 94  Resp: 19 20  Temp:    SpO2: 92% 92%    Last Pain:  Vitals:   03/01/19 1415  TempSrc:   PainSc: Asleep                 Cecile Hearing

## 2019-03-01 NOTE — Op Note (Signed)
OPERATIVE REPORT - LAPAROSCOPIC APPENDECTOMY  Preop diagnosis:  Acute appendicitis  Postop diagnosis:  Perforated acute appendicitis with gangrenous base  Procedure:  Laparoscopic appendectomy with drain placement  Surgeon:  Darnell Level, MD  Anesthesia:  general endotracheal  Estimated blood loss:  minimal  Preparation:  Chlora-prep  Complications:  none  Indications:  53 year old female presented to the ED at [facility] with sharp lower abdominal pain radiating to her right lower quadrant.  Pain started yesterday AM, but got progressively worse.  Pain was mid abdomen but moving to lower mid and RLQ.  She had some nausea but no vomiting.  She took 30 mg of Valium around 1 PM yesterday.  She reported to the ED physician this "helped take the edge off, I had a lot going on with my son."  Work-up in the ED that she was initially afebrile but had a temperature 100.4 by 1 AM.  Her other vital signs were stable.  Labs showed an essentially normal CMP except for a glucose of 131, AST of 14, total bilirubin of 1.4.  WBC 17.5, hemoglobin 15.3, hematocrit 45.4, platelets 234,000.  Urinalysis was unremarkable.  Urine pregnancy was negative.  Drug screen was positive for amphetamines, benzodiazepines, and cocaine.  CT scan obtained last evening completed at 11 PM lower lobe atelectasis.  Appendix was significantly dilated to 1.7 cm with wall thickening, appendicolith adjacent to the inflammation there is a small bubble of extraluminal air adjacent to the appendiceal tip.  Discrete fluid collection or abscess was seen.  Scattered air-fluid levels were noted in the small bowel in the right colon consistent with a mild adynamic ileus there is no small bowel or colonic wall thickening.  Was consistent with acute appendicitis.  Dr. Manus Rudd was contacted patient was transferred from [facility] to [facility] long hospital.  Patient was started on Cipro and Flagyl she was also given 1 dose of  Garamycin.  She will started on IV fluids.  Procedure:  Patient is brought to the operating room and placed in a supine position on the operating room table. Following administration of general anesthesia, a time out was held and the patient's name and procedure is confirmed. Patient is then prepped and draped in the usual strict aseptic fashion.  After ascertaining that an adequate level of anesthesia has been achieved, a peri-umbilical incision is made with a #15 blade. Dissection is carried down to the fascia. Fascia is incised in the midline and the peritoneal cavity is entered cautiously. A #0-vicryl pursestring suture is placed in the fascia. An Hassan cannula is introduced under direct vision and secured with the pursestring suture. The abdomen is insufflated with carbon dioxide. The laparoscope is introduced and the abdomen is explored. Operative ports are placed in the right upper quadrant and left lower quadrant. The appendix is identified. There is gross perforation of the base of the appendix near the cecal wall.  The proximal appendix is gangrenous.  The mesoappendix is divided with the harmonic scalpel. Dissection is carried down to the base of the appendix. The base of the appendix is dissected out clearing the junction with the cecal wall. Using an Endo-GIA stapler, the base of the appendix is transected at the junction with the cecal wall. There is good approximation of tissue along the staple line. There is good hemostasis along the staple line. The appendix is placed into an endo-catch bag and withdrawn through the umbilical port. The #0-vicryl pursestring suture is tied securely.  A 19Fr Blake drain is inserted and exited through the RUQ incision.  It is secured to the skin with a 2-0 Nylon suture and placed to bulb suction.  Right lower quadrant is irrigated with warm saline which is evacuated. Good hemostasis is noted. Ports are removed under direct vision. Good hemostasis is noted at  the port sites. Pneumoperitoneum is released.  Skin incisions are anesthetized with local anesthetic. Wounds are closed with interrupted 4-0 Monocryl subcuticular sutures. Wounds are washed and dried and Dermabond was applied. The patient is awakened from anesthesia and brought to the recovery room. The patient tolerated the procedure well.  Darnell Level, MD [facility] Office: [phone]

## 2019-03-01 NOTE — Transfer of Care (Signed)
Immediate Anesthesia Transfer of Care Note  Patient: Haley Martin  Procedure(s) Performed: APPENDECTOMY LAPAROSCOPIC WITH DRAIN PLACEMENT (N/A Abdomen)  Patient Location: PACU  Anesthesia Type:General  Level of Consciousness: awake, drowsy and patient cooperative  Airway & Oxygen Therapy: Patient Spontanous Breathing and Patient connected to face mask oxygen  Post-op Assessment: Report given to RN and Post -op Vital signs reviewed and stable  Post vital signs: Reviewed and stable  Last Vitals:  Vitals Value Taken Time  BP 130/74 03/01/2019  1:18 PM  Temp    Pulse 100 03/01/2019  1:22 PM  Resp 20 03/01/2019  1:22 PM  SpO2 96 % 03/01/2019  1:22 PM  Vitals shown include unvalidated device data.  Last Pain:  Vitals:   03/01/19 1127  TempSrc: Oral  PainSc:       Patients Stated Pain Goal: 2 (03/01/19 0225)  Complications: No apparent anesthesia complications

## 2019-03-01 NOTE — H&P (Signed)
Haley Martin is an 53 y.o. female.   Chief Complaint: Diarrhea, vomiting, multiple drug use HPI: 53 year old female presented to the ED at [facility] with sharp lower abdominal pain radiating to her right lower quadrant.  Pain started yesterday AM, but got progressively worse.  Pain was mid abdomen but moving to lower mid and RLQ.  She had some nausea but no vomiting.  She took 30 mg of Valium around 1 PM yesterday.  She reported to the ED physician this "helped take the edge off, I had a lot going on with my son."  Work-up in the ED that she was initially afebrile but had a temperature 100.4 by 1 AM.  Her other vital signs were stable.  Labs showed an essentially normal CMP except for a glucose of 131, AST of 14, total bilirubin of 1.4.  WBC 17.5, hemoglobin 15.3, hematocrit 45.4, platelets 234,000.  Urinalysis was unremarkable.  Urine pregnancy was negative.  Drug screen was positive for amphetamines, benzodiazepines, and cocaine. CT scan obtained last evening completed at 11 PM lower lobe atelectasis.  Appendix was significantly dilated to 1.7 cm with wall thickening, appendicolith adjacent to the inflammation there is a small bubble of extraluminal air adjacent to the appendiceal tip.  Discrete fluid collection or abscess was seen.  Scattered air-fluid levels were noted in the small bowel in the right colon consistent with a mild adynamic ileus there is no small bowel or colonic wall thickening.  Was consistent with acute appendicitis.  Dr. Manus Rudd was contacted patient was transferred from [facility] to [facility] long hospital. Patient was started on Cipro and Flagyl she was also given 1 dose of Garamycin.  She will started on IV fluids.  Patient has a history of ADD HD, stroke, depression, anxiety with a son who has a history of drug abuse.  Branch retinal vein occlusion  Medications include: Adderall, Wellbutrin, cyclobenzaprine, Valium, hydrochlorothiazide, Protonix,  Detrol LA.   Past Medical History:  Diagnosis Date  . Adult ADHD    managed by Dr. Evelene Croon  . Anxiety and depression    Managed by Dr. Evelene Croon.  Marland Kitchen BRVO (branch retinal vein occlusion) 2014   R eye 2014--followed by Dr. Luciana Axe.  MRI brain 11/19/15 showed mildly advanced generalized atrophy for age, +chronic small vessel ischemic changes, no acute finding.  . Depression   . Family history of colon cancer    11/2017 colonoscopy NORM.  Recall 5 yrs (Dr. Rhea Belton)  . Postmenopausal vaginal bleeding 05/2017   Pelvic u/s 06/2017 with thickened endometrium---referred to GYN for endometrial biopsy.  . Stroke [facility]) 2014   rt.eye    Past Surgical History:  Procedure Laterality Date  . BREAST ENHANCEMENT SURGERY    . Carotid dopplers  09/19/2015   NORMAL  . chin implant    . COLONOSCOPY  11/30/2017   NORMAL: recall 5 yrs ([facility])  . FINGER SURGERY     cyst  . NECK SURGERY  REMOTE past   lymph node excision--benign per pt report  . SALIVARY GLAND SURGERY     stone   . TMJ ARTHROPLASTY      Family History  Problem Relation Age of Onset  . Hyperlipidemia Mother   . Thyroid disease Mother   . Colon polyps Mother   . Stroke Father   . Hypertension Father   . Hyperlipidemia Father   . Cancer Maternal Grandmother        colon cancer  . Healthy Sister   .  Healthy Son   . Healthy Daughter   . Colon cancer Maternal Uncle   . Breast cancer Paternal Aunt   . Lung cancer Paternal Aunt   . Bone cancer Paternal Aunt   . Colon cancer Maternal Uncle   . Esophageal cancer Neg Hx   . Rectal cancer Neg Hx   . Stomach cancer Neg Hx    Social History:  reports that she has been smoking cigarettes. She has a 0.40 pack-year smoking history. She has never used smokeless tobacco. She reports current alcohol use. She reports that she does not use drugs. She is divorced.  She works Education officer, environmental houses. Cigarettes: Less than 1 pack/day 3 years. EtOH: Social Drugs: Cocaine some weekends Allergies:  Allergies   Allergen Reactions  . Penicillins Anaphylaxis  . Sulfa Antibiotics Anaphylaxis    Medications Prior to Admission  Medication Sig Dispense Refill  . amphetamine-dextroamphetamine (ADDERALL) 20 MG tablet Take 20 mg by mouth 3 (three) times daily.    Marland Kitchen BIOTIN PO Take 1 tablet by mouth daily.    Marland Kitchen buPROPion (WELLBUTRIN SR) 150 MG 12 hr tablet Take 150 mg by mouth 2 (two) times daily.     . cyclobenzaprine (FLEXERIL) 10 MG tablet Take 1 tablet (10 mg total) by mouth 2 (two) times daily as needed for muscle spasms. 180 tablet 0  . diazepam (VALIUM) 10 MG tablet Take 30 tablets by mouth at bedtime as needed for sleep.     Marland Kitchen FLUoxetine (PROZAC) 20 MG tablet Take 60 mg by mouth daily.     . hydrochlorothiazide (MICROZIDE) 12.5 MG capsule TAKE 1 CAPSULE BY MOUTH EVERY DAY (Patient taking differently: Take 12.5 mg by mouth daily. ) 30 capsule 0  . meloxicam (MOBIC) 15 MG tablet Take 1 tablet (15 mg total) by mouth daily. 90 tablet 0  . Multiple Vitamins-Minerals (CENTRUM SILVER PO) Take 1 tablet daily by mouth.    . pantoprazole (PROTONIX) 40 MG tablet Take 1 tablet (40 mg total) by mouth daily. 30 tablet 3  . sodium chloride (OCEAN) 0.65 % SOLN nasal spray Place 2 sprays into both nostrils as needed for congestion.    Marland Kitchen tolterodine (DETROL LA) 4 MG 24 hr capsule Take 4 mg by mouth every other day.   6   . 0.9 % NaCl with KCl 20 mEq / L 100 mL/hr at 03/01/19 0226   Anti-infectives (From admission, onward)   Start     Dose/Rate Route Frequency Ordered Stop   03/01/19 0300  gentamicin (GARAMYCIN) 440 mg in dextrose 5 % 100 mL IVPB     440 mg 111 mL/hr over 60 Minutes Intravenous  Once 03/01/19 0215 03/01/19 0402   03/01/19 0015  ciprofloxacin (CIPRO) IVPB 400 mg     400 mg 200 mL/hr over 60 Minutes Intravenous  Once 03/01/19 0005 03/01/19 0117   03/01/19 0015  metroNIDAZOLE (FLAGYL) IVPB 500 mg     500 mg 100 mL/hr over 60 Minutes Intravenous  Once 03/01/19 0005 03/01/19 0129     Results for  orders placed or performed during the hospital encounter of 02/28/19 (from the past 48 hour(s))  CBC     Status: Abnormal   Collection Time: 02/28/19 10:23 PM  Result Value Ref Range   WBC 17.5 (H) 4.0 - 10.5 K/uL   RBC 4.68 3.87 - 5.11 MIL/uL   Hemoglobin 15.3 (H) 12.0 - 15.0 g/dL   HCT 06.2 69.4 - 85.4 %   MCV 97.0 80.0 - 100.0 fL  MCH 32.7 26.0 - 34.0 pg   MCHC 33.7 30.0 - 36.0 g/dL   RDW 23.5 36.1 - 44.3 %   Platelets 234 150 - 400 K/uL   nRBC 0.0 0.0 - 0.2 %    Comment: Performed at [facility] [address] [place], [place] [address]  Urinalysis, Routine w reflex microscopic     Status: Abnormal   Collection Time: 02/28/19 10:23 PM  Result Value Ref Range   Color, Urine YELLOW YELLOW   APPearance CLEAR CLEAR   Specific Gravity, Urine >1.030 (H) 1.005 - 1.030   pH 6.0 5.0 - 8.0   Glucose, UA NEGATIVE NEGATIVE mg/dL   Hgb urine dipstick TRACE (A) NEGATIVE   Bilirubin Urine NEGATIVE NEGATIVE   Ketones, ur 15 (A) NEGATIVE mg/dL   Protein, ur NEGATIVE NEGATIVE mg/dL   Nitrite NEGATIVE NEGATIVE   Leukocytes,Ua NEGATIVE NEGATIVE    Comment: Performed at [facility], [address] [place], [place] [address]  Urinalysis, Microscopic (reflex)     Status: Abnormal   Collection Time: 02/28/19 10:23 PM  Result Value Ref Range   RBC / HPF 0-5 0 - 5 RBC/hpf   WBC, UA NONE SEEN 0 - 5 WBC/hpf   Bacteria, UA RARE (A) NONE SEEN   Squamous Epithelial / LPF 0-5 0 - 5   Mucus PRESENT     Comment: Performed at [facility], [address] [place], [place] [address]  Pregnancy, urine     Status: None   Collection Time: 02/28/19 10:23 PM  Result Value Ref Range   Preg Test, Ur NEGATIVE NEGATIVE    Comment:        THE SENSITIVITY OF THIS METHODOLOGY IS >20 mIU/mL. Performed at [facility], [address] [place], [place] [address]   Rapid urine drug screen (hospital performed)     Status: Abnormal   Collection Time: 02/28/19 10:23 PM   Result Value Ref Range   Opiates NONE DETECTED NONE DETECTED   Cocaine POSITIVE (A) NONE DETECTED   Benzodiazepines POSITIVE (A) NONE DETECTED   Amphetamines POSITIVE (A) NONE DETECTED   Tetrahydrocannabinol NONE DETECTED NONE DETECTED   Barbiturates NONE DETECTED NONE DETECTED    Comment: (NOTE) DRUG SCREEN FOR MEDICAL PURPOSES ONLY.  IF CONFIRMATION IS NEEDED FOR ANY PURPOSE, NOTIFY LAB WITHIN 5 DAYS. LOWEST DETECTABLE LIMITS FOR URINE DRUG SCREEN Drug Class                     Cutoff (ng/mL) Amphetamine and metabolites    1000 Barbiturate and metabolites    200 Benzodiazepine                 200 Tricyclics and metabolites     300 Opiates and metabolites        300 Cocaine and metabolites        300 THC                            50 Performed at [facility], [address] [place], [place] [address]   Comprehensive metabolic panel     Status: Abnormal   Collection Time: 02/28/19 10:41 PM  Result Value Ref Range   Sodium 135 135 - 145 mmol/L   Potassium 3.7 3.5 - 5.1 mmol/L   Chloride 102 98 - 111 mmol/L   CO2 25 22 - 32 mmol/L  Glucose, Bld 131 (H) 70 - 99 mg/dL   BUN 11 6 - 20 mg/dL   Creatinine, Ser 1.61 0.44 - 1.00 mg/dL   Calcium 9.0 8.9 - 09.6 mg/dL   Total Protein 7.6 6.5 - 8.1 g/dL   Albumin 4.2 3.5 - 5.0 g/dL   AST 14 (L) 15 - 41 U/L   ALT 17 0 - 44 U/L   Alkaline Phosphatase 79 38 - 126 U/L   Total Bilirubin 1.4 (H) 0.3 - 1.2 mg/dL   GFR calc non Af Amer >60 >60 mL/min   GFR calc Af Amer >60 >60 mL/min   Anion gap 8 5 - 15    Comment: Performed at [facility], [address] [place], [place] [address]  Lipase, blood     Status: None   Collection Time: 02/28/19 10:41 PM  Result Value Ref Range   Lipase 18 11 - 51 U/L    Comment: Performed at [facility], [address] [place], [place] [address]  Ethanol     Status: None   Collection Time: 02/28/19 10:41 PM  Result Value Ref Range   Alcohol, Ethyl (B) <10 <10  mg/dL    Comment: (NOTE) Lowest detectable limit for serum alcohol is 10 mg/dL. For medical purposes only. Performed at [facility], [address] [place], [place] [address]   MRSA PCR Screening     Status: None   Collection Time: 03/01/19  2:13 AM  Result Value Ref Range   MRSA by PCR NEGATIVE NEGATIVE    Comment:        The GeneXpert MRSA Assay (FDA approved for NASAL specimens only), is one component of a comprehensive MRSA colonization surveillance program. It is not intended to diagnose MRSA infection nor to guide or monitor treatment for MRSA infections. Performed at [facility], [address] [place], [place] [address]    Ct Abdomen Pelvis W Contrast  Result Date: 03/01/2019 CLINICAL DATA:  rlq pain x 20 hrs, diarrhea, wbc=17.5, some confusion and slurred speech after taking  valium this afternooon EXAM: CT ABDOMEN AND PELVIS WITH CONTRAST TECHNIQUE: Multidetector CT imaging of the abdomen and pelvis was performed using the standard protocol following bolus administration of intravenous contrast. CONTRAST:  OMNIPAQUE IOHEXOL 300 MG/ML  SOLN COMPARISON:  None. FINDINGS: Lower chest: Mild dependent lower lobe atelectasis. Lung bases otherwise clear. Heart normal in size. Hepatobiliary: Liver normal in size and attenuation. 6 mm low-density lesion, segment 7, consistent with a cyst. No other liver masses or lesions. Gallbladder mildly distended. No stones. No inflammation. Common bile duct dilated to 1 cm. No evidence of a duct stone. Pancreas: Unremarkable. No pancreatic ductal dilatation or surrounding inflammatory changes. Spleen: Normal in size without focal abnormality. Adrenals/Urinary Tract: No adrenal masses. Kidneys normal size, orientation and position. No renal masses, stones or hydronephrosis. Normal ureters. Normal bladder. Stomach/Bowel: Appendix is significantly dilated, to 1.7 cm, with wall thickening, appendicoliths adjacent  inflammation. There is a small bubble of extraluminal air adjacent to the appendiceal tip. No discrete fluid collection is seen to suggest an abscess. Scattered air-fluid levels are noted in the small bowel and right colon consistent with a mild adynamic ileus. No small bowel or colonic wall thickening. Stomach is unremarkable. Vascular/Lymphatic: No significant vascular findings are present. No enlarged abdominal or pelvic lymph nodes. Reproductive: Uterus and bilateral adnexa are unremarkable. Other: Trace amount of ascites collects adjacent to the inflamed appendix and  in the posterior pelvic recess. Musculoskeletal: No fracture or acute finding. No osteoblastic or osteolytic lesions. IMPRESSION: 1. Acute appendicitis. Small bubble of extraluminal air adjacent to the appendiceal tip is consistent with rupture. No evidence of an abscess. There are prominent appendicoliths at and adjacent to the appendiceal base. Critical Value/emergent results were called by telephone at the time of interpretation on 03/01/2019 at 12:03 am to Dr. Claude Manges , who verbally acknowledged these results. 2. Scattered colonic and small bowel are fluid levels consistent with a reactive adynamic ileus. Trace amount of ascites. 3. No other acute abnormality. Electronically Signed   By: Amie Portland M.D.   On: 03/01/2019 00:05    Review of Systems  Constitutional: Positive for fever. Negative for chills, diaphoresis, malaise/fatigue and weight loss.  HENT: Negative.        She thinks she has esophageal stricture issues, but has not been seen by GI for this.  She has talked with her PCP about symptoms.  Eyes: Negative for double vision, photophobia, pain, discharge and redness.       She has had a retinal occlusion and decreased vision Right eye.    Respiratory: Negative.   Cardiovascular: Negative.   Gastrointestinal: Positive for abdominal pain, diarrhea (some early diarrhea), heartburn and nausea. Negative for blood in stool,  constipation, melena and vomiting.  Genitourinary: Negative.   Musculoskeletal: Negative.   Skin: Negative.   Neurological: Negative.   Endo/Heme/Allergies: Negative.   Psychiatric/Behavioral: Positive for depression and substance abuse [place]  last [place] was last weekend). The patient is nervous/anxious.     Blood pressure 103/70, pulse 89, temperature 100.1 F (37.8 C), temperature source Oral, resp. rate 18, height 5\' 4"  (1.626 m), weight 85.7 kg, last menstrual period 12/11/2013, SpO2 93 %. Physical Exam  Constitutional: She appears well-developed and well-nourished.  Over weight, speech is a little thick and confused at the start.  Got better as I was in the room talking with her.  HENT:  Head: Normocephalic and atraumatic.  Mouth/Throat: Oropharynx is clear and moist.  Eyes: Right eye exhibits no discharge. Left eye exhibits no discharge. No scleral icterus.  Pupils are equal,   Neck: Normal range of motion. Neck supple. No JVD present. No tracheal deviation present. No thyromegaly present.  Cardiovascular: Normal rate, regular rhythm, normal heart sounds and intact distal pulses.  No murmur heard. Respiratory: Effort normal and breath sounds normal. No respiratory distress. She has no wheezes. She has no rales. She exhibits no tenderness.  GI: Soft. She exhibits no distension and no mass. There is abdominal tenderness (RLQ and mid abdomen). There is no rebound and no guarding.  Musculoskeletal:        General: No tenderness or edema.  Lymphadenopathy:    She has no cervical adenopathy.  Neurological: She is alert. No cranial nerve deficit.  She was a little sleepy and confused when I came in but got better as the interview progressed.  Still needs help with her depression.  Son found with OD and almost died last year.    Skin: Skin is warm and dry. No rash noted. No erythema. No pallor.  Psychiatric: She has a normal mood and  affect. Her behavior is normal. Thought content normal.  She was OK considering all the drugs she has had.      Assessment/Plan Acute appendicitis with possible perforation. ADHD Hx anxiety and depression GERD/possible esophageal stricture Hx retinal branch vein occlusion  11/2015, right eye   Plan: Patient been admitted placed on IV antibiotics IV therapy.  We plan surgery later this a.m.  Risk and benefits were discussed with patient detail.  Shevy Yaney, PA-C 03/01/2019, 7:22 AM

## 2019-03-02 ENCOUNTER — Encounter (HOSPITAL_COMMUNITY): Payer: Self-pay | Admitting: Surgery

## 2019-03-02 LAB — BASIC METABOLIC PANEL
Anion gap: 6 (ref 5–15)
BUN: 11 mg/dL (ref 6–20)
CO2: 23 mmol/L (ref 22–32)
Calcium: 8 mg/dL — ABNORMAL LOW (ref 8.9–10.3)
Chloride: 112 mmol/L — ABNORMAL HIGH (ref 98–111)
Creatinine, Ser: 0.59 mg/dL (ref 0.44–1.00)
GFR calc Af Amer: >60 mL/min (ref >60)
GFR calc non Af Amer: >60 mL/min (ref >60)
Glucose, Bld: 137 mg/dL — ABNORMAL HIGH (ref 70–99)
POTASSIUM: 3.8 mmol/L (ref 3.5–5.1)
Sodium: 141 mmol/L (ref 135–145)

## 2019-03-02 LAB — CBC
HCT: 37.8 % (ref 36.0–46.0)
Hemoglobin: 13.1 g/dL (ref 12.0–15.0)
MCH: 34.4 pg — ABNORMAL HIGH (ref 26.0–34.0)
MCHC: 34.7 g/dL (ref 30.0–36.0)
MCV: 99.2 fL (ref 80.0–100.0)
Platelets: 199 10*3/uL (ref 150–400)
RBC: 3.81 MIL/uL — ABNORMAL LOW (ref 3.87–5.11)
RDW: 13 % (ref 11.5–15.5)
WBC: 14 10*3/uL — ABNORMAL HIGH (ref 4.0–10.5)
nRBC: 0 % (ref 0.0–0.2)

## 2019-03-02 NOTE — Progress Notes (Addendum)
1 Day Post-Op    HU:[id number] pain  Subjective: Pt feels better this AM, routine soreness post op.  Drain is a cloudy serous fluid.  She has not had much in the way of clears.    Objective: Vital signs in last 24 hours: Temp:  [97.7 F (36.5 C)-99.2 F (37.3 C)] 97.7 F (36.5 C) (03/20 0938) Pulse Rate:  [69-100] 69 (03/20 0938) Resp:  [14-22] 16 (03/20 0938) BP: (89-133)/(56-87) 89/56 (03/20 0938) SpO2:  [90 %-99 %] 90 % (03/20 0938) Last BM Date: 02/28/19 To 10 p.o. 3700 IV 1925 urine 400 from the drain Afebrile vital signs are stable WBC slightly improved  17.5>>14.0 BMP okay   Intake/Output from previous day: 03/19 0701 - 03/20 0700 In: 3935.7 [P.O.:210; I.V.:3025.8; IV Piggyback:700] Out: 2325 [Urine:1925; Drains:400] Intake/Output this shift: Total I/O In: 120 [P.O.:120] Out: -   General appearance: alert, cooperative and no distress Resp: clear to auscultation bilaterally GI: soft, still sore, sites OK.  Drain mostly cloudy serous fluid, minimal blood in the fluid..   Lab Results:  Recent Labs    02/28/19 2223 03/02/19 0422  WBC 17.5* 14.0*  HGB 15.3* 13.1  HCT 45.4 37.8  PLT 234 199    BMET Recent Labs    02/28/19 2241 03/02/19 0422  NA 135 141  K 3.7 3.8  CL 102 112*  CO2 25 23  GLUCOSE 131* 137*  BUN 11 11  CREATININE 0.71 0.59  CALCIUM 9.0 8.0*   PT/INR No results for input(s): LABPROT, INR in the last 72 hours.  Recent Labs  Lab 02/28/19 2241  AST 14*  ALT 17  ALKPHOS 79  BILITOT 1.4*  PROT 7.6  ALBUMIN 4.2     Lipase     Component Value Date/Time   LIPASE 18 02/28/2019 2241     Medications: . acetaminophen  1,000 mg Oral Q6H  . enoxaparin (LOVENOX) injection  40 mg Subcutaneous Q24H  . sodium chloride flush  3 mL Intravenous Once   . 0.9 % NaCl with KCl 20 mEq / L 100 mL/hr at 03/02/19 0600  . ciprofloxacin 400 mg (03/02/19 0931)  . metronidazole Stopped (03/02/19 0349)   Anti-infectives (From admission,  onward)   Start     Dose/Rate Route Frequency Ordered Stop   03/01/19 2200  ciprofloxacin (CIPRO) IVPB 400 mg     400 mg 200 mL/hr over 60 Minutes Intravenous Every 12 hours 03/01/19 1520     03/01/19 1800  metroNIDAZOLE (FLAGYL) IVPB 500 mg     500 mg 100 mL/hr over 60 Minutes Intravenous Every 8 hours 03/01/19 1520     03/01/19 1200  ciprofloxacin (CIPRO) IVPB 400 mg  Status:  Discontinued     400 mg 200 mL/hr over 60 Minutes Intravenous Every 12 hours 03/01/19 0834 03/01/19 1520   03/01/19 0830  metroNIDAZOLE (FLAGYL) IVPB 500 mg  Status:  Discontinued    Note to Pharmacy:  Pt started at [facility], continue till surgery   500 mg 100 mL/hr over 60 Minutes Intravenous Every 8 hours 03/01/19 0822 03/01/19 1520   03/01/19 0300  gentamicin (GARAMYCIN) 440 mg in dextrose 5 % 100 mL IVPB     440 mg 111 mL/hr over 60 Minutes Intravenous  Once 03/01/19 0215 03/01/19 0402   03/01/19 0015  ciprofloxacin (CIPRO) IVPB 400 mg     400 mg 200 mL/hr over 60 Minutes Intravenous  Once 03/01/19 0005 03/01/19 0117   03/01/19 0015  metroNIDAZOLE (FLAGYL) IVPB 500 mg  500 mg 100 mL/hr over 60 Minutes Intravenous  Once 03/01/19 0005 03/01/19 0129     Assessment/Plan ADHD Hx anxiety and depression GERD/possible esophageal strictures Hx stroke/retinal branch vein occlusion 11/2015 right eye  Acute appendicitis with gangrenous base Laparoscopic appendectomy with drain placement 03/01/2019 Dr. Darnell Level  - drain at staple line, keep drain till she is ready to go home - watch for staple line issue  - go slow with clears  FEN: IV fluids/clear liquids ID: Cipro/Flagyl 3/19>> Day 2  DVT: Lovenox Follow-up: [facility]  Plan:  Continue antibiotics, drain and clear liquids.  Discharge follow up is already in the AVS.      LOS: 1 day    Haley Martin 03/02/2019 [phone]

## 2019-03-03 NOTE — Plan of Care (Signed)
  Problem: Health Behavior/Discharge Planning: Goal: Ability to manage health-related needs will improve Outcome: Progressing   Problem: Clinical Measurements: Goal: Ability to maintain clinical measurements within normal limits will improve Outcome: Progressing Goal: Will remain free from infection Outcome: Progressing Goal: Diagnostic test results will improve Outcome: Progressing Goal: Respiratory complications will improve Outcome: Progressing Goal: Cardiovascular complication will be avoided Outcome: Progressing   Problem: Activity: Goal: Risk for activity intolerance will decrease Outcome: Progressing   Problem: Nutrition: Goal: Adequate nutrition will be maintained Outcome: Progressing   Problem: Coping: Goal: Level of anxiety will decrease Outcome: Progressing   Problem: Pain Managment: Goal: General experience of comfort will improve Outcome: Progressing   Problem: Safety: Goal: Ability to remain free from injury will improve Outcome: Progressing   Problem: Skin Integrity: Goal: Risk for impaired skin integrity will decrease Outcome: Progressing   

## 2019-03-03 NOTE — Progress Notes (Signed)
2 Days Post-Op  Subjective: Alert.  Stable.  Afebrile.  Heart rate 72 Pain less.  No vomiting.  Tolerating clear liquids Slow to ambulate Had a bowel movement Drainage low volume  Objective: Vital signs in last 24 hours: Temp:  [97.5 F (36.4 C)-98.1 F (36.7 C)] 97.5 F (36.4 C) (03/21 0618) Pulse Rate:  [66-72] 72 (03/21 0618) Resp:  [16] 16 (03/21 0618) BP: (89-111)/(56-76) 111/76 (03/21 0618) SpO2:  [90 %-96 %] 93 % (03/21 0618) Last BM Date: 02/28/19  Intake/Output from previous day: 03/20 0701 - 03/21 0700 In: 3711.8 [P.O.:720; I.V.:2291.8; IV Piggyback:700] Out: 2710 [Urine:2650; Drains:60] Intake/Output this shift: No intake/output data recorded.   General appearance: alert, cooperative and no distress Resp: clear to auscultation bilaterally GI: soft, still sore, sites OK.  Drain mostly cloudy serous fluid, minimal blood in the fluid.. 30 cc out   Lab Results:  No results found for this or any previous visit (from the past 24 hour(s)).   Studies/Results: No results found.  Marland Kitchen acetaminophen  1,000 mg Oral Q6H  . enoxaparin (LOVENOX) injection  40 mg Subcutaneous Q24H  . sodium chloride flush  3 mL Intravenous Once     Assessment/Plan: s/p Procedure(s): APPENDECTOMY LAPAROSCOPIC WITH DRAIN PLACEMENT   Acute appendicitis with gangrenous base.  Stapled at cecum POD #2 laparoscopic appendectomy with drain placement.  Dr. Gerrit Friends -Advance to full liquid -encourage ambulation -keep drain in until she is tolerating regular diet, having bowel movements and  ready to go home -Plan IV antibiotics for 2 or 3 days and then oral antibiotics for a week after discharge  ADHD Hx anxiety and depression GERD/possible esophageal strictures Hx stroke/retinal branch vein occlusion 11/2015 right eye   @PROBHOSP @  LOS: 2 days    Ernestene Mention 03/03/2019  . .prob

## 2019-03-03 NOTE — Plan of Care (Signed)
  Problem: Pain Managment: Goal: General experience of comfort will improve Outcome: Progressing   Problem: Skin Integrity: Goal: Risk for impaired skin integrity will decrease Outcome: Progressing   Problem: Coping: Goal: Level of anxiety will decrease Outcome: Progressing   Problem: Nutrition: Goal: Adequate nutrition will be maintained Outcome: Progressing   Problem: Activity: Goal: Risk for activity intolerance will decrease Outcome: Progressing

## 2019-03-03 NOTE — Progress Notes (Signed)
Patient refusing to ambulate in the hallway despite teaching. Encouraged a lot, pt refused.Will attempt again.

## 2019-03-04 LAB — GLUCOSE, CAPILLARY: Glucose-Capillary: 123 mg/dL — ABNORMAL HIGH (ref 70–99)

## 2019-03-04 MED ORDER — KETOROLAC TROMETHAMINE 15 MG/ML IJ SOLN
15.0000 mg | Freq: Three times a day (TID) | INTRAMUSCULAR | Status: DC
  Administered 2019-03-04 (×2): 15 mg via INTRAVENOUS
  Filled 2019-03-04 (×3): qty 1

## 2019-03-04 MED ORDER — ENSURE ENLIVE PO LIQD
237.0000 mL | Freq: Two times a day (BID) | ORAL | Status: DC
  Administered 2019-03-04 – 2019-03-05 (×2): 237 mL via ORAL

## 2019-03-04 NOTE — Progress Notes (Signed)
Progress Note: General Surgery Service   Assessment/Plan: Active Problems:   Acute appendicitis with rupture  s/p Procedure(s): APPENDECTOMY LAPAROSCOPIC WITH DRAIN PLACEMENT 03/01/2019 Acute appendicitis with gangrenous base.  Stapled at cecum POD #3 laparoscopic appendectomy with drain placement.  Dr. Gerrit Friends -continue full liquid diet -encourage ambulation -keep drain in until she is tolerating regular diet, having bowel movements and  ready to go home -Plan IV antibiotics for 2 or 3 days and then oral antibiotics for a week after discharge -add toradol for pain  ADHD Hx anxiety and depression GERD/possible esophageal strictures Hx stroke/retinal branch vein occlusion 11/2015 right eye    LOS: 3 days  Chief Complaint/Subjective: Pain worse this morning in RLQ, tolerating liquids, no appetite  Objective: Vital signs in last 24 hours: Temp:  [98 F (36.7 C)-98.2 F (36.8 C)] 98 F (36.7 C) (03/22 0631) Pulse Rate:  [71-75] 75 (03/22 0631) Resp:  [16] 16 (03/22 0631) BP: (95-105)/(70-72) 105/70 (03/22 0631) SpO2:  [97 %-98 %] 98 % (03/22 0631) Last BM Date: 02/28/19  Intake/Output from previous day: 03/21 0701 - 03/22 0700 In: 2150.3 [P.O.:720; I.V.:1030.3; IV Piggyback:400] Out: 1101 [Urine:1100; Stool:1] Intake/Output this shift: Total I/O In: -  Out: 30 [Drains:30]  Lungs: nonlabored  Cardiovascular: RRR  Abd: soft, TTP RLQ, incision c/d/i, drain with thin output  Extremities: no edema  Neuro: anxious, tearful, AOx4  Lab Results: CBC  Recent Labs    03/02/19 0422  WBC 14.0*  HGB 13.1  HCT 37.8  PLT 199   BMET Recent Labs    03/02/19 0422  NA 141  K 3.8  CL 112*  CO2 23  GLUCOSE 137*  BUN 11  CREATININE 0.59  CALCIUM 8.0*   PT/INR No results for input(s): LABPROT, INR in the last 72 hours. ABG No results for input(s): PHART, HCO3 in the last 72 hours.  Invalid input(s): PCO2, PO2  Studies/Results:  Anti-infectives:  Anti-infectives (From admission, onward)   Start     Dose/Rate Route Frequency Ordered Stop   03/01/19 2200  ciprofloxacin (CIPRO) IVPB 400 mg     400 mg 200 mL/hr over 60 Minutes Intravenous Every 12 hours 03/01/19 1520     03/01/19 1800  metroNIDAZOLE (FLAGYL) IVPB 500 mg     500 mg 100 mL/hr over 60 Minutes Intravenous Every 8 hours 03/01/19 1520     03/01/19 1200  ciprofloxacin (CIPRO) IVPB 400 mg  Status:  Discontinued     400 mg 200 mL/hr over 60 Minutes Intravenous Every 12 hours 03/01/19 0834 03/01/19 1520   03/01/19 0830  metroNIDAZOLE (FLAGYL) IVPB 500 mg  Status:  Discontinued    Note to Pharmacy:  Pt started at [facility], continue till surgery   500 mg 100 mL/hr over 60 Minutes Intravenous Every 8 hours 03/01/19 0822 03/01/19 1520   03/01/19 0300  gentamicin (GARAMYCIN) 440 mg in dextrose 5 % 100 mL IVPB     440 mg 111 mL/hr over 60 Minutes Intravenous  Once 03/01/19 0215 03/01/19 0402   03/01/19 0015  ciprofloxacin (CIPRO) IVPB 400 mg     400 mg 200 mL/hr over 60 Minutes Intravenous  Once 03/01/19 0005 03/01/19 0117   03/01/19 0015  metroNIDAZOLE (FLAGYL) IVPB 500 mg     500 mg 100 mL/hr over 60 Minutes Intravenous  Once 03/01/19 0005 03/01/19 0129      Medications: Scheduled Meds: . acetaminophen  1,000 mg Oral Q6H  . enoxaparin (LOVENOX) injection  40 mg Subcutaneous Q24H  . ketorolac  15 mg Intravenous Q8H  . sodium chloride flush  3 mL Intravenous Once   Continuous Infusions: . 0.9 % NaCl with KCl 20 mEq / L 100 mL/hr at 03/04/19 1002  . ciprofloxacin 400 mg (03/04/19 0848)  . metronidazole 500 mg (03/04/19 1002)   PRN Meds:.diphenhydrAMINE **OR** diphenhydrAMINE, morphine injection, ondansetron **OR** ondansetron (ZOFRAN) IV, oxyCODONE  Rodman Pickle, MD [facility]

## 2019-03-04 NOTE — Plan of Care (Signed)
  Problem: Health Behavior/Discharge Planning: Goal: Ability to manage health-related needs will improve Outcome: Progressing   Problem: Clinical Measurements: Goal: Ability to maintain clinical measurements within normal limits will improve Outcome: Progressing Goal: Will remain free from infection Outcome: Progressing Goal: Diagnostic test results will improve Outcome: Progressing Goal: Respiratory complications will improve Outcome: Progressing Goal: Cardiovascular complication will be avoided Outcome: Progressing   Problem: Activity: Goal: Risk for activity intolerance will decrease Outcome: Progressing   Problem: Nutrition: Goal: Adequate nutrition will be maintained Outcome: Progressing   Problem: Coping: Goal: Level of anxiety will decrease Outcome: Progressing   Problem: Pain Managment: Goal: General experience of comfort will improve Outcome: Progressing   Problem: Safety: Goal: Ability to remain free from injury will improve Outcome: Progressing   Problem: Skin Integrity: Goal: Risk for impaired skin integrity will decrease Outcome: Progressing  Pt alert and oriented, resting this am. Pain well controlled with PO pain meds.  IS and ambulation encouraged.  Pt needs reinforcement, RN will monitor.

## 2019-03-04 NOTE — Progress Notes (Signed)
During rounds, pt was back in bed. RN encouraged pt to ambulate in hall. Pt became irritated at the suggestion, stating dr had told her to rest today.  RN agreed that pt would be able to rest, but ambulation is also necessary for postop recovery. Pt asked to speak to charge nurse, whom also agreed with RN that encouraging ambulation was necessary.  Pt finally agreed to walk a lap in the hall.  RN will monitor.

## 2019-03-05 LAB — CBC
HCT: 40.3 % (ref 36.0–46.0)
HEMOGLOBIN: 13.4 g/dL (ref 12.0–15.0)
MCH: 32.8 pg (ref 26.0–34.0)
MCHC: 33.3 g/dL (ref 30.0–36.0)
MCV: 98.5 fL (ref 80.0–100.0)
Platelets: 246 10*3/uL (ref 150–400)
RBC: 4.09 MIL/uL (ref 3.87–5.11)
RDW: 13.4 % (ref 11.5–15.5)
WBC: 12.6 10*3/uL — ABNORMAL HIGH (ref 4.0–10.5)
nRBC: 0 % (ref 0.0–0.2)

## 2019-03-05 MED ORDER — CIPROFLOXACIN HCL 500 MG PO TABS: 500.0000 mg | ORAL_TABLET | Freq: Two times a day (BID) | ORAL | 0 refills | Status: AC

## 2019-03-05 MED ORDER — PANTOPRAZOLE SODIUM 40 MG PO TBEC
40.0000 mg | DELAYED_RELEASE_TABLET | Freq: Every day | ORAL | Status: DC
  Administered 2019-03-05: 40 mg via ORAL
  Filled 2019-03-05: qty 1

## 2019-03-05 MED ORDER — FESOTERODINE FUMARATE ER 4 MG PO TB24
4.0000 mg | ORAL_TABLET | Freq: Every day | ORAL | Status: DC
  Administered 2019-03-05: 4 mg via ORAL
  Filled 2019-03-05: qty 1

## 2019-03-05 MED ORDER — FLUOXETINE HCL 20 MG PO TABS
60.0000 mg | ORAL_TABLET | Freq: Every day | ORAL | Status: DC
  Filled 2019-03-05: qty 3

## 2019-03-05 MED ORDER — BUPROPION HCL ER (SR) 150 MG PO TB12
150.0000 mg | ORAL_TABLET | Freq: Two times a day (BID) | ORAL | Status: DC
  Administered 2019-03-05: 150 mg via ORAL
  Filled 2019-03-05: qty 1

## 2019-03-05 MED ORDER — METRONIDAZOLE 500 MG PO TABS
500.0000 mg | ORAL_TABLET | Freq: Three times a day (TID) | ORAL | Status: DC
  Administered 2019-03-05: 500 mg via ORAL
  Filled 2019-03-05: qty 1

## 2019-03-05 MED ORDER — OXYCODONE HCL 5 MG PO TABS: 5.0000 mg | ORAL_TABLET | ORAL | 0 refills | Status: DC | PRN

## 2019-03-05 MED ORDER — CIPROFLOXACIN HCL 500 MG PO TABS
500.0000 mg | ORAL_TABLET | Freq: Two times a day (BID) | ORAL | Status: DC
  Administered 2019-03-05: 500 mg via ORAL
  Filled 2019-03-05: qty 1

## 2019-03-05 MED ORDER — METRONIDAZOLE 500 MG PO TABS: 500.0000 mg | ORAL_TABLET | Freq: Three times a day (TID) | ORAL | 0 refills | Status: AC

## 2019-03-05 MED ORDER — AMPHETAMINE-DEXTROAMPHETAMINE 10 MG PO TABS
20.0000 mg | ORAL_TABLET | ORAL | Status: DC
  Administered 2019-03-05: 20 mg via ORAL
  Filled 2019-03-05: qty 2

## 2019-03-05 MED ORDER — MELOXICAM 15 MG PO TABS
15.0000 mg | ORAL_TABLET | Freq: Every day | ORAL | Status: DC
  Administered 2019-03-05: 15 mg via ORAL
  Filled 2019-03-05: qty 1

## 2019-03-05 MED ORDER — SACCHAROMYCES BOULARDII 250 MG PO CAPS: ORAL_CAPSULE | ORAL | Status: DC

## 2019-03-05 MED ORDER — SALINE SPRAY 0.65 % NA SOLN
2.0000 | NASAL | Status: DC | PRN
  Filled 2019-03-05: qty 44

## 2019-03-05 MED ORDER — SACCHAROMYCES BOULARDII 250 MG PO CAPS
250.0000 mg | ORAL_CAPSULE | Freq: Two times a day (BID) | ORAL | Status: DC
  Administered 2019-03-05: 250 mg via ORAL
  Filled 2019-03-05: qty 1

## 2019-03-05 MED ORDER — FLUOXETINE HCL 20 MG PO CAPS
60.0000 mg | ORAL_CAPSULE | Freq: Every day | ORAL | Status: DC
  Administered 2019-03-05: 60 mg via ORAL
  Filled 2019-03-05: qty 3

## 2019-03-05 MED ORDER — ACETAMINOPHEN 500 MG PO TABS: ORAL_TABLET | ORAL | 0 refills | Status: DC

## 2019-03-05 NOTE — Progress Notes (Signed)
Discharge instructions given, pt has all belongings, no questions, wheeled to front and in car with no complications.

## 2019-03-05 NOTE — Discharge Summary (Signed)
[facility] Discharge Summary   Patient ID: Haley Martin MRN: [id number] DOB/AGE: [birth year] 55 y.o.  Admit date: 02/28/2019 Discharge date: 03/05/2019  Admitting Diagnosis: Acute appendicitis   Discharge Diagnosis Perforated acute appendicitis  Consultants None   Imaging: No results found.  Procedures Dr. Gerrit Friends (03/01/19) - Laparoscopic Appendectomy  Hospital Course:  Patient is a 53 year old female who presented to [facility] with abdominal pain.  Workup showed acute appendicitis.  Patient was admitted and underwent procedure listed above. Tolerated procedure well and was transferred to the floor.  Diet was advanced as tolerated.  On POD#4, the patient was voiding well, tolerating diet, ambulating well, pain well controlled, vital signs stable, incisions c/d/i and felt stable for discharge home.  Patient will follow up in our office in 1-2 weeks and knows to call with questions or concerns. She will call to confirm appointment date/time.    Allergies as of 03/05/2019      Reactions   Penicillins Anaphylaxis   Sulfa Antibiotics Anaphylaxis      Medication List    TAKE these medications   acetaminophen 500 MG tablet Commonly known as:  TYLENOL Take 1000 mg every 6 hours as needed for pain.  You can also use your meloxicam daily as previously prescribed. Do not exceed 4000 mg of Tylenol/acetaminophen per day it can harm your liver.  If the Tylenol and meloxicam do not control your pain you can use the oxycodone as prescribed.  We will not refill the oxycodone.   amphetamine-dextroamphetamine 20 MG tablet Commonly known as:  ADDERALL Take 20 mg by mouth 3 (three) times daily.   BIOTIN PO Take 1 tablet by mouth daily.   buPROPion 150 MG 12 hr tablet Commonly known as:  WELLBUTRIN SR Take 150 mg by mouth 2 (two) times daily.   CENTRUM SILVER PO Take 1 tablet daily by mouth.   ciprofloxacin 500 MG tablet Commonly known as:  CIPRO Take 1 tablet (500 mg total)  by mouth 2 (two) times daily for 10 days.   cyclobenzaprine 10 MG tablet Commonly known as:  FLEXERIL Take 1 tablet (10 mg total) by mouth 2 (two) times daily as needed for muscle spasms.   diazepam 10 MG tablet Commonly known as:  VALIUM Take 30 tablets by mouth at bedtime as needed for sleep.   FLUoxetine 20 MG tablet Commonly known as:  PROZAC Take 60 mg by mouth daily.   hydrochlorothiazide 12.5 MG capsule Commonly known as:  MICROZIDE TAKE 1 CAPSULE BY MOUTH EVERY DAY What changed:  how much to take   meloxicam 15 MG tablet Commonly known as:  MOBIC Take 1 tablet (15 mg total) by mouth daily.   metroNIDAZOLE 500 MG tablet Commonly known as:  FLAGYL Take 1 tablet (500 mg total) by mouth every 8 (eight) hours for 10 days.   oxyCODONE 5 MG immediate release tablet Commonly known as:  Oxy IR/ROXICODONE Take 1-2 tablets (5-10 mg total) by mouth every 4 (four) hours as needed for severe pain.   pantoprazole 40 MG tablet Commonly known as:  PROTONIX Take 1 tablet (40 mg total) by mouth daily.   saccharomyces boulardii 250 MG capsule Commonly known as:  FLORASTOR This is a probiotic to help replace some healthy bacteria to your colon.  You can buy this over-the-counter at any drugstore.  Follow the package directions and use this for the next 28 days.  You do not need a prescription for this.   sodium chloride 0.65 %  Soln nasal spray Commonly known as:  OCEAN Place 2 sprays into both nostrils as needed for congestion.   tolterodine 4 MG 24 hr capsule Commonly known as:  DETROL LA Take 4 mg by mouth every other day.        Follow-up Information    [facility] Follow up on 03/15/2019.   Specialty:  General Surgery Why:  your appointment is at 4:00PM.  Be at the office 30 minutes early for check in.  Bring photo ID and insurance information.  They will also pull your drain at this time. Contact information: [address] [place]  [address] [phone]           Signed: Wells Guiles, [facility] 03/05/2019, 1:58 PM Pager: [phone] Consults: [phone]

## 2019-03-05 NOTE — Discharge Instructions (Signed)
CCS ______[facility] LAPAROSCOPIC SURGERY: POST OP INSTRUCTIONS. Call if the drainage turns colors, or if you have fever, increased pain, trouble voiding, nausea or vomiting.  Just feel bad and are not sure why.  Always review your discharge instruction sheet given to you by the facility where your surgery was performed. IF YOU HAVE DISABILITY OR FAMILY LEAVE FORMS, YOU MUST BRING THEM TO THE OFFICE FOR PROCESSING.   DO NOT GIVE THEM TO YOUR DOCTOR.  1. A prescription for pain medication may be given to you upon discharge.  Take your pain medication as prescribed, if needed.  If narcotic pain medicine is not needed, then you may take acetaminophen (Tylenol) or ibuprofen (Advil) as needed. 2. Take your usually prescribed medications unless otherwise directed. 3. If you need a refill on your pain medication, please contact your pharmacy.  They will contact our office to request authorization. Prescriptions will not be filled after 5pm or on week-ends. 4. You should follow a light diet the first few days after arrival home, such as soup and crackers, etc.  Be sure to include lots of fluids daily. 5. Most patients will experience some swelling and bruising in the area of the incisions.  Ice packs will help.  Swelling and bruising can take several days to resolve.  6. It is common to experience some constipation if taking pain medication after surgery.  Increasing fluid intake and taking a stool softener (such as Colace) will usually help or prevent this problem from occurring.  A mild laxative (Milk of Magnesia or Miralax) should be taken according to package instructions if there are no bowel movements after 48 hours. 7. Unless discharge instructions indicate otherwise, you may remove your bandages 24-48 hours after surgery, and you may shower at that time.  You may have steri-strips (small skin tapes) in place directly over the incision.  These strips should be left on the skin for  7-10 days.  If your surgeon used skin glue on the incision, you may shower in 24 hours.  The glue will flake off over the next 2-3 weeks.  Any sutures or staples will be removed at the office during your follow-up visit. 8. ACTIVITIES:  You may resume regular (light) daily activities beginning the next day--such as daily self-care, walking, climbing stairs--gradually increasing activities as tolerated.  You may have sexual intercourse when it is comfortable.  Refrain from any heavy lifting or straining until approved by your doctor. a. You may drive when you are no longer taking prescription pain medication, you can comfortably wear a seatbelt, and you can safely maneuver your car and apply brakes. b. RETURN TO WORK:  __________________________________________________________ 9. You should see your doctor in the office for a follow-up appointment approximately 2-3 weeks after your surgery.  Make sure that you call for this appointment within a day or two after you arrive home to insure a convenient appointment time. 10. OTHER INSTRUCTIONS: __________________________________________________________________________________________________________________________ __________________________________________________________________________________________________________________________ WHEN TO CALL YOUR DOCTOR: 1. Fever over 101.0 2. Inability to urinate 3. Continued bleeding from incision. 4. Increased pain, redness, or drainage from the incision. 5. Increasing abdominal pain  The clinic staff is available to answer your questions during regular business hours.  Please dont hesitate to call and ask to speak to one of the nurses for clinical concerns.  If you have a medical emergency, go to the nearest emergency room or call 911.  A surgeon from [facility] is always on call at the hospital. [address], [address], [place], [place]  [address] ? [address] [address], [place], [place]    [address] [phone] ? [phone] ? FAX [phone] Web site: [email or web address]   [facility] Call if the drainage turns colors, or if you have fever, increased pain, trouble voiding, nausea or vomiting.  Just feel bad and are not sure why Surgical drains are used to remove extra fluid that normally builds up in a surgical wound after surgery. A surgical drain helps to heal a surgical wound. Different kinds of surgical drains include:  Active drains. These drains use suction to pull drainage away from the surgical wound. Drainage flows through a tube to a container outside of the body. It is important to keep the bulb or the drainage container flat (compressed) at all times, except while you empty it. Flattening the bulb or container creates suction. The two most common types of active drains are bulb drains and Hemovac drains.  Passive drains. These drains allow fluid to drain naturally, by gravity. Drainage flows through a tube to a bandage (dressing) or a container outside of the body. Passive drains do not need to be emptied. The most common type of passive drain is the Penrose drain. A drain is placed during surgery. Immediately after surgery, drainage is usually bright red and a little thicker than water. The drainage may gradually turn yellow or pink and become thinner. It is likely that your health care provider will remove the drain when the drainage stops or when the amount decreases to 1-2 Tbsp (15-30 mL) during a 24-hour period. How to care for your surgical drain It is important to care for your drain to prevent infection. If your drain is placed at your back, or any other hard-to-reach area, ask another person to assist you in performing the following steps:  Keep the skin around the drain dry and covered with a dressing at all times.  Check your drain area every day for signs of infection. Check for: ? More redness, swelling, or pain. ? Pus or a bad  smell. ? Cloudy drainage. Follow instructions from your health care provider about how to take care of your drain and how to change your dressing. Change your dressing at least one time every day. Change it more often if needed to keep the dressing dry. Make sure you: 1. Gather your supplies, including: ? Tape. ? Germ-free cleaning solution (sterile saline). ? Split gauze drain sponge: 4 x 4 inches (10 x 10 cm). ? Gauze square: 4 x 4 inches (10 x 10 cm). 2. Wash your hands with soap and water before you change your dressing. If soap and water are not available, use hand sanitizer. 3. Remove the old dressing. Avoid using scissors to do that. 4. Use sterile saline to clean your skin around the drain. 5. Place the tube through the slit in a drain sponge. Place the drain sponge so that it covers your wound. 6. Place the gauze square or another drain sponge on top of the drain sponge that is on the wound. Make sure the tube is between those layers. 7. Tape the dressing to your skin. 8. If you have an active bulb or Hemovac drain, tape the drainage tube to your skin 1-2 inches (2.5-5 cm) below the place where the tube enters your body. Taping keeps the tube from pulling on any stitches (sutures) that you have. 9. Wash your hands with soap and water. 10. Write down the color of your  drainage and how often you change your dressing. How to empty your active bulb or Hemovac drain  1. Make sure that you have a measuring cup that you can empty your drainage into. 2. Wash your hands with soap and water. If soap and water are not available, use hand sanitizer. 3. Gently move your fingers down the tube while squeezing very lightly. This is called stripping the tube. This clears any drainage, clots, or tissue from the tube. ? Do not pull on the tube. ? You may need to strip the tube several times every day to keep the tube clear. 4. Open the bulb cap or the drain plug. Do not touch the inside of the cap or  the bottom of the plug. 5. Empty all of the drainage into the measuring cup. 6. Compress the bulb or the container and replace the cap or the plug. To compress the bulb or the container, squeeze it firmly in the middle while you close the cap or plug the container. 7. Write down the amount of drainage that you have in each 24-hour period. If you have less than 2 Tbsp (30 mL) of drainage during 24 hours, contact your health care provider. 8. Flush the drainage down the toilet. 9. Wash your hands with soap and water. Contact a health care provider if:  You have more redness, swelling, or pain around your drain area.  The amount of drainage that you have is increasing instead of decreasing.  You have pus or a bad smell coming from your drain area.  You have a fever.  You have drainage that is cloudy.  There is a sudden stop or a sudden decrease in the amount of drainage that you have.  Your tube falls out.  Your active draindoes not stay compressedafter you empty it. Summary  Surgical drains are used to remove extra fluid that normally builds up in a surgical wound after surgery.  Different kinds of surgical drains include active drains and passive drains. Active drains use suction to pull drainage away from the surgical wound, and passive drains allow fluid to drain naturally.  It is important to care for your drain to prevent infection. If your drain is placed at your back, or any other hard-to-reach area, ask another person to assist you.  Contact your health care provider if you have more redness, swelling, or pain around your drain area. This information is not intended to replace advice given to you by your health care provider. Make sure you discuss any questions you have with your health care provider. Document Released: 11/26/2000 Document Revised: 12/22/2017 Document Reviewed: 06/18/2015 Elsevier Interactive Patient Education  2019 [employer].

## 2019-03-05 NOTE — TOC Transition Note (Signed)
Transition of Care [facility]) - CM/SW Discharge Note   Patient Details  Name: Haley Martin MRN: [id number] Date of Birth: Jul 27, 1966  [facility] CM/SW Contact:  Golda Acre, RN Phone Number: 03/05/2019, 2:06 PM   Clinical Narrative:    Discharged to home with self-care, orders checked for hhc needs. No TOC needs present at time of discharge.  Patient is able to arrangement own appointments and home care.   Final next level of care: Home/Self Care Barriers to Discharge: No Barriers Identified   Patient Goals and CMS Choice Patient states their goals for this hospitalization and ongoing recovery are:: to go home and heal      Discharge Placement                       Discharge Plan and Services                          Social Determinants of Health (SDOH) Interventions     Readmission Risk Interventions No flowsheet data found.

## 2019-03-05 NOTE — Progress Notes (Signed)
4 Days Post-Op    CC: Abdominal pain Subjective: Patient is doing better this a.m.  Not complaining of nausea but is still very tender.  She is thinks the NSAID drugs worked the best for her.  She is on meloxicam at home.  I told her she could have both the meloxicam and the other NSAID.  Port sites all look good.  Drain is clear serous fluid.  Objective: Vital signs in last 24 hours: Temp:  [97.7 F (36.5 C)-99.4 F (37.4 C)] 97.7 F (36.5 C) (03/23 0535) Pulse Rate:  [86-88] 88 (03/23 0535) Resp:  [16] 16 (03/23 0535) BP: (105-121)/(73-83) 121/83 (03/23 0535) SpO2:  [96 %-98 %] 98 % (03/23 0535) Last BM Date: 03/03/19 720 p.o. -full liquids 3208 IV 1720 urine Drain 30 Stool x4 Pain Tylenol 1 g x 3 Toradol x2 Oxycodone x2 (total 15 mg) Afebrile vital signs are stable WBC trending down  17.5>>14.0>>12.6 (3/23) Appendix, Other than Incidental - ACUTE AND SUPPURATIVE APPENDICITIS AND SEROSITIS WITH TRANSMURAL DEFECT. - THERE IS NO EVIDENCE OF MALIGNANCY. Intake/Output from previous day: 03/22 0701 - 03/23 0700 In: 4478.3 [P.O.:720; I.V.:3208.3; IV Piggyback:550] Out: 1750 [Urine:1720; Drains:30] Intake/Output this shift: No intake/output data recorded.  General appearance: alert, cooperative and no distress Resp: clear to auscultation bilaterally GI: Soft, tender and sore.  Positive bowel sounds.  Drain is clear serosanguineous.  Lab Results:  Recent Labs    03/05/19 0422  WBC 12.6*  HGB 13.4  HCT 40.3  PLT 246    BMET No results for input(s): NA, K, CL, CO2, GLUCOSE, BUN, CREATININE, CALCIUM in the last 72 hours. PT/INR No results for input(s): LABPROT, INR in the last 72 hours.  Recent Labs  Lab 02/28/19 2241  AST 14*  ALT 17  ALKPHOS 79  BILITOT 1.4*  PROT 7.6  ALBUMIN 4.2     Lipase     Component Value Date/Time   LIPASE 18 02/28/2019 2241     Medications: . acetaminophen  1,000 mg Oral Q6H  . enoxaparin (LOVENOX) injection  40 mg  Subcutaneous Q24H  . feeding supplement (ENSURE ENLIVE)  237 mL Oral BID BM  . ketorolac  15 mg Intravenous Q8H  . sodium chloride flush  3 mL Intravenous Once    Assessment/Plan ADHD Hx anxiety and depression GERD/possible esophageal strictures Hx stroke/retinal branch vein occlusion 11/2015 right eye  Acute appendicitis with gangrenous base Laparoscopic appendectomy with drain placement 03/01/2019 Dr. Darnell Level  - drain at staple line, keep drain till she is ready to go home - watch for staple line issue  - go slow with clears  FEN: IV fluids/clear liquids ID: Cipro/Flagyl 3/19>> Day 5 DVT: Lovenox Follow-up: [facility]   Plan: Advance diet, get her back on most of her home medicines.  Change antibiotics to PO and recheck labs in AM.  She is going home with her daughter.  Mobilize more today, she has been reluctant to do this.    LOS: 4 days    Benelli Winther 03/05/2019 [phone]

## 2019-03-08 ENCOUNTER — Ambulatory Visit: Payer: PRIVATE HEALTH INSURANCE | Admitting: Internal Medicine

## 2019-03-22 ENCOUNTER — Ambulatory Visit: Payer: Self-pay | Admitting: *Deleted

## 2019-03-22 NOTE — Telephone Encounter (Signed)
Pt reports "Ruptured addendix 02/28/2019, had appendectomy 03/01/2019 with drain placement. Pt reports increased fatigue past 2 days. New onset LGT this afternoon, 100.6. Also reports back pain, "Entire back but I over did it working in the yard lifting heavy bags." No dysuria. States drain site at abdomen "Looks red" no drainage, no abdominal pain, no cough, no nausea, vomiting, or diarrhea. Reports mild SOB at times,  is using incentive spirometer. States is staying hydrated, good appetite. Instructed pt to alert surgeon, Dr. Gerrit Friends to symptoms, given number. Pt states she will do so. Assured pt [place] would alert Dr. Milinda Cave, aware practice is presently closed. Care advise given per protocol, pt verbalizes understanding.  CB# [phone]  Reason for Disposition . [1] Fever > 100.0 F (37.8 C) AND [2] bedridden (e.g., nursing home patient, CVA, chronic illness, recovering from surgery)  Answer Assessment - Initial Assessment Questions 1. TEMPERATURE: "What is the most recent temperature?"  "How was it measured?"      100.6 2. ONSET: "When did the fever start?"      This afternoon 3. SYMPTOMS: "Do you have any other symptoms besides the fever?"  (e.g., colds, headache, sore throat, earache, cough, rash, diarrhea, vomiting, abdominal pain)    Surgical site (drain site) looks red., no drainage 4. CAUSE: If there are no symptoms, ask: "What do you think is causing the fever?"      Unsure 5. CONTACTS: "Does anyone else in the family have an infection?"     no 6. TREATMENT: "What have you done so far to treat this fever?" (e.g., medications)     Nothing yet 7. IMMUNOCOMPROMISE: "Do you have of the following: diabetes, HIV positive, splenectomy, cancer chemotherapy, chronic steroid treatment, transplant patient, etc."     Ruptured appendix 02/28/2019  Protocols used: FEVER-A-AH

## 2019-03-24 ENCOUNTER — Telehealth: Payer: Self-pay | Admitting: Surgery

## 2019-03-24 NOTE — Telephone Encounter (Signed)
[  The Covid-19 virus has disrupted normal medical care in [place] and across the nation.  We have sometimes had to alter normal surgical/medical care to limit this epidemic and we have explained these changes to the patient.]  Ms. Greany had a lap appendectomy on 03/01/2019 for a perforated appendix by Dr. Gasper Lloyd.  She was seen post op by Puja on 03/15/2019.  She has gone motorcycle riding because she felt so good.  She now has pain around her umbilicus, discoloration around her umbilical incision, and a low grade fever in the evening (100.6).  She is eating and having no generalized abdominal pain.  Though a little late, I suspect that she has a superficial wound infection at her umbilicus.  She is to put warm soaks on the umbilicus 4 x a day.  She will check with our office on Monday.  Ovidio Kin, MD, [facility] Pager: [phone] Office phone:  [phone]

## 2019-03-26 NOTE — Telephone Encounter (Signed)
LMTCB for progress report and if she was able to get in contact with surgeon.

## 2019-03-27 MED ORDER — HYDROCHLOROTHIAZIDE 12.5 MG PO CAPS: 12.5000 mg | ORAL_CAPSULE | Freq: Every day | ORAL | 0 refills | Status: DC

## 2019-03-27 NOTE — Telephone Encounter (Signed)
Noted.  Nothing further to add at this time.

## 2019-03-27 NOTE — Addendum Note (Signed)
Addended by: Emi Holes D on: 03/27/2019 09:38 AM   Modules accepted: Orders

## 2019-03-27 NOTE — Telephone Encounter (Signed)
FYI:  SW pt this morning, she was able to contact surgeon and he told her to do warm compress and take 2-3 showers a day. She is sore but feeling a little better.

## 2019-04-03 ENCOUNTER — Other Ambulatory Visit: Payer: Self-pay | Admitting: Family Medicine

## 2019-04-03 DIAGNOSIS — M545 Low back pain, unspecified: Secondary | ICD-10-CM

## 2019-04-03 DIAGNOSIS — M7541 Impingement syndrome of right shoulder: Secondary | ICD-10-CM

## 2019-04-04 ENCOUNTER — Other Ambulatory Visit: Payer: Self-pay | Admitting: Family Medicine

## 2019-04-04 ENCOUNTER — Telehealth: Payer: Self-pay | Admitting: Family Medicine

## 2019-04-04 NOTE — Telephone Encounter (Signed)
Copied from CRM [id number]. Topic: Quick Communication - See Telephone Encounter >> Apr 04, 2019  4:25 PM Terisa Starr wrote: CRM for notification. See Telephone encounter for: 04/04/19.  Pt states pharmacy did not receive her script for hydrochlorothiazide (MICROZIDE) 12.5 MG capsule . She just left from getting her other medications and they do not have it. Please re-send. Thanks

## 2019-04-05 NOTE — Telephone Encounter (Signed)
SW Jonny Ruiz, [facility] pharmacy and verified script was sent for HCTZ and confirmed.   MyChart message sent.

## 2019-05-16 ENCOUNTER — Ambulatory Visit: Payer: PRIVATE HEALTH INSURANCE | Admitting: Family Medicine

## 2019-05-16 NOTE — Progress Notes (Deleted)
OFFICE VISIT  05/16/2019   CC: No chief complaint on file.    HPI:    Patient is a 53 y.o. Caucasian female who presents for right shoulder pain.  Past Medical History:  Diagnosis Date  . Adult ADHD    managed by Dr. Evelene CroonKaur  . Anxiety and depression    Managed by Dr. Evelene CroonKaur.  Marland Kitchen. BRVO (branch retinal vein occlusion) 2014   R eye 2014--followed by Dr. Luciana Axeankin.  MRI brain 11/19/15 showed mildly advanced generalized atrophy for age, +chronic small vessel ischemic changes, no acute finding.  . Depression   . Family history of colon cancer    11/2017 colonoscopy NORM.  Recall 5 yrs (Dr. Rhea BeltonPyrtle)  . Postmenopausal vaginal bleeding 05/2017   Pelvic u/s 06/2017 with thickened endometrium---referred to GYN for endometrial biopsy.  . Stroke [facility]) 2014   rt.eye    Past Surgical History:  Procedure Laterality Date  . BREAST ENHANCEMENT SURGERY    . Carotid dopplers  09/19/2015   NORMAL  . chin implant    . COLONOSCOPY  11/30/2017   NORMAL: recall 5 yrs ([facility])  . FINGER SURGERY     cyst  . LAPAROSCOPIC APPENDECTOMY N/A 03/01/2019   Procedure: APPENDECTOMY LAPAROSCOPIC WITH DRAIN PLACEMENT;  Surgeon: Darnell LevelGerkin, Todd, MD;  Location: [facility];  Service: General;  Laterality: N/A;  . NECK SURGERY  REMOTE past   lymph node excision--benign per pt report  . SALIVARY GLAND SURGERY     stone   . TMJ ARTHROPLASTY      Outpatient Medications Prior to Visit  Medication Sig Dispense Refill  . acetaminophen (TYLENOL) 500 MG tablet Take 1000 mg every 6 hours as needed for pain.  You can also use your meloxicam daily as previously prescribed. Do not exceed 4000 mg of Tylenol/acetaminophen per day it can harm your liver.  If the Tylenol and meloxicam do not control your pain you can use the oxycodone as prescribed.  We will not refill the oxycodone. 30 tablet 0  . amphetamine-dextroamphetamine (ADDERALL) 20 MG tablet Take 20 mg by mouth 3 (three) times daily.    Marland Kitchen. BIOTIN PO Take 1 tablet by mouth daily.     Marland Kitchen. buPROPion (WELLBUTRIN SR) 150 MG 12 hr tablet Take 150 mg by mouth 2 (two) times daily.     . cyclobenzaprine (FLEXERIL) 10 MG tablet Take 1 tablet (10 mg total) by mouth 2 (two) times daily as needed for muscle spasms. 180 tablet 0  . diazepam (VALIUM) 10 MG tablet Take 30 tablets by mouth at bedtime as needed for sleep.     Marland Kitchen. FLUoxetine (PROZAC) 20 MG tablet Take 60 mg by mouth daily.     . hydrochlorothiazide (MICROZIDE) 12.5 MG capsule Take 1 capsule (12.5 mg total) by mouth daily. 90 capsule 0  . meloxicam (MOBIC) 15 MG tablet TAKE 1 TABLET BY MOUTH EVERY DAY 90 tablet 0  . Multiple Vitamins-Minerals (CENTRUM SILVER PO) Take 1 tablet daily by mouth.    . oxyCODONE (OXY IR/ROXICODONE) 5 MG immediate release tablet Take 1-2 tablets (5-10 mg total) by mouth every 4 (four) hours as needed for severe pain. 15 tablet 0  . pantoprazole (PROTONIX) 40 MG tablet TAKE 1 TABLET BY MOUTH EVERY DAY 90 tablet 1  . saccharomyces boulardii (FLORASTOR) 250 MG capsule This is a probiotic to help replace some healthy bacteria to your colon.  You can buy this over-the-counter at any drugstore.  Follow the package directions and use this for the  next 28 days.  You do not need a prescription for this.    . sodium chloride (OCEAN) 0.65 % SOLN nasal spray Place 2 sprays into both nostrils as needed for congestion.    Marland Kitchen tolterodine (DETROL LA) 4 MG 24 hr capsule Take 4 mg by mouth every other day.   6   No facility-administered medications prior to visit.     Allergies  Allergen Reactions  . Penicillins Anaphylaxis  . Sulfa Antibiotics Anaphylaxis    ROS As per HPI  PE: Last menstrual period 12/11/2013. ***  LABS:  None today    Chemistry      Component Value Date/Time   NA 141 03/02/2019 0422   K 3.8 03/02/2019 0422   CL 112 (H) 03/02/2019 0422   CO2 23 03/02/2019 0422   BUN 11 03/02/2019 0422   CREATININE 0.59 03/02/2019 0422      Component Value Date/Time   CALCIUM 8.0 (L) 03/02/2019  0422   ALKPHOS 79 02/28/2019 2241   AST 14 (L) 02/28/2019 2241   ALT 17 02/28/2019 2241   BILITOT 1.4 (H) 02/28/2019 2241      IMPRESSION AND PLAN:  No problem-specific Assessment & Plan notes found for this encounter.   An After Visit Summary was printed and given to the patient.  FOLLOW UP: No follow-ups on file.  Signed:  Santiago Bumpers, MD           05/16/2019

## 2019-06-07 ENCOUNTER — Encounter: Payer: Self-pay | Admitting: Physician Assistant

## 2019-06-07 ENCOUNTER — Other Ambulatory Visit: Payer: Self-pay

## 2019-06-07 ENCOUNTER — Ambulatory Visit (INDEPENDENT_AMBULATORY_CARE_PROVIDER_SITE_OTHER): Payer: PRIVATE HEALTH INSURANCE | Admitting: Physician Assistant

## 2019-06-07 VITALS — BP 127/72 | HR 87

## 2019-06-07 DIAGNOSIS — B029 Zoster without complications: Secondary | ICD-10-CM

## 2019-06-07 HISTORY — DX: Zoster without complications: B02.9

## 2019-06-07 MED ORDER — VALACYCLOVIR HCL 1 G PO TABS: 1000.0000 mg | ORAL_TABLET | Freq: Three times a day (TID) | ORAL | 0 refills | Status: DC

## 2019-06-07 NOTE — Patient Instructions (Signed)
Instructions sent to MyChart.  Please take the Valtrex as directed. Continue Lidocaine spray and Tylenol when needed for pain/discomfort. Keep area covered as this is contagious until blisters have scabbed over.  Avoid contact with the elderly, infants and pregnant women.  Please follow-up with Dr. Milinda CaveMcGowen tomorrow as scheduled.   Shingles  Shingles is an infection. It gives you a painful skin rash and blisters that have fluid in them. Shingles is caused by the same germ (virus) that causes chickenpox. Shingles only happens in people who:  Have had chickenpox.  Have been given a shot of medicine (vaccine) to protect against chickenpox. Shingles is rare in this group. The first symptoms of shingles may be itching, tingling, or pain in an area on your skin. A rash will show on your skin a few days or weeks later. The rash is likely to be on one side of your body. The rash usually has a shape like a belt or a band. Over time, the rash turns into fluid-filled blisters. The blisters will break open, change into scabs, and dry up. Medicines may:  Help with pain and itching.  Help you get better sooner.  Help to prevent long-term problems. Follow these instructions at home: Medicines  Take over-the-counter and prescription medicines only as told by your doctor.  Put on an anti-itch cream or numbing cream where you have a rash, blisters, or scabs. Do this as told by your doctor. Helping with itching and discomfort   Put cold, wet cloths (cold compresses) on the area of the rash or blisters as told by your doctor.  Cool baths can help you feel better. Try adding baking soda or dry oatmeal to the water to lessen itching. Do not bathe in hot water. Blister and rash care  Keep your rash covered with a loose bandage (dressing).  Wear loose clothing that does not rub on your rash.  Keep your rash and blisters clean. To do this, wash the area with mild soap and cool water as told by your  doctor.  Check your rash every day for signs of infection. Check for: ? More redness, swelling, or pain. ? Fluid or blood. ? Warmth. ? Pus or a bad smell.  Do not scratch your rash. Do not pick at your blisters. To help you to not scratch: ? Keep your fingernails clean and cut short. ? Wear gloves or mittens when you sleep, if scratching is a problem. General instructions  Rest as told by your doctor.  Keep all follow-up visits as told by your doctor. This is important.  Wash your hands often with soap and water. If soap and water are not available, use hand sanitizer. Doing this lowers your chance of getting a skin infection caused by germs (bacteria).  Your infection can cause chickenpox in people who have never had chickenpox or never got a shot of chickenpox vaccine. If you have blisters that did not change into scabs yet, try not to touch other people or be around other people, especially: ? Babies. ? Pregnant women. ? Children who have areas of red, itchy, or rough skin (eczema). ? Very old people who have transplants. ? People who have a long-term (chronic) sickness, like cancer or AIDS. Contact a doctor if:  Your pain does not get better with medicine.  Your pain does not get better after the rash heals.  You have any signs of infection in the rash area. These signs include: ? More redness, swelling, or pain around  the rash. ? Fluid or blood coming from the rash. ? The rash area feeling warm to the touch. ? Pus or a bad smell coming from the rash. Get help right away if:  The rash is on your face or nose.  You have pain in your face or pain by your eye.  You lose feeling on one side of your face.  You have trouble seeing.  You have ear pain, or you have ringing in your ear.  You have a loss of taste.  Your condition gets worse. Summary  Shingles gives you a painful skin rash and blisters that have fluid in them.  Shingles is an infection. It is caused by  the same germ (virus) that causes chickenpox.  Keep your rash covered with a loose bandage (dressing). Wear loose clothing that does not rub on your rash.  If you have blisters that did not change into scabs yet, try not to touch other people or be around people. This information is not intended to replace advice given to you by your health care provider. Make sure you discuss any questions you have with your health care provider. Document Released: 05/17/2008 Document Revised: 08/03/2017 Document Reviewed: 08/03/2017 Elsevier Interactive Patient Education  2019 [employer].

## 2019-06-07 NOTE — Progress Notes (Signed)
Virtual Visit via Video   I connected with patient on 06/07/19 at 10:00 AM EDT by a video enabled telemedicine application and verified that I am speaking with the correct person using two identifiers.  Location patient: [place] [facility] Persons participating in the virtual visit: Patient, Provider, PA-Student Hervey Ard(Julie McCarter), CMA (Rene PaciPatina Moore)  I discussed the limitations of evaluation and management by telemedicine and the availability of in person appointments. The patient expressed understanding and agreed to proceed.  Subjective:   HPI:   Patient presents today via [employer] for assessment of rash. Patient endorses a bump of left side near waistline on Tuesday night that was itching and burning. Thought this was a bug bite. She applied some anti itch cream to the area with minimal relief. Notes since then she has developed 8-9 blistering lesions as of yesterday morning that are in the same area and moving around to left lower back. Lesions are burning, not painful. Using calmaseptin lotion Spray with lidocaine (for toes). Patient with Hx of cold sores, Hx of chickenpox. Denies any known history of shingles rash. Denies any changes in soaps or detergents, no draining, fever or chills. Notes she has been under a significant amount of stress recently.   ROS:   See pertinent positives and negatives per HPI.  Patient Active Problem List   Diagnosis Date Noted  . Acute appendicitis with rupture 03/01/2019  . Mood disorder (HCC) 10/23/2015  . Depression 09/10/2015  . Unspecified essential hypertension 01/15/2014  . ADD (attention deficit disorder) 01/15/2014  . Anxiety state, unspecified 01/15/2014    Social History   Tobacco Use  . Smoking status: Current Some Day Smoker    Packs/day: 0.20    Years: 2.00    Pack years: 0.40    Types: Cigarettes  . Smokeless tobacco: Never Used  Substance Use Topics  . Alcohol use: Yes    Alcohol/week:  0.0 standard drinks    Comment: 1-3 beers a day    Current Outpatient Medications:  .  acetaminophen (TYLENOL) 500 MG tablet, Take 1000 mg every 6 hours as needed for pain.  You can also use your meloxicam daily as previously prescribed. Do not exceed 4000 mg of Tylenol/acetaminophen per day it can harm your liver.  If the Tylenol and meloxicam do not control your pain you can use the oxycodone as prescribed.  We will not refill the oxycodone., Disp: 30 tablet, Rfl: 0 .  amphetamine-dextroamphetamine (ADDERALL) 20 MG tablet, Take 20 mg by mouth 3 (three) times daily., Disp: , Rfl:  .  BIOTIN PO, Take 1 tablet by mouth daily., Disp: , Rfl:  .  buPROPion (WELLBUTRIN SR) 150 MG 12 hr tablet, Take 150 mg by mouth 2 (two) times daily. , Disp: , Rfl:  .  cyclobenzaprine (FLEXERIL) 10 MG tablet, Take 1 tablet (10 mg total) by mouth 2 (two) times daily as needed for muscle spasms., Disp: 180 tablet, Rfl: 0 .  diazepam (VALIUM) 10 MG tablet, Take 30 tablets by mouth at bedtime as needed for sleep. , Disp: , Rfl:  .  FLUoxetine (PROZAC) 20 MG tablet, Take 60 mg by mouth daily. , Disp: , Rfl:  .  hydrochlorothiazide (MICROZIDE) 12.5 MG capsule, Take 1 capsule (12.5 mg total) by mouth daily., Disp: 90 capsule, Rfl: 0 .  meloxicam (MOBIC) 15 MG tablet, TAKE 1 TABLET BY MOUTH EVERY DAY, Disp: 90 tablet, Rfl: 0 .  Multiple Vitamins-Minerals (CENTRUM SILVER PO), Take 1 tablet daily by  mouth., Disp: , Rfl:  .  oxyCODONE (OXY IR/ROXICODONE) 5 MG immediate release tablet, Take 1-2 tablets (5-10 mg total) by mouth every 4 (four) hours as needed for severe pain., Disp: 15 tablet, Rfl: 0 .  pantoprazole (PROTONIX) 40 MG tablet, TAKE 1 TABLET BY MOUTH EVERY DAY, Disp: 90 tablet, Rfl: 1 .  saccharomyces boulardii (FLORASTOR) 250 MG capsule, This is a probiotic to help replace some healthy bacteria to your colon.  You can buy this over-the-counter at any drugstore.  Follow the package directions and use this for the next 28  days.  You do not need a prescription for this., Disp: , Rfl:  .  sodium chloride (OCEAN) 0.65 % SOLN nasal spray, Place 2 sprays into both nostrils as needed for congestion., Disp: , Rfl:  .  tolterodine (DETROL LA) 4 MG 24 hr capsule, Take 4 mg by mouth every other day. , Disp: , Rfl: 6  Allergies  Allergen Reactions  . Penicillins Anaphylaxis  . Sulfa Antibiotics Anaphylaxis    Objective:   BP 127/72   Pulse 87   LMP 12/11/2013   Patient is well-developed, well-nourished in no acute distress.  Resting comfortably at home.  Head is normocephalic, atraumatic.  No labored breathing.  Speech is clear and coherent with logical contest.  Patient is alert and oriented at baseline.  Papulovesicular rash in a clustered distribution of left lower back and side, following a dermatomal distribution. Rash does not cross the midline. Concerning for shingles. No evidence on visual examination of superimposed bacterial infection.  Assessment and Plan:   1. Herpes zoster without complication Start Valtrex 1 g TID x 7 days. Continue lidocaine spray. OTC pain meds as needed. Discussed contagious nature of rash before crusting of lesions. Handout sent to MyChart. She has an appt with her PCP tomorrow. - valACYclovir (VALTREX) 1000 MG tablet; Take 1 tablet (1,000 mg total) by mouth 3 (three) times daily.  Dispense: 21 tablet; Refill: 0   [name], [place] 06/07/2019

## 2019-06-07 NOTE — Progress Notes (Signed)
I have discussed the procedure for the virtual visit with the patient who has given consent to proceed with assessment and treatment.   [address] S Tranika Scholler, CMA     

## 2019-06-08 ENCOUNTER — Ambulatory Visit: Payer: PRIVATE HEALTH INSURANCE | Admitting: Family Medicine

## 2019-06-19 ENCOUNTER — Ambulatory Visit: Payer: PRIVATE HEALTH INSURANCE | Admitting: Nurse Practitioner

## 2019-06-25 ENCOUNTER — Other Ambulatory Visit: Payer: Self-pay | Admitting: Family Medicine

## 2019-06-25 DIAGNOSIS — M545 Low back pain, unspecified: Secondary | ICD-10-CM

## 2019-06-25 DIAGNOSIS — M7541 Impingement syndrome of right shoulder: Secondary | ICD-10-CM

## 2019-06-25 NOTE — Telephone Encounter (Signed)
RF request for meloxicam.  Last OV 01/04/2019 Next OV 07/05/2019 Last RX 04/03/2019 # 90 No rf.  Please advise.

## 2019-07-03 ENCOUNTER — Telehealth: Payer: Self-pay

## 2019-07-03 ENCOUNTER — Other Ambulatory Visit: Payer: Self-pay | Admitting: Physician Assistant

## 2019-07-03 DIAGNOSIS — B029 Zoster without complications: Secondary | ICD-10-CM

## 2019-07-03 NOTE — Telephone Encounter (Signed)
Received voicemail for refill of Valtrex. Pt had appt with Einar Pheasant, PA on 06/07/19 for shingles and given Valtrex (21,0). She stated on the VM that she is out and needing more.  Please advise, thanks.

## 2019-07-04 ENCOUNTER — Telehealth: Payer: Self-pay

## 2019-07-04 ENCOUNTER — Encounter: Payer: Self-pay | Admitting: Family Medicine

## 2019-07-04 NOTE — Telephone Encounter (Signed)
Pls notify pt that taking more valtrex is not indicated. No more than a 7 day course is prescribed for shingles.  Taking any more than this is not going to be more helpful unfortunately.-thx

## 2019-07-04 NOTE — Telephone Encounter (Signed)
Patient called back. Patient states her shingles went away and now they have come back. I encouraged patient to keep her 6 month appointment that was already scheduled for tomorrow as a virtual visit so that she can discuss recurrence of shingles.

## 2019-07-04 NOTE — Telephone Encounter (Signed)
Copied from [place] [id number]. Topic: Appointment Scheduling - Transfer of Care >> Jul 04, 2019  1:45 PM Oneta Rack wrote: Pt is requesting to transfer FROM: Dr. Anitra Lauth  Pt is requesting to transfer TO: Elyn Aquas  Reason for requested transfer: patient didn't want to elaborate why. Patient states she likes how Einar Pheasant conducted himself on virtual visit on 06/07/2019.  Send CRM to patient's current PCP (transferring FROM).

## 2019-07-04 NOTE — Telephone Encounter (Signed)
OK with me.

## 2019-07-04 NOTE — Telephone Encounter (Signed)
Ok with me 

## 2019-07-04 NOTE — Telephone Encounter (Signed)
LM for pt to return call to be further advised.

## 2019-07-05 ENCOUNTER — Encounter: Payer: Self-pay | Admitting: Physician Assistant

## 2019-07-05 ENCOUNTER — Ambulatory Visit (INDEPENDENT_AMBULATORY_CARE_PROVIDER_SITE_OTHER): Payer: PRIVATE HEALTH INSURANCE | Admitting: Physician Assistant

## 2019-07-05 ENCOUNTER — Ambulatory Visit: Payer: PRIVATE HEALTH INSURANCE | Admitting: Family Medicine

## 2019-07-05 ENCOUNTER — Other Ambulatory Visit: Payer: Self-pay

## 2019-07-05 VITALS — BP 124/84

## 2019-07-05 DIAGNOSIS — L249 Irritant contact dermatitis, unspecified cause: Secondary | ICD-10-CM | POA: Diagnosis not present

## 2019-07-05 MED ORDER — PREDNISONE 10 MG PO TABS: ORAL_TABLET | ORAL | 0 refills | Status: DC

## 2019-07-05 NOTE — Progress Notes (Signed)
I have discussed the procedure for the virtual visit with the patient who has given consent to proceed with assessment and treatment.   Haley Martin, CMA     

## 2019-07-05 NOTE — Progress Notes (Signed)
Virtual Visit via Video   I connected with patient on 07/05/19 at 11:30 AM EDT by a video enabled telemedicine application and verified that I am speaking with the correct person using two identifiers.  Location patient: Home Location provider: [facility], Office Persons participating in the virtual visit: Patient, Provider, [place] (Patina Moore)  I discussed the limitations of evaluation and management by telemedicine and the availability of in person appointments. The patient expressed understanding and agreed to proceed.  Subjective:   HPI:   Patient presents to clinic today c/o rash on bilateral forearms, legs, chest and back that she initially thought was a recurrence of shingles rash that she was seen and treated for a month ago. Notes rash is itching in nature. Some areas are raised and blistery and others are just reddened and pruritic. Denies change to soaps, lotions or detergents but she does not that right at symptom onset she had stayed with her bf a few days and was using some of his products and laundry detergent. Denies fever, chills, malaise or fatigue. Denies noted insect bite. Denies new pet.    ROS:   See pertinent positives and negatives per HPI.  Patient Active Problem List   Diagnosis Date Noted  . Acute appendicitis with rupture 03/01/2019  . Mood disorder ([place]) 10/23/2015  . Depression 09/10/2015  . Unspecified essential hypertension 01/15/2014  . ADD (attention deficit disorder) 01/15/2014  . Anxiety state, unspecified 01/15/2014    Social History   Tobacco Use  . Smoking status: Current Some Day Smoker    Packs/day: 0.20    Years: 2.00    Pack years: 0.40    Types: Cigarettes  . Smokeless tobacco: Never Used  Substance Use Topics  . Alcohol use: Yes    Alcohol/week: 0.0 standard drinks    Comment: 1-3 beers a day    Current Outpatient Medications:  .  acetaminophen (TYLENOL) 500 MG tablet, Take 1000 mg every 6 hours as needed for  pain.  You can also use your meloxicam daily as previously prescribed. Do not exceed 4000 mg of Tylenol/acetaminophen per day it can harm your liver.  If the Tylenol and meloxicam do not control your pain you can use the oxycodone as prescribed.  We will not refill the oxycodone., Disp: 30 tablet, Rfl: 0 .  amphetamine-dextroamphetamine (ADDERALL) 20 MG tablet, Take 20 mg by mouth 3 (three) times daily., Disp: , Rfl:  .  BIOTIN PO, Take 1 tablet by mouth daily., Disp: , Rfl:  .  buPROPion (WELLBUTRIN SR) 150 MG 12 hr tablet, Take 150 mg by mouth 2 (two) times daily. , Disp: , Rfl:  .  cyclobenzaprine (FLEXERIL) 10 MG tablet, Take 1 tablet (10 mg total) by mouth 2 (two) times daily as needed for muscle spasms., Disp: 180 tablet, Rfl: 0 .  diazepam (VALIUM) 10 MG tablet, Take 30 tablets by mouth at bedtime as needed for sleep. , Disp: , Rfl:  .  FLUoxetine (PROZAC) 20 MG tablet, Take 60 mg by mouth daily. , Disp: , Rfl:  .  hydrochlorothiazide (MICROZIDE) 12.5 MG capsule, Take 1 capsule (12.5 mg total) by mouth daily., Disp: 90 capsule, Rfl: 0 .  meloxicam (MOBIC) 15 MG tablet, TAKE 1 TABLET BY MOUTH EVERY DAY, Disp: 90 tablet, Rfl: 3 .  Multiple Vitamins-Minerals (CENTRUM SILVER PO), Take 1 tablet daily by mouth., Disp: , Rfl:  .  oxyCODONE (OXY IR/ROXICODONE) 5 MG immediate release tablet, Take 1-2 tablets (5-10 mg total) by mouth  every 4 (four) hours as needed for severe pain., Disp: 15 tablet, Rfl: 0 .  pantoprazole (PROTONIX) 40 MG tablet, TAKE 1 TABLET BY MOUTH EVERY DAY, Disp: 90 tablet, Rfl: 1 .  saccharomyces boulardii (FLORASTOR) 250 MG capsule, This is a probiotic to help replace some healthy bacteria to your colon.  You can buy this over-the-counter at any drugstore.  Follow the package directions and use this for the next 28 days.  You do not need a prescription for this., Disp: , Rfl:  .  sodium chloride (OCEAN) 0.65 % SOLN nasal spray, Place 2 sprays into both nostrils as needed for  congestion., Disp: , Rfl:  .  tolterodine (DETROL LA) 4 MG 24 hr capsule, Take 4 mg by mouth every other day. , Disp: , Rfl: 6 .  valACYclovir (VALTREX) 1000 MG tablet, Take 1 tablet (1,000 mg total) by mouth 3 (three) times daily., Disp: 21 tablet, Rfl: 0 .  predniSONE (DELTASONE) 10 MG tablet, Take 4 tablets (40 mg total) by mouth daily with breakfast for 3 days, THEN 3 tablets (30 mg total) daily with breakfast for 3 days, THEN 2 tablets (20 mg total) daily with breakfast for 3 days, THEN 1 tablet (10 mg total) daily with breakfast for 3 days., Disp: 30 tablet, Rfl: 0  Allergies  Allergen Reactions  . Penicillins Anaphylaxis  . Sulfa Antibiotics Anaphylaxis    Objective:   BP 124/84   LMP 12/11/2013   Patient is well-developed, well-nourished in no acute distress.  Resting comfortably at home.  Head is normocephalic, atraumatic.  No labored breathing.  Speech is clear and coherent with logical contest.  Patient is alert and oriented at baseline.  Papulovesicular lesions on erythematous base -- linear distribution of bilateral arms, more clustered on legs, chest -- concerning for dermatitis.   Assessment and Plan:   1. Irritant contact dermatitis, unspecified trigger Widespread with significant pruritus. Supportive measures and OTC medications reviewed with patient. Rx Prednisone taper to take as directed. If not resolving, she is to follow-up in office.     Piedad ClimesWilliam Cody Aeisha Minarik, PA-C 07/05/2019

## 2019-07-05 NOTE — Patient Instructions (Signed)
Instructions sent to MyChart.  Please take the steroid as directed. Keep skin clean and dry. You can apply some topical Witch Hazel astringent to help soothe and dry up these areas.  Avoid new soaps, lotions, etc. Avoid use of your BFs detergent.  Let me know if things are not improving or if anything worsens.   Hang in there!   Contact Dermatitis Dermatitis is redness, soreness, and swelling (inflammation) of the skin. Contact dermatitis is a reaction to something that touches the skin. There are two types of contact dermatitis:  Irritant contact dermatitis. This happens when something bothers (irritates) your skin, like soap.  Allergic contact dermatitis. This is caused when you are exposed to something that you are allergic to, such as poison ivy. What are the causes?  Common causes of irritant contact dermatitis include: ? Makeup. ? Soaps. ? Detergents. ? Bleaches. ? Acids. ? Metals, such as nickel.  Common causes of allergic contact dermatitis include: ? Plants. ? Chemicals. ? Jewelry. ? Latex. ? Medicines. ? Preservatives in products, such as clothing. What increases the risk?  Having a job that exposes you to things that bother your skin.  Having asthma or eczema. What are the signs or symptoms? Symptoms may happen anywhere the irritant has touched your skin. Symptoms include:  Dry or flaky skin.  Redness.  Cracks.  Itching.  Pain or a burning feeling.  Blisters.  Blood or clear fluid draining from skin cracks. With allergic contact dermatitis, swelling may occur. This may happen in places such as the eyelids, mouth, or genitals. How is this treated?  This condition is treated by checking for the cause of the reaction and protecting your skin. Treatment may also include: ? Steroid creams, ointments, or medicines. ? Antibiotic medicines or other ointments, if you have a skin infection. ? Lotion or medicines to help with itching. ? A bandage  (dressing). Follow these instructions at home: Skin care  Moisturize your skin as needed.  Put cool cloths on your skin.  Put a baking soda paste on your skin. Stir water into baking soda until it looks like a paste.  Do not scratch your skin.  Avoid having things rub up against your skin.  Avoid the use of soaps, perfumes, and dyes. Medicines  Take or apply over-the-counter and prescription medicines only as told by your doctor.  If you were prescribed an antibiotic medicine, take or apply it as told by your doctor. Do not stop using it even if your condition starts to get better. Bathing  Take a bath with: ? Epsom salts. ? Baking soda. ? Colloidal oatmeal.  Bathe less often.  Bathe in warm water. Avoid using hot water. Bandage care  If you were given a bandage, change it as told by your health care provider.  Wash your hands with soap and water before and after you change your bandage. If soap and water are not available, use hand sanitizer. General instructions  Avoid the things that caused your reaction. If you do not know what caused it, keep a journal. Write down: ? What you eat. ? What skin products you use. ? What you drink. ? What you wear in the area that has symptoms. This includes jewelry.  Check the affected areas every day for signs of infection. Check for: ? More redness, swelling, or pain. ? More fluid or blood. ? Warmth. ? Pus or a bad smell.  Keep all follow-up visits as told by your doctor. This is important.  Contact a doctor if:  You do not get better with treatment.  Your condition gets worse.  You have signs of infection, such as: ? More swelling. ? Tenderness. ? More redness. ? Soreness. ? Warmth.  You have a fever.  You have new symptoms. Get help right away if:  You have a very bad headache.  You have neck pain.  Your neck is stiff.  You throw up (vomit).  You feel very sleepy.  You see red streaks coming from the  area.  Your bone or joint near the area hurts after the skin has healed.  The area turns darker.  You have trouble breathing. Summary  Dermatitis is redness, soreness, and swelling of the skin.  Symptoms may occur where the irritant has touched you.  Treatment may include medicines and skin care.  If you do not know what caused your reaction, keep a journal.  Contact a doctor if your condition gets worse or you have signs of infection. This information is not intended to replace advice given to you by your health care provider. Make sure you discuss any questions you have with your health care provider. Document Released: 09/26/2009 Document Revised: 03/21/2019 Document Reviewed: 06/14/2018 Elsevier Patient Education  2020 [employer].

## 2019-07-06 ENCOUNTER — Other Ambulatory Visit: Payer: Self-pay

## 2019-07-06 NOTE — Telephone Encounter (Signed)
Pt has appt set for 7/31 with PCP, Einar Pheasant.

## 2019-07-10 ENCOUNTER — Telehealth: Payer: Self-pay | Admitting: General Surgery

## 2019-07-10 NOTE — Telephone Encounter (Signed)
Covid-19 screening questions   Do you now or have you had a fever in the last 14 days?  Do you have any respiratory symptoms of shortness of breath or cough now or in the last 14 days?  Do you have any family members or close contacts with diagnosed or suspected Covid-19 in the past 14 days?  Have you been tested for Covid-19 and found to be positive?       

## 2019-07-10 NOTE — Telephone Encounter (Signed)
Left a voicemail on the patients mobile to call us to Covid screen for appointment in office on 07/11/2019.

## 2019-07-11 ENCOUNTER — Ambulatory Visit: Payer: PRIVATE HEALTH INSURANCE | Admitting: Nurse Practitioner

## 2019-07-13 ENCOUNTER — Other Ambulatory Visit: Payer: Self-pay | Admitting: Physician Assistant

## 2019-07-13 ENCOUNTER — Encounter: Payer: PRIVATE HEALTH INSURANCE | Admitting: Physician Assistant

## 2019-07-13 DIAGNOSIS — M545 Low back pain, unspecified: Secondary | ICD-10-CM

## 2019-07-13 MED ORDER — CYCLOBENZAPRINE HCL 10 MG PO TABS: 10.0000 mg | ORAL_TABLET | Freq: Two times a day (BID) | ORAL | 0 refills | Status: DC | PRN

## 2019-07-13 NOTE — Telephone Encounter (Signed)
Refill sent.

## 2019-07-13 NOTE — Telephone Encounter (Signed)
Copied from [place] [id number]. Topic: Quick Communication - Rx Refill/Question >> Jul 13, 2019 10:02 AM Erick Blinks wrote: Medication: cyclobenzaprine (FLEXERIL) 10 MG tablet -Pt called to report that mother had a heart attack last night and father is paralyzed. By lifting her father she has strained her shoulder muscles, and is requesting refill of this Rx. Please advise   Has the patient contacted their pharmacy? No (Agent: If no, request that the patient contact the pharmacy for the refill.) (Agent: If yes, when and what did the pharmacy advise?)  Preferred Pharmacy (with phone number or street name): [id number] - [place], [place] [address] [place] [address] Phone: [phone] Fax: [phone]    Agent: Please be advised that RX refills may take up to 3 business days. We ask that you follow-up with your pharmacy.

## 2019-07-16 ENCOUNTER — Telehealth: Payer: Self-pay

## 2019-07-16 MED ORDER — PREDNISONE 10 MG PO TABS: ORAL_TABLET | ORAL | 0 refills | Status: DC

## 2019-07-16 NOTE — Telephone Encounter (Signed)
Patient stated that the rash went completely away. Patient believes that the rash is being caused by an organic dryer sheet that her boyfriend uses. Both cases of the rash have occurred after spending time at her boyfriend's house. 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

## 2019-07-16 NOTE — Telephone Encounter (Signed)
Patient is aware that steroid has been sent into pharmacy. Also aware to call and schedule appt in office visit if symptoms do not get better.

## 2019-07-16 NOTE — Telephone Encounter (Signed)
I have sent in a refill of steroid taper.  She needs to avoid any contact of skin with this known trigger.  Otherwise reactions will be worse each time. If not resolving with this, she will need to be seen in office or referred to dermatology

## 2019-07-16 NOTE — Addendum Note (Signed)
Addended by: Brunetta Jeans on: 07/16/2019 04:04 PM   Modules accepted: Orders

## 2019-07-16 NOTE — Telephone Encounter (Signed)
She is clearly reacting to something. Did the rash go completely away and then recur or did it never completely resolve? Would say we would need to assess in office or have a dermatologist assess. Any further information you can glean will be appreciated.

## 2019-07-16 NOTE — Telephone Encounter (Signed)
Copied from [facility] [id number]. Topic: General - Other >> Jul 16, 2019  2:24 PM Wynetta Emery, [place] C wrote: Reason for CRM: pt called in to be advised. Pt says that she is having another dermatitis outbreak and would like to know what she should do? Pt says that she has been seen for this not long ago.   Pharmacy: [id number] [place], [place] [phone] (Phone) [phone] (Fax)

## 2019-07-20 ENCOUNTER — Ambulatory Visit (INDEPENDENT_AMBULATORY_CARE_PROVIDER_SITE_OTHER): Payer: PRIVATE HEALTH INSURANCE | Admitting: Physician Assistant

## 2019-07-20 ENCOUNTER — Encounter: Payer: Self-pay | Admitting: Physician Assistant

## 2019-07-20 ENCOUNTER — Other Ambulatory Visit: Payer: Self-pay

## 2019-07-20 VITALS — BP 130/80 | HR 100 | Temp 98.3°F | Resp 16 | Ht 64.0 in | Wt 171.0 lb

## 2019-07-20 DIAGNOSIS — B889 Infestation, unspecified: Secondary | ICD-10-CM | POA: Diagnosis not present

## 2019-07-20 DIAGNOSIS — F430 Acute stress reaction: Secondary | ICD-10-CM | POA: Diagnosis not present

## 2019-07-20 DIAGNOSIS — L810 Postinflammatory hyperpigmentation: Secondary | ICD-10-CM

## 2019-07-20 MED ORDER — PERMETHRIN 5 % EX CREA: 1.0000 "application " | TOPICAL_CREAM | Freq: Once | CUTANEOUS | 0 refills | Status: AC

## 2019-07-20 NOTE — Progress Notes (Signed)
Patient presents to clinic today to formally transfer care from our [facility] office. Patient recently seen via video visit by this provider for rash felt to be a contact dermatitis based on symptoms and limited skin examination via video.  Patient took entire course of steroid taper with complete resolution of symptoms.  Had a recurrence after this after she visited her boyfriend's house.  Was given another course of steroid and rash has mostly resolved.  Notes some residual darkening of skin of this area.  Patient notes she seems to get this rash every time she is at her boyfriend's house and sits in a particular outdoor chair.  Again denies fever, chills, malaise or fatigue.  Would like skin reexamined today.  Patient also asking for recommendations regarding counseling services.  She is dealing with a lot of stress recently related to family issues and her son's drug addiction.  He is followed by psychiatry for medication management regarding anxiety and depression.  Feels like this is stable overall but she would like to talk to someone.  Denies suicidal thought or ideation..   Past Medical History:  Diagnosis Date  . Adult ADHD    managed by Dr. Toy Care  . Anxiety and depression    Managed by Dr. Toy Care.  Marland Kitchen BRVO (branch retinal vein occlusion) 2014   R eye 2014--followed by Dr. Zadie Rhine.  MRI brain 11/19/15 showed mildly advanced generalized atrophy for age, +chronic small vessel ischemic changes, no acute finding.  . Depression   . Family history of colon cancer    11/2017 colonoscopy NORM.  Recall 5 yrs (Dr. Hilarie Fredrickson)  . Postmenopausal vaginal bleeding 05/2017   Pelvic u/s 06/2017 with thickened endometrium---referred to GYN for endometrial biopsy.  . Shingles 06/07/2019   L waistline   . Stroke [facility]) 2014   rt.eye    Current Outpatient Medications on File Prior to Visit  Medication Sig Dispense Refill  . ALPRAZolam (XANAX) 1 MG tablet Take 1 mg by mouth 3 (three) times daily.    Marland Kitchen  amphetamine-dextroamphetamine (ADDERALL) 20 MG tablet Take 20 mg by mouth 3 (three) times daily.    Marland Kitchen BIOTIN PO Take 1 tablet by mouth daily.    Marland Kitchen buPROPion (WELLBUTRIN SR) 150 MG 12 hr tablet Take 150 mg by mouth 2 (two) times daily.     . cyclobenzaprine (FLEXERIL) 10 MG tablet Take 1 tablet (10 mg total) by mouth 2 (two) times daily as needed for muscle spasms. 60 tablet 0  . FLUoxetine (PROZAC) 20 MG tablet Take 60 mg by mouth daily.     . hydrochlorothiazide (MICROZIDE) 12.5 MG capsule Take 1 capsule (12.5 mg total) by mouth daily. 90 capsule 0  . meloxicam (MOBIC) 15 MG tablet TAKE 1 TABLET BY MOUTH EVERY DAY 90 tablet 3  . Multiple Vitamins-Minerals (CENTRUM SILVER PO) Take 1 tablet daily by mouth.    . pantoprazole (PROTONIX) 40 MG tablet TAKE 1 TABLET BY MOUTH EVERY DAY 90 tablet 1  . sodium chloride (OCEAN) 0.65 % SOLN nasal spray Place 2 sprays into both nostrils as needed for congestion.    Marland Kitchen tolterodine (DETROL LA) 4 MG 24 hr capsule Take 4 mg by mouth every other day.   6   No current facility-administered medications on file prior to visit.     Allergies  Allergen Reactions  . Penicillins Anaphylaxis  . Sulfa Antibiotics Anaphylaxis    Family History  Problem Relation Age of Onset  . Hyperlipidemia Mother   .  Thyroid disease Mother   . Colon polyps Mother   . Stroke Father   . Hypertension Father   . Hyperlipidemia Father   . Cancer Maternal Grandmother        colon cancer  . Healthy Sister   . Healthy Son   . Healthy Daughter   . Colon cancer Maternal Uncle   . Breast cancer Paternal Aunt   . Lung cancer Paternal Aunt   . Bone cancer Paternal Aunt   . Colon cancer Maternal Uncle   . Esophageal cancer Neg Hx   . Rectal cancer Neg Hx   . Stomach cancer Neg Hx     Social History   Socioeconomic History  . Marital status: Divorced    Spouse name: Not on file  . Number of children: Not on file  . Years of education: Not on file  . Highest education level:  Not on file  Occupational History  . Not on file  Social Needs  . Financial resource strain: Not on file  . Food insecurity    Worry: Not on file    Inability: Not on file  . Transportation needs    Medical: Not on file    Non-medical: Not on file  Tobacco Use  . Smoking status: Current Some Day Smoker    Packs/day: 0.20    Years: 2.00    Pack years: 0.40    Types: Cigarettes  . Smokeless tobacco: Never Used  Substance and Sexual Activity  . Alcohol use: Yes    Alcohol/week: 0.0 standard drinks    Comment: 1-3 beers a day  . Drug use: No  . Sexual activity: Not on file  Lifestyle  . Physical activity    Days per week: Not on file    Minutes per session: Not on file  . Stress: Not on file  Relationships  . Social Musicianconnections    Talks on phone: Not on file    Gets together: Not on file    Attends religious service: Not on file    Active member of club or organization: Not on file    Attends meetings of clubs or organizations: Not on file    Relationship status: Not on file  Other Topics Concern  . Not on file  Social History Narrative   She works in the house cleaning business   She is divorced for 9 years   Has two children - daughter goes back and forth between.   Tob: 1 pack q 3d--current as of 02/2017.   Alc: 12 pack per week.   Review of Systems - See HPI.  All other ROS are negative.  Wt 171 lb (77.6 kg)   LMP 12/11/2013   BMI 29.35 kg/m   Physical Exam Vitals signs reviewed.  Constitutional:      Appearance: Normal appearance.  HENT:     Head: Normocephalic and atraumatic.  Neck:     Musculoskeletal: Neck supple.  Cardiovascular:     Rate and Rhythm: Normal rate.  Skin:      Neurological:     Mental Status: She is alert.  Psychiatric:        Mood and Affect: Mood normal.    Assessment/Plan: 1. Mite infestation Concern for this giving the location of repeated exposure. She is to avoid this area at her BF's house. They are having someone  come out. Rx Permethrin given.   2. Post-inflammatory hyperpigmentation Discussed etiology and prognosis. WIll monitor.   3. Acute stress reaction  Continue management per Psychiatry. Handout on counseling services within [place] given to patient with highlighted recommendations. Also gave handout on local counseling services outside [facility].    Piedad ClimesWilliam Cody Shiva Sahagian, PA-C

## 2019-07-20 NOTE — Patient Instructions (Signed)
The area on the legs is some residual hyperpigmentation from recent inflammation. Avoid sitting in the chair at your friend's house as this is the likely source of trigger.  Apply the permethrin at night. Wash off early the next morning.   The darkening should slowly resolve over time.  I want you to use the handout given to schedule a counseling appointment. You are dealing with a lot and I feel this will be extremely beneficial.

## 2019-07-23 ENCOUNTER — Encounter: Payer: PRIVATE HEALTH INSURANCE | Admitting: Physician Assistant

## 2019-08-05 ENCOUNTER — Other Ambulatory Visit: Payer: Self-pay | Admitting: Physician Assistant

## 2019-09-07 ENCOUNTER — Other Ambulatory Visit: Payer: Self-pay | Admitting: Physician Assistant

## 2019-09-07 DIAGNOSIS — M545 Low back pain, unspecified: Secondary | ICD-10-CM

## 2019-09-13 ENCOUNTER — Other Ambulatory Visit: Payer: Self-pay | Admitting: Family Medicine

## 2019-10-01 ENCOUNTER — Other Ambulatory Visit: Payer: Self-pay | Admitting: Physician Assistant

## 2019-10-01 DIAGNOSIS — M545 Low back pain, unspecified: Secondary | ICD-10-CM

## 2019-10-13 ENCOUNTER — Other Ambulatory Visit: Payer: Self-pay | Admitting: Physician Assistant

## 2019-10-13 DIAGNOSIS — M545 Low back pain, unspecified: Secondary | ICD-10-CM

## 2019-10-22 ENCOUNTER — Encounter: Payer: Self-pay | Admitting: Physician Assistant

## 2019-10-22 ENCOUNTER — Ambulatory Visit (INDEPENDENT_AMBULATORY_CARE_PROVIDER_SITE_OTHER): Payer: PRIVATE HEALTH INSURANCE | Admitting: Physician Assistant

## 2019-10-22 ENCOUNTER — Other Ambulatory Visit: Payer: Self-pay

## 2019-10-22 VITALS — BP 120/90 | HR 102

## 2019-10-22 DIAGNOSIS — F39 Unspecified mood [affective] disorder: Secondary | ICD-10-CM

## 2019-10-22 DIAGNOSIS — R21 Rash and other nonspecific skin eruption: Secondary | ICD-10-CM

## 2019-10-22 MED ORDER — TRIAMCINOLONE ACETONIDE 0.1 % EX CREA: 1.0000 "application " | TOPICAL_CREAM | Freq: Two times a day (BID) | CUTANEOUS | 0 refills | Status: DC

## 2019-10-22 NOTE — Patient Instructions (Signed)
Instructions sent to MyChart.   - Please keep skin clean and dry.  - Apply a good unscented non-dyed facial lotion (Cetaphil is a good option) - Avoid highly processed foods.  - Drink plenty of water. - Avoid applying make up to the face until this calms down. - I have sent in a prescription cream for you to apply to the areas of concern (do not apply to eyelids) over the next week.  - I do think stress is a large contributor to these outbreaks too.  - Please call your Psychiatrist to bump up your appointment.  - Consider calling [phone] to schedule a counseling appointment.  - Follow-up in 2 weeks for reassessment; sooner if needed.  Hang in there!  Seborrheic Dermatitis, Adult Seborrheic dermatitis is a skin disease that causes red, scaly patches. It usually occurs on the scalp, and it is often called dandruff. The patches may appear on other parts of the body. Skin patches tend to appear where there are many oil glands in the skin. Areas of the body that are commonly affected include:  Scalp.  Skin folds of the body.  Ears.  Eyebrows.  Neck.  Face.  Armpits.  The bearded area of men's faces. The condition may come and go for no known reason, and it is often long-lasting (chronic). What are the causes? The cause of this condition is not known. What increases the risk? This condition is more likely to develop in people who:  Have certain conditions, such as: ? HIV (human immunodeficiency virus). ? AIDS (acquired immunodeficiency syndrome). ? Parkinson disease. ? Mood disorders, such as depression.  Are 56-34 years old. What are the signs or symptoms? Symptoms of this condition include:  Thick scales on the scalp.  Redness on the face or in the armpits.  Skin that is flaky. The flakes may be white or yellow.  Skin that seems oily or dry but is not helped with moisturizers.  Itching or burning in the affected areas. How is this diagnosed? This condition  is diagnosed with a medical history and physical exam. A sample of your skin may be tested (skin biopsy). You may need to see a skin specialist (dermatologist). How is this treated? There is no cure for this condition, but treatment can help to manage the symptoms. You may get treatment to remove scales, lower the risk of skin infection, and reduce swelling or itching. Treatment may include:  Creams that reduce swelling and irritation (steroids).  Creams that reduce skin yeast.  Medicated shampoo, soaps, moisturizing creams, or ointments.  Medicated moisturizing creams or ointments. Follow these instructions at home:  Apply over-the-counter and prescription medicines only as told by your health care provider.  Use any medicated shampoo, soaps, skin creams, or ointments only as told by your health care provider.  Keep all follow-up visits as told by your health care provider. This is important. Contact a health care provider if:  Your symptoms do not improve with treatment.  Your symptoms get worse.  You have new symptoms. This information is not intended to replace advice given to you by your health care provider. Make sure you discuss any questions you have with your health care provider. Document Released: 11/29/2005 Document Revised: 11/11/2017 Document Reviewed: 03/18/2016 Elsevier Patient Education  2020 [employer].
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
## 2019-10-22 NOTE — Progress Notes (Signed)
I have discussed the procedure for the virtual visit with the patient who has given consent to proceed with assessment and treatment.   Zerah Hilyer S Donnis Pecha, CMA     

## 2019-11-01 ENCOUNTER — Other Ambulatory Visit: Payer: Self-pay | Admitting: Physician Assistant

## 2019-11-01 DIAGNOSIS — M545 Low back pain, unspecified: Secondary | ICD-10-CM

## 2019-11-13 ENCOUNTER — Other Ambulatory Visit: Payer: Self-pay | Admitting: Physician Assistant

## 2019-11-15 ENCOUNTER — Other Ambulatory Visit: Payer: Self-pay

## 2019-11-15 DIAGNOSIS — Z20822 Contact with and (suspected) exposure to covid-19: Secondary | ICD-10-CM

## 2019-11-18 LAB — NOVEL CORONAVIRUS, NAA: SARS-CoV-2, NAA: NOT DETECTED

## 2019-11-27 ENCOUNTER — Ambulatory Visit: Payer: PRIVATE HEALTH INSURANCE | Admitting: Physician Assistant

## 2019-11-29 ENCOUNTER — Ambulatory Visit: Payer: PRIVATE HEALTH INSURANCE | Admitting: Physician Assistant

## 2019-12-03 ENCOUNTER — Encounter: Payer: Self-pay | Admitting: Physician Assistant

## 2019-12-04 ENCOUNTER — Ambulatory Visit: Payer: PRIVATE HEALTH INSURANCE | Admitting: Physician Assistant

## 2019-12-25 ENCOUNTER — Ambulatory Visit: Payer: PRIVATE HEALTH INSURANCE | Admitting: Physician Assistant

## 2019-12-30 ENCOUNTER — Other Ambulatory Visit: Payer: Self-pay | Admitting: Physician Assistant

## 2019-12-31 ENCOUNTER — Telehealth: Payer: Self-pay | Admitting: *Deleted

## 2019-12-31 NOTE — Telephone Encounter (Signed)
Rx Triamcinolone 0.1% cram last refill on 11/13/2019 Last OV 10/22/2019

## 2019-12-31 NOTE — Telephone Encounter (Signed)
We will hold off on refill until her appointment this week to determine if medication is still appropriate.

## 2020-01-01 ENCOUNTER — Encounter: Payer: PRIVATE HEALTH INSURANCE | Admitting: Physician Assistant

## 2020-01-07 ENCOUNTER — Encounter: Payer: Self-pay | Admitting: Physician Assistant

## 2020-01-07 ENCOUNTER — Ambulatory Visit (INDEPENDENT_AMBULATORY_CARE_PROVIDER_SITE_OTHER): Payer: PRIVATE HEALTH INSURANCE | Admitting: Physician Assistant

## 2020-01-07 ENCOUNTER — Other Ambulatory Visit: Payer: Self-pay

## 2020-01-07 VITALS — BP 110/76 | HR 114

## 2020-01-07 DIAGNOSIS — B349 Viral infection, unspecified: Secondary | ICD-10-CM | POA: Diagnosis not present

## 2020-01-07 NOTE — Progress Notes (Signed)
Virtual Visit via Video   I connected with patient on 01/07/20 at  3:00 PM EST by a video enabled telemedicine application and verified that I am speaking with the correct person using two identifiers.  Location patient: Home Location provider: [facility], Office Persons participating in the virtual visit: Patient, Provider, CMA (Haley Martin)  I discussed the limitations of evaluation and management by telemedicine and the availability of in person appointments. The patient expressed understanding and agreed to proceed.  Subjective:   HPI:   Patient presets via [employer] today c/o 1 week of intermittent fever, occurring at night with T ranging up 100 to 103. Resolved by morning. Notes some fatigue and headache when fever present. Asymptomatic when fever is gone. Has been COVID tested twice in the past 2 weeks due to multiple exposures (friend, parents) but negative.  Denies chest pain, racing heart, lightheadedness or dizziness. Denies sinus pressure, sinus pain, ear pain, chest congestion. Denies GI symptoms, loss of taste or smell.   ROS:   See pertinent positives and negatives per HPI.  Patient Active Problem List   Diagnosis Date Noted  . Acute appendicitis with rupture 03/01/2019  . Mood disorder (HCC) 10/23/2015  . Depression 09/10/2015  . Unspecified essential hypertension 01/15/2014  . ADD (attention deficit disorder) 01/15/2014  . Anxiety state, unspecified 01/15/2014    Social History   Tobacco Use  . Smoking status: Current Some Day Smoker    Packs/day: 0.20    Years: 2.00    Pack years: 0.40    Types: Cigarettes  . Smokeless tobacco: Never Used  Substance Use Topics  . Alcohol use: Yes    Alcohol/week: 0.0 standard drinks    Comment: 1-3 beers a day    Current Outpatient Medications:  .  ALPRAZolam (XANAX) 1 MG tablet, Take 1 mg by mouth 3 (three) times daily., Disp: , Rfl:  .  amphetamine-dextroamphetamine (ADDERALL) 20 MG tablet, Take 20 mg by  mouth 3 (three) times daily., Disp: , Rfl:  .  BIOTIN PO, Take 1 tablet by mouth daily., Disp: , Rfl:  .  buPROPion (WELLBUTRIN SR) 150 MG 12 hr tablet, Take 150 mg by mouth 2 (two) times daily. , Disp: , Rfl:  .  cyclobenzaprine (FLEXERIL) 10 MG tablet, TAKE 1 TABLET BY MOUTH TWICE A DAY AS NEEDED FOR MUSCLE SPASM, Disp: 60 tablet, Rfl: 1 .  FLUoxetine (PROZAC) 20 MG tablet, Take 60 mg by mouth daily. , Disp: , Rfl:  .  hydrochlorothiazide (MICROZIDE) 12.5 MG capsule, TAKE 1 CAPSULE BY MOUTH EVERY DAY, Disp: 90 capsule, Rfl: 1 .  meloxicam (MOBIC) 15 MG tablet, TAKE 1 TABLET BY MOUTH EVERY DAY, Disp: 90 tablet, Rfl: 3 .  Multiple Vitamins-Minerals (CENTRUM SILVER PO), Take 1 tablet daily by mouth., Disp: , Rfl:  .  pantoprazole (PROTONIX) 40 MG tablet, TAKE 1 TABLET BY MOUTH EVERY DAY, Disp: 90 tablet, Rfl: 1 .  sodium chloride (OCEAN) 0.65 % SOLN nasal spray, Place 2 sprays into both nostrils as needed for congestion., Disp: , Rfl:  .  tolterodine (DETROL LA) 4 MG 24 hr capsule, Take 4 mg by mouth every other day. , Disp: , Rfl: 6 .  triamcinolone cream (KENALOG) 0.1 %, APPLY TO AFFECTED AREA TWICE A DAY, Disp: 30 g, Rfl: 0  Allergies  Allergen Reactions  . Penicillins Anaphylaxis  . Sulfa Antibiotics Anaphylaxis    Objective:   LMP 12/11/2013   Patient is well-developed, well-nourished in no acute distress.  Resting  comfortably at home.  Head is normocephalic, atraumatic.  No labored breathing.  Speech is clear and coherent with logical content.  Patient is alert and oriented at baseline.   Assessment and Plan:   1. Viral syndrome Had 2 negative COVID tests (1 rapid and 1 send out). Discussed potential for false negative especially in case of mild symptoms or asymptomatic patients. Since she is asymptomatic other than evening fever will monitor for now. Start Vitamin C and Vitamin D and Zinc. Tylenol if needed. Monitor closely. Discussed need for Urgent care eval if not improving  or new symptoms develop since I cannot bring her in to office with intermittent fever.    Haley Martin, [place] 01/07/2020

## 2020-01-07 NOTE — Progress Notes (Signed)
I have discussed the procedure for the virtual visit with the patient who has given consent to proceed with assessment and treatment.   Rickelle Sylvestre S Anberlin Diez, CMA     

## 2020-01-07 NOTE — Patient Instructions (Signed)
Instructions sent to MyChart.   Please keep well-hydrated and get plenty of rest. Tylenol or Ibuprofen if needed for fever.  Start Vitamin D 1000 I/U Start Vitamin C 1000 mg twice daily Start Zinc supplement OTC.  If you note any new symptoms, fever in the day or symptoms not resolving, please let us know ASAP.     NEW:  [facility] at [facility] Directions [phone] [address] [place], [place] [address] . 10 am - 6pm Monday - Friday    [facility]) Get Driving Directions [phone] [address] [place], [place] [address] . 10 am to 8 pm Monday-Friday . 12 pm to 8 pm [facility] at [facility] Get Driving Directions [phone] [address] [place], [place] [address] . 8 am to 8 pm Monday-Friday . 9 am to 6 pm Saturday . 11 am to 6 pm Sunday     [facility] at [facility] Get Driving Directions  [phone] [address] [place], [place] [address] . 8 am to 8 pm Monday-Friday . 8 am to 4 pm [facility] Urgent Care at [facility] Directions [phone] [address] [place], [place] [address] . 12 pm to 6 pm Monday-Friday

## 2020-01-14 ENCOUNTER — Telehealth: Payer: Self-pay

## 2020-01-14 ENCOUNTER — Ambulatory Visit: Payer: PRIVATE HEALTH INSURANCE

## 2020-01-14 ENCOUNTER — Telehealth: Payer: Self-pay | Admitting: *Deleted

## 2020-01-14 NOTE — Telephone Encounter (Signed)
Patient advised of PCP recommendations. She is willing to go to [facility]. Patient main reason for the call is ask if its ok for patient to visit her father who is at home on [facility].  She is not currently running a fever, feeling fine. If she wears a mask. Is she able to visit him?

## 2020-01-14 NOTE — Telephone Encounter (Signed)
Pt stated symptoms not changing since last visit still with elevated tem 81F-102.80F Stated taking Ibuprofen helping with the temp. She is ok with more testing as stated last visit

## 2020-01-14 NOTE — Telephone Encounter (Signed)
I absolutely understand this predicament which is why it is important for her to have the evaluation today at the respiratory clinic so they can help determine if/when she can go see father.

## 2020-01-14 NOTE — Telephone Encounter (Signed)
See other message in chart

## 2020-01-14 NOTE — Telephone Encounter (Signed)
As discussed at last visit, she needs assessment either at urgent care or at our [facility] respiratory clinic for further evaluation as I cannot bring her into our office presently.

## 2020-01-14 NOTE — Telephone Encounter (Signed)
FYI:   Patient Name: Haley Martin Gender: Female DOB: 06/21/1966 Age: 53 Y 5 M 2 D Return Phone Number: [phone] (Primary) Address: City/State/Zip: [place] [address] Client [facility] Day - Cli Client Site [facility] - Day Physician Martin, Cody - [place] Contact Type Call Who Is Calling Patient / Member / Family / Caregiver Call Type Triage / Clinical Relationship To Patient Self Return Phone Number [phone] (Primary) Chief Complaint BREATHING - shortness of breath or sounds breathless Reason for Call Symptomatic / Request for Health Information Initial Comment Caller states she has had fever for past 10 days with shortness of breath when walking . Caller is requesting medication asap. Patients father is now on hospice care and she is unable to see him until fever is gone. Translation No Nurse Assessment Nurse: Lovelace, RN, Amy Date/Time (Eastern Time): 01/13/2020 8:54:12 AM Confirm and document reason for call. If symptomatic, describe symptoms. ---Caller states she has had fever for past 10 days with shortness of breath when walking . Caller is requesting medication asap. Patients father is now on hospice care, and she is unable to see him until her fever is gone. She is controlling the fever with medicine, alternating Tylenol & Ibuprofen. It had gotten up to 102.9 at one point. The SOB started the week of Christmas. Her O2 is 91% on a normal basis. It has gotten down to 84% once. Her BP is 140/82. Has the patient had close contact with a person known or suspected to have the novel coronavirus illness OR traveled / lives in area with major community spread (including international travel) in the last 14 days from the onset of symptoms? * If Asymptomatic, screen for exposure and travel within the last 14 days. ---Yes Does the patient have any new or worsening symptoms? ---Yes Will a triage be completed?  ---Yes Related visit to physician within the last 2 weeks? ---No Does the PT have any chronic conditions? (i.e. diabetes, asthma, this includes High risk factors for pregnancy, etc.) ---No Is this a behavioral health or substance abuse call? ---No PLEASE NOTE: All timestamps contained within this report are represented as Eastern Standard Time. CONFIDENTIALTY NOTICE: This fax transmission is intended only for the addressee. It contains information that is legally privileged, confidential or otherwise protected from use or disclosure. If you are not the intended recipient, you are strictly prohibited from reviewing, disclosing, copying using or disseminating any of this information or taking any action in reliance on or regarding this information. If you have received this fax in error, please notify us immediately by telephone so that we can arrange for its return to us. Phone: [phone], Toll-Free: [phone], Fax: [phone] Page: 2 of 2 Call Id: [id number] Guidelines Guideline Title Affirmed Question Affirmed Notes Nurse Date/Time (Eastern Time) Coronavirus (COVID-19) - Diagnosed or Suspected MILD difficulty breathing (e.g., minimal/no SOB at rest, SOB with walking, pulse <100) Lovelace, RN, Amy 01/13/2020 8:58:57 AM Disp. Time (Eastern Time) Disposition Final User 01/13/2020 8:52:43 AM Send to Urgent Queue Lockhart, Victoria 01/13/2020 9:04:33 AM Go to ED Now (or PCP triage) Yes Lovelace, RN, Amy Caller Disagree/Comply Disagree Caller Understands Yes PreDisposition InappropriateToAsk Care Advice Given Per Guideline GO TO ED NOW (OR PCP TRIAGE): * IF NO PCP (PRIMARY CARE PROVIDER) SECOND-LEVEL TRIAGE: You need to be seen within the next hour. Go to the ED/UCC at _____________ Hospital. Leave as soon as you can. CARE ADVICE given per CORONAVIRUS (COVID-19) - DIAGNOSED OR SUSPECTED (  Adult) guideline. Comments User: Amy, Lovelace, RN Date/Time (Eastern Time): 01/13/2020 9:08:15  AM Patient has been tested for COVID twice. Once the week before Christmas & on Jan. 18th. Both tests were negative. User: Amy, Lovelace, RN Date/Time (Eastern Time): 01/13/2020 9:09:21 AM Caller states she is not going to the ER, she will call her doctor tomorrow. She has gone this long, she can go one more day. She does not want to get COVID. She states if she gets worse today, she will call an ambulance & go to the ER. Referrals GO TO FACILITY REFUSED 

## 2020-01-14 NOTE — Telephone Encounter (Signed)
Advised patient of PCP recommendations. She is admit about going to see her father. He is vomiting, not eating. She doesn't want to not see him if he passes.

## 2020-01-14 NOTE — Telephone Encounter (Signed)
Typically it is no fever for 72 hours without medication and no other significant symptoms before you can come out of quarantine even with mask wearing. She will be able to get more detailed info after her evaluation.

## 2020-01-14 NOTE — Telephone Encounter (Signed)
Please call and follow-up with patient this morning. Get updated O2 saturation if possible. If still significant symptoms and/or O2 < 93 at rest or <90 walking she needs ER assessment. If O2 stable would offer her appointment at [facility] respiratory clinic.

## 2020-01-14 NOTE — Telephone Encounter (Signed)
Pt called and cancelled her appt at [facility]. She advised that her father's Hospice nurse called and he his expected to pass soon so she cannot make the appt today.

## 2020-01-14 NOTE — Telephone Encounter (Signed)
Please advise of additional testing With patient current symptoms, she is not able to come into the office.

## 2020-01-15 NOTE — Telephone Encounter (Signed)
Pt rescheduled visit at [facility] for 01/16/2020

## 2020-01-16 ENCOUNTER — Ambulatory Visit: Payer: PRIVATE HEALTH INSURANCE

## 2020-01-16 ENCOUNTER — Other Ambulatory Visit: Payer: Self-pay

## 2020-01-16 ENCOUNTER — Encounter: Payer: Self-pay | Admitting: Physician Assistant

## 2020-01-16 DIAGNOSIS — R0602 Shortness of breath: Secondary | ICD-10-CM

## 2020-01-17 NOTE — Telephone Encounter (Signed)
Also, pt states Selena Batten gave her a couple of therapist's names, she has lost this information and would like it again.

## 2020-01-17 NOTE — Telephone Encounter (Signed)
Spoke with patient. Advised of all information per PCP.

## 2020-01-17 NOTE — Telephone Encounter (Signed)
Pt did not go to [facility] 2[facility]. She said they reached out to her to come for an x-ray earlier, but she got confused and it was cold so she just did not go. Feels better so does not want to reschedule.  Fever free for 4 days per pt.  Would like Cody's opinion on going to see her father.

## 2020-01-17 NOTE — Telephone Encounter (Signed)
If symptoms are resolved and patient's father in palliative care at home (near death per her reports), she needs to mask but should be fine going to see him.   For counseling services call [phone] to set up appointment with [facility].

## 2020-01-17 NOTE — Telephone Encounter (Signed)
Please advise 

## 2020-01-17 NOTE — Telephone Encounter (Signed)
LMOVM and also sent MyChart message to patient.

## 2020-01-19 ENCOUNTER — Other Ambulatory Visit: Payer: Self-pay | Admitting: Physician Assistant

## 2020-01-19 DIAGNOSIS — M545 Low back pain, unspecified: Secondary | ICD-10-CM

## 2020-01-20 ENCOUNTER — Other Ambulatory Visit: Payer: Self-pay | Admitting: Physician Assistant

## 2020-01-21 ENCOUNTER — Telehealth: Payer: Self-pay | Admitting: Physician Assistant

## 2020-01-21 DIAGNOSIS — B001 Herpesviral vesicular dermatitis: Secondary | ICD-10-CM

## 2020-01-21 MED ORDER — VALACYCLOVIR HCL 1 G PO TABS: ORAL_TABLET | ORAL | 0 refills | Status: DC

## 2020-01-21 NOTE — Telephone Encounter (Signed)
I am so sorry to hear about this loss. My thoughts and prayers go out to her and her family during this hard time.  Ok to send in Valtrex 1000mg  tablet -- for cold sore it will be 2 tablets (2000 mg) every 12 hours for 1 day. Quant 4 with 0 Rf.

## 2020-01-21 NOTE — Telephone Encounter (Signed)
Pt called in stating that she has a cold sore on her lip,she would like to know if Haley Martin would call her in Valacyclovir to the [facility] [place]. Pt also wanted to let Haley Martin know that her father passed away yesterday. Pt can be reached at the home #

## 2020-01-21 NOTE — Telephone Encounter (Signed)
In reviewing patient chart, Valtrex was prescribed in 06/07/19 for possible shingles rash.

## 2020-01-21 NOTE — Telephone Encounter (Signed)
Left a detailed message on patient VM of rx for Valtrex sent to the pharmacy. Gave condolences on VM for loss of father.

## 2020-01-29 ENCOUNTER — Other Ambulatory Visit: Payer: Self-pay | Admitting: Physician Assistant

## 2020-01-29 DIAGNOSIS — B001 Herpesviral vesicular dermatitis: Secondary | ICD-10-CM

## 2020-01-29 MED ORDER — VALACYCLOVIR HCL 1 G PO TABS: ORAL_TABLET | ORAL | 0 refills | Status: DC

## 2020-01-29 NOTE — Telephone Encounter (Signed)
Patient calling back requesting another script for Valtrex. States that she has another cold sore forming.

## 2020-01-29 NOTE — Telephone Encounter (Signed)
MyChart message to patient.  

## 2020-01-29 NOTE — Telephone Encounter (Signed)
Rx refill.  If she continues to have frequent outbreaks we will need to start her on a once daily prophylactic dose of antiviral medication.

## 2020-02-04 ENCOUNTER — Ambulatory Visit: Payer: PRIVATE HEALTH INSURANCE | Admitting: Physician Assistant

## 2020-02-07 ENCOUNTER — Ambulatory Visit: Payer: PRIVATE HEALTH INSURANCE | Admitting: Physician Assistant

## 2020-02-12 ENCOUNTER — Telehealth: Payer: Self-pay | Admitting: Physician Assistant

## 2020-02-12 NOTE — Telephone Encounter (Signed)
Pt called in asking who Selena Batten recommends she see for night terrors. Pt can be reached at the home #. She states that Selena Batten has given her a list of providers before but she has misplaced it.

## 2020-02-12 NOTE — Telephone Encounter (Signed)
Unsure if any visit discussed any night terrors. Please advise. Patient has pending appointment Friday 02/15/20

## 2020-02-13 NOTE — Telephone Encounter (Signed)
I previously recommended [facility] for her depression and anxiety. That number is [phone] and she can call to schedule appointment. Regarding night terrors, this is new to me. We can discuss at her visit on Friday so we can potentially start medication and discuss potential referral to psychiatry.

## 2020-02-15 ENCOUNTER — Encounter: Payer: Self-pay | Admitting: Physician Assistant

## 2020-02-15 ENCOUNTER — Ambulatory Visit (INDEPENDENT_AMBULATORY_CARE_PROVIDER_SITE_OTHER): Payer: PRIVATE HEALTH INSURANCE | Admitting: Physician Assistant

## 2020-02-15 ENCOUNTER — Other Ambulatory Visit: Payer: Self-pay

## 2020-02-15 VITALS — BP 120/88 | HR 113 | Temp 98.7°F | Resp 14 | Ht 64.0 in | Wt 189.0 lb

## 2020-02-15 DIAGNOSIS — R221 Localized swelling, mass and lump, neck: Secondary | ICD-10-CM

## 2020-02-15 DIAGNOSIS — K222 Esophageal obstruction: Secondary | ICD-10-CM | POA: Diagnosis not present

## 2020-02-15 DIAGNOSIS — E010 Iodine-deficiency related diffuse (endemic) goiter: Secondary | ICD-10-CM | POA: Diagnosis not present

## 2020-02-15 DIAGNOSIS — F4321 Adjustment disorder with depressed mood: Secondary | ICD-10-CM | POA: Diagnosis not present

## 2020-02-15 LAB — CBC WITH DIFFERENTIAL/PLATELET
Basophils Absolute: 0.1 10*3/uL (ref 0.0–0.1)
Basophils Relative: 0.6 % (ref 0.0–3.0)
Eosinophils Absolute: 0.1 10*3/uL (ref 0.0–0.7)
Eosinophils Relative: 0.9 % (ref 0.0–5.0)
HCT: 45 % (ref 36.0–46.0)
Hemoglobin: 15.5 g/dL — ABNORMAL HIGH (ref 12.0–15.0)
Lymphocytes Relative: 33.8 % (ref 12.0–46.0)
Lymphs Abs: 3.3 10*3/uL (ref 0.7–4.0)
MCHC: 34.6 g/dL (ref 30.0–36.0)
MCV: 96 fl (ref 78.0–100.0)
Monocytes Absolute: 0.6 10*3/uL (ref 0.1–1.0)
Monocytes Relative: 6.3 % (ref 3.0–12.0)
Neutro Abs: 5.8 10*3/uL (ref 1.4–7.7)
Neutrophils Relative %: 58.4 % (ref 43.0–77.0)
Platelets: 248 10*3/uL (ref 150.0–400.0)
RBC: 4.68 Mil/uL (ref 3.87–5.11)
RDW: 16.2 % — ABNORMAL HIGH (ref 11.5–15.5)
WBC: 9.9 10*3/uL (ref 4.0–10.5)

## 2020-02-15 LAB — TSH: TSH: 2.08 u[IU]/mL (ref 0.35–4.50)

## 2020-02-15 NOTE — Patient Instructions (Signed)
Please go to the lab today for blood work.  I will call you with your results. We will alter treatment regimen(s) if indicated by your results.   You will be contacted for assessment with Gastroenterology due to your history of esophageal stricture and concern for recurrence.   Please use handout given to schedule appointment with counseling.  Continue medications as directed by psychiatry. I am very hopeful that the recent increase in your Pristiq will help with menopausal symptoms you are having. If not I want you to follow-up with your GYN.   You will be contacted for the [place] of your neck. I will call with your results.

## 2020-02-15 NOTE — Progress Notes (Signed)
Patient presents to clinic today to discuss multiple issues.  1-patient endorses prior history of mass of anterior neck requiring removal several years prior.  Notes this was a benign lymph node.  No record available for review.  States she has noticed a little area on her neck that has been present for couple of months.  States this feels similar to the lymph node that she had previously.  Would like this assessed today.  Patient also notes some fullness in her neck.  2-patient also endorsing a history of esophageal stricture, dilated several years ago.  Notes she has been having progressively worsening difficulty with swallowing, occasionally choking on solid foods and now liquids.  Denies any odynophagia.  Denies significant heartburn or indigestion with current regimen.  3-patient also having a very hard time with the recent death of her father.  Has had follow-up with her psychiatrist who recently increased her dose of Pristiq as well as mirtazapine to help with mood and sleep issues.  Is taking as directed.  Was previously given a list of counselors in the area.  She would like another copy of this list so that she can set up an appointment for grief counseling.  Denies suicidal thought or ideation.  Past Medical History:  Diagnosis Date  . Adult ADHD    managed by Dr. Evelene Croon  . Anxiety and depression    Managed by Dr. Evelene Croon.  Marland Kitchen BRVO (branch retinal vein occlusion) 2014   R eye 2014--followed by Dr. Luciana Axe.  MRI brain 11/19/15 showed mildly advanced generalized atrophy for age, +chronic small vessel ischemic changes, no acute finding.  . Depression   . Family history of colon cancer    11/2017 colonoscopy NORM.  Recall 5 yrs (Dr. Rhea Belton)  . Postmenopausal vaginal bleeding 05/2017   Pelvic u/s 06/2017 with thickened endometrium---referred to GYN for endometrial biopsy.  . Shingles 06/07/2019   L waistline   . Stroke [facility]) 2014   rt.eye    Current Outpatient Medications on File Prior to  Visit  Medication Sig Dispense Refill  . ALPRAZolam (XANAX) 1 MG tablet Take 1 mg by mouth 3 (three) times daily.    Marland Kitchen amphetamine-dextroamphetamine (ADDERALL) 20 MG tablet Take 20 mg by mouth 3 (three) times daily.    Marland Kitchen BIOTIN PO Take 1 tablet by mouth daily.    . cyclobenzaprine (FLEXERIL) 10 MG tablet TAKE 1 TABLET BY MOUTH TWICE A DAY AS NEEDED FOR MUSCLE SPASMS 60 tablet 1  . desvenlafaxine (PRISTIQ) 100 MG 24 hr tablet Take 100 mg by mouth every morning.    . hydrochlorothiazide (MICROZIDE) 12.5 MG capsule TAKE 1 CAPSULE BY MOUTH EVERY DAY 90 capsule 1  . meloxicam (MOBIC) 15 MG tablet TAKE 1 TABLET BY MOUTH EVERY DAY 90 tablet 3  . mirtazapine (REMERON) 15 MG tablet Take 15 mg by mouth at bedtime.    . Multiple Vitamins-Minerals (CENTRUM SILVER PO) Take 1 tablet daily by mouth.    . pantoprazole (PROTONIX) 40 MG tablet TAKE 1 TABLET BY MOUTH EVERY DAY 90 tablet 1  . sodium chloride (OCEAN) 0.65 % SOLN nasal spray Place 2 sprays into both nostrils as needed for congestion.    Marland Kitchen tolterodine (DETROL LA) 4 MG 24 hr capsule Take 4 mg by mouth every other day.   6  . triamcinolone cream (KENALOG) 0.1 % APPLY TO AFFECTED AREA TWICE A DAY 30 g 0  . valACYclovir (VALTREX) 1000 MG tablet Take 2 tablets by mouth every 12 hours  4 tablet 0   No current facility-administered medications on file prior to visit.    Allergies  Allergen Reactions  . Penicillins Anaphylaxis  . Sulfa Antibiotics Anaphylaxis    Family History  Problem Relation Age of Onset  . Hyperlipidemia Mother   . Thyroid disease Mother   . Colon polyps Mother   . Heart attack Mother   . Stroke Father   . Hypertension Father   . Hyperlipidemia Father   . Cancer Father        Lung  . Cancer Maternal Grandmother        colon cancer  . Healthy Sister   . Healthy Son   . Healthy Daughter   . Colon cancer Maternal Uncle   . Breast cancer Paternal Aunt   . Lung cancer Paternal Aunt   . Bone cancer Paternal Aunt   .  Colon cancer Maternal Uncle   . Esophageal cancer Neg Hx   . Rectal cancer Neg Hx   . Stomach cancer Neg Hx     Social History   Socioeconomic History  . Marital status: Divorced    Spouse name: Not on file  . Number of children: Not on file  . Years of education: Not on file  . Highest education level: Not on file  Occupational History  . Not on file  Tobacco Use  . Smoking status: Current Some Day Smoker    Packs/day: 0.20    Years: 2.00    Pack years: 0.40    Types: Cigarettes  . Smokeless tobacco: Never Used  Substance and Sexual Activity  . Alcohol use: Yes    Alcohol/week: 0.0 standard drinks    Comment: 1-3 beers a day  . Drug use: No  . Sexual activity: Not on file  Other Topics Concern  . Not on file  Social History Narrative   She works in the house cleaning business   She is divorced for 9 years   Has two children - daughter goes back and forth between.   Tob: 1 pack q 3d--current as of 02/2017.   Alc: 12 pack per week.   Social Determinants of Health   Financial Resource Strain:   . Difficulty of Paying Living Expenses: Not on file  Food Insecurity:   . Worried About Programme researcher, broadcasting/film/video in the Last Year: Not on file  . Ran Out of Food in the Last Year: Not on file  Transportation Needs:   . Lack of Transportation (Medical): Not on file  . Lack of Transportation (Non-Medical): Not on file  Physical Activity:   . Days of Exercise per Week: Not on file  . Minutes of Exercise per Session: Not on file  Stress:   . Feeling of Stress : Not on file  Social Connections:   . Frequency of Communication with Friends and Family: Not on file  . Frequency of Social Gatherings with Friends and Family: Not on file  . Attends Religious Services: Not on file  . Active Member of Clubs or Organizations: Not on file  . Attends Banker Meetings: Not on file  . Marital Status: Not on file   Review of Systems - See HPI.  All other ROS are negative.  Wt  189 lb (85.7 kg)   LMP 12/11/2013   BMI 32.44 kg/m   Physical Exam Vitals reviewed.  Constitutional:      Appearance: Normal appearance.  HENT:     Head: Normocephalic and atraumatic.  Right Ear: Tympanic membrane normal.     Left Ear: Tympanic membrane normal.  Eyes:     Conjunctiva/sclera: Conjunctivae normal.  Neck:     Thyroid: Thyromegaly present. No thyroid mass.   Cardiovascular:     Rate and Rhythm: Normal rate and regular rhythm.     Pulses: Normal pulses.     Heart sounds: Normal heart sounds.  Musculoskeletal:     Cervical back: Neck supple.  Lymphadenopathy:     Cervical: No cervical adenopathy.  Neurological:     Mental Status: She is alert.  Psychiatric:        Mood and Affect: Mood normal.     Assessment/Plan: 1. Thyromegaly 2. Neck fullness We will check labs today to include CBC and thyroid function.  Will obtain ultrasound of her thyroid due to thyromegaly and have them take a look at area of concern as well just to verify there is no small cyst present that I am not palpating on examination. - CBC w/Diff - TSH - US THYROID; Future  3. Esophageal stricture Referral to GI placed as patient is likely in need of repeat EGD and dilation of stricture present. - Ambulatory referral to Gastroenterology  4. Grief reaction Patient to continue management per specialist.  Handout on counseling/resources given to the patient so that she can set up an appointment.  This visit occurred during the SARS-CoV-2 public health emergency.  Safety protocols were in place, including screening questions prior to the visit, additional usage of staff PPE, and extensive cleaning of exam room while observing appropriate contact time as indicated for disinfecting solutions.      [name], PA-C

## 2020-02-15 NOTE — Telephone Encounter (Signed)
Discussing about other symptoms at her appointment

## 2020-02-16 ENCOUNTER — Other Ambulatory Visit: Payer: Self-pay | Admitting: Physician Assistant

## 2020-02-18 ENCOUNTER — Telehealth (INDEPENDENT_AMBULATORY_CARE_PROVIDER_SITE_OTHER): Payer: PRIVATE HEALTH INSURANCE | Admitting: Physician Assistant

## 2020-02-18 DIAGNOSIS — R350 Frequency of micturition: Secondary | ICD-10-CM

## 2020-02-18 NOTE — Telephone Encounter (Signed)
Pt called in asking if Haley Martin could call her in something to help control her bladder. Pt can be reached at the home #

## 2020-02-19 ENCOUNTER — Encounter: Payer: Self-pay | Admitting: Physician Assistant

## 2020-02-19 NOTE — Telephone Encounter (Signed)
Was not discussed at her visit. Please obtain more information so we can determine next steps -- urine testing versus Urology referral.

## 2020-02-20 LAB — POCT URINALYSIS DIPSTICK
Bilirubin, UA: NEGATIVE
Blood, UA: NEGATIVE
Glucose, UA: NEGATIVE
Ketones, UA: NEGATIVE
Leukocytes, UA: NEGATIVE
Nitrite, UA: NEGATIVE
Protein, UA: NEGATIVE
Spec Grav, UA: <=1.005 — AB (ref 1.010–1.025)
Urobilinogen, UA: 0.2 E.U./dL
pH, UA: 7.5 (ref 5.0–8.0)

## 2020-02-20 NOTE — Telephone Encounter (Signed)
Left a vm message for the patient to call our office back. 

## 2020-02-20 NOTE — Telephone Encounter (Signed)
I did not speak with patient regarding this. I see a urine was obtained by CMA at end of her visit but this was unremarkable. I do not know what issues she is having to be able to guide her further.

## 2020-02-20 NOTE — Telephone Encounter (Signed)
Patient left a urine sample the day of her appointment wanting a medication for urinary symptoms. She states she use to take a blue pill. Unsure of medication.

## 2020-02-22 ENCOUNTER — Other Ambulatory Visit: Payer: Self-pay | Admitting: Physician Assistant

## 2020-02-22 DIAGNOSIS — M545 Low back pain, unspecified: Secondary | ICD-10-CM

## 2020-02-26 NOTE — Telephone Encounter (Signed)
Tried to call patient to obtain more information but could not leave a message due to mailbox being full.

## 2020-02-27 ENCOUNTER — Ambulatory Visit (HOSPITAL_BASED_OUTPATIENT_CLINIC_OR_DEPARTMENT_OTHER): Payer: PRIVATE HEALTH INSURANCE

## 2020-02-28 NOTE — Telephone Encounter (Signed)
Will close note and wait for patient call back.

## 2020-02-29 ENCOUNTER — Ambulatory Visit: Payer: PRIVATE HEALTH INSURANCE | Admitting: Nurse Practitioner

## 2020-03-06 ENCOUNTER — Other Ambulatory Visit: Payer: Self-pay

## 2020-03-06 DIAGNOSIS — R221 Localized swelling, mass and lump, neck: Secondary | ICD-10-CM

## 2020-03-06 DIAGNOSIS — E010 Iodine-deficiency related diffuse (endemic) goiter: Secondary | ICD-10-CM

## 2020-03-10 ENCOUNTER — Other Ambulatory Visit: Payer: Self-pay

## 2020-03-10 ENCOUNTER — Encounter: Payer: PRIVATE HEALTH INSURANCE | Admitting: Physician Assistant

## 2020-03-10 ENCOUNTER — Ambulatory Visit (HOSPITAL_BASED_OUTPATIENT_CLINIC_OR_DEPARTMENT_OTHER)
Admission: RE | Admit: 2020-03-10 | Discharge: 2020-03-10 | Disposition: A | Discharge status: Home / Self Care | Payer: PRIVATE HEALTH INSURANCE | Source: Ambulatory Visit | Attending: Physician Assistant | Admitting: Physician Assistant

## 2020-03-10 DIAGNOSIS — E010 Iodine-deficiency related diffuse (endemic) goiter: Secondary | ICD-10-CM | POA: Insufficient documentation

## 2020-03-10 DIAGNOSIS — R221 Localized swelling, mass and lump, neck: Secondary | ICD-10-CM | POA: Diagnosis present

## 2020-03-16 NOTE — Progress Notes (Deleted)
     03/16/2020 Haley Martin [id number] [birth year]   Chief Complaint:  History of Present Illness: Haley Martin is a 54 year old female with a past medical history of  ADHD, depression,  ? Stroke left eye  Dysphagia.   ? Past EGD, esophageal stricture.   CBC Latest Ref Rng & Units 02/15/2020 03/05/2019 03/02/2019  WBC 4.0 - 10.5 K/uL 9.9 12.6(H) 14.0(H)  Hemoglobin 12.0 - 15.0 g/dL 15.5(H) 13.4 13.1  Hematocrit 36.0 - 46.0 % 45.0 40.3 37.8  Platelets 150.0 - 400.0 K/uL 248.0 246 199    CMP Latest Ref Rng & Units 03/02/2019 02/28/2019 12/27/2018  Glucose 70 - 99 mg/dL 882(C) 003(K) 92  BUN 6 - 20 mg/dL 11 11 14   Creatinine 0.44 - 1.00 mg/dL 9.17 9.15  Sodium 135 - 145 mmol/L 141 135 138  Potassium 3.5 - 5.1 mmol/L 3.8 3.7 3.7  Chloride 98 - 111 mmol/L 112(H) 102 100  CO2 22 - 32 mmol/L 23 25 29   Calcium 8.9 - 10.3 mg/dL 8.0(L) 9.0 9.8  Total Protein 6.5 - 8.1 g/dL - 7.6 7.2  Total Bilirubin 0.3 - 1.2 mg/dL - 0.56) 0.5  Alkaline Phos 38 - 126 U/L - 79 90  AST 15 - 41 U/L - 14(L) 20  ALT 0 - 44 U/L - 17 19   Colonoscopy 12/31/2016 by Dr. 9.7(X: - The digital rectal exam was normal. - The colon (entire examined portion) appeared normal. - Anal papilla(e) were hypertrophied on retroflexed views in the rectum. - 5 year recall due to family history of colon cancer.  [facility]  Past Medical History:  Diagnosis Date  . Adult ADHD    managed by Dr. 01/02/2017  . Anxiety and depression    Managed by Dr. Rhea Belton.  Evelene Croon BRVO (branch retinal vein occlusion) 2014   R eye 2014--followed by Dr. Evelene Croon.  MRI brain 11/19/15 showed mildly advanced generalized atrophy for age, +chronic small vessel ischemic changes, no acute finding.  . Depression   . Family history of colon cancer    11/2017 colonoscopy NORM.  Recall 5 yrs (Dr. 14/7/16)  . Postmenopausal vaginal bleeding 05/2017   Pelvic u/s 06/2017 with thickened endometrium---referred to GYN for endometrial biopsy.  . Shingles  06/07/2019   L waistline   . Stroke [facility]) 2014   rt.eye      Current Medications, Allergies, Past Medical History, Past Surgical History, Family History and Social History were reviewed in [facility] record.   Physical Exam: LMP 12/11/2013  General: Well developed, w   ***female in no acute distress. Head: Normocephalic and atraumatic. Eyes: No scleral icterus. Conjunctiva pink . Ears: Normal auditory acuity. Mouth: Dentition intact. No ulcers or lesions.  Lungs: Clear throughout to auscultation. Heart: Regular rate and rhythm, no murmur. Abdomen: Soft, nontender and nondistended. No masses or hepatomegaly. Normal bowel sounds x 4 quadrants.  Rectal: *** Musculoskeletal: Symmetrical with no gross deformities. Extremities: No edema. Neurological: Alert oriented x 4. No focal deficits.  Psychological: Alert and cooperative. Normal mood and affect  Assessment and Recommendations: ***

## 2020-03-17 ENCOUNTER — Other Ambulatory Visit: Payer: Self-pay

## 2020-03-17 ENCOUNTER — Ambulatory Visit: Payer: PRIVATE HEALTH INSURANCE | Admitting: Nurse Practitioner

## 2020-03-17 ENCOUNTER — Telehealth: Payer: Self-pay

## 2020-03-17 ENCOUNTER — Encounter: Payer: PRIVATE HEALTH INSURANCE | Admitting: Physician Assistant

## 2020-03-17 DIAGNOSIS — G319 Degenerative disease of nervous system, unspecified: Secondary | ICD-10-CM

## 2020-03-17 NOTE — Telephone Encounter (Signed)
Spoke with patient per PCP recommendations. Patient voiced understanding and states she will discuss the overactive bladder at her next appointment. Referral for neuro placed.

## 2020-03-17 NOTE — Telephone Encounter (Signed)
Patient states that she was told by another provider back in 2016 that she had cerebral atrophy that was a little more progressed for her age. Patient does not remember who told her but would like to know your thoughts on her seeing neurology. She states she would like to see them before she starts her new job next week. 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

## 2020-03-17 NOTE — Telephone Encounter (Signed)
I think it is reasonable for her to follow with neurology. Ok to place referral.  I do not prescribe medication to prevent urination just due to work duties -- If there is an issue with signifcant urinary urgency, frequency, etc she would need assessment for overactive bladder first.

## 2020-03-18 ENCOUNTER — Encounter: Payer: Self-pay | Admitting: Neurology

## 2020-03-19 ENCOUNTER — Other Ambulatory Visit: Payer: Self-pay | Admitting: Physician Assistant

## 2020-03-21 ENCOUNTER — Telehealth: Payer: Self-pay | Admitting: Physician Assistant

## 2020-03-21 ENCOUNTER — Encounter: Payer: PRIVATE HEALTH INSURANCE | Admitting: Physician Assistant

## 2020-03-21 NOTE — Telephone Encounter (Signed)
Pt has a physical scheduled for 3:30 on 4/16 - she would like to come in on the morning of the 15th to do her labs if possible.  Please call pt and advise her if this is ok.  If it is ok, pt will need to be put on the lab schedule.

## 2020-03-21 NOTE — Telephone Encounter (Signed)
Ok for labs prior to appointment?

## 2020-03-23 NOTE — Telephone Encounter (Signed)
Do not do labs beforehand due to some insurances not paying in the past and in case she brings up other issues requiring additional testing beyond baseline CPE labs.

## 2020-03-24 NOTE — Telephone Encounter (Signed)
Left detailed message on patient's voicemail with PCP recommendations and informed patient to call the office back with any other questions regarding the matter.

## 2020-03-25 ENCOUNTER — Other Ambulatory Visit: Payer: Self-pay | Admitting: Family Medicine

## 2020-03-25 DIAGNOSIS — M7541 Impingement syndrome of right shoulder: Secondary | ICD-10-CM

## 2020-03-25 DIAGNOSIS — M545 Low back pain, unspecified: Secondary | ICD-10-CM

## 2020-03-27 ENCOUNTER — Ambulatory Visit: Payer: PRIVATE HEALTH INSURANCE | Attending: Internal Medicine

## 2020-03-27 ENCOUNTER — Ambulatory Visit: Payer: PRIVATE HEALTH INSURANCE | Admitting: Professional

## 2020-03-27 DIAGNOSIS — Z23 Encounter for immunization: Secondary | ICD-10-CM

## 2020-03-27 NOTE — Progress Notes (Signed)
   Covid-19 Vaccination Clinic  Name:  Haley Martin    MRN: [id number] DOB: [birth year]  03/27/2020  [name] was observed post Covid-19 immunization for 30 minutes based on pre-vaccination screening without incident. She was provided with Vaccine Information Sheet and instruction to access the V-Safe system.   [name] was instructed to call 911 with any severe reactions post vaccine: Marland Kitchen Difficulty breathing  . Swelling of face and throat  . A fast heartbeat  . A bad rash all over body  . Dizziness and weakness   Immunizations Administered    Name Date Dose VIS Date Route   Pfizer COVID-19 Vaccine 03/27/2020 12:21 PM 0.3 mL 11/23/2019 Intramuscular   Manufacturer: [employer], [employer]   Lot: [id number]   NDC: [address]-9

## 2020-03-28 ENCOUNTER — Encounter: Payer: PRIVATE HEALTH INSURANCE | Admitting: Physician Assistant

## 2020-04-07 ENCOUNTER — Encounter: Payer: PRIVATE HEALTH INSURANCE | Admitting: Physician Assistant

## 2020-04-07 DIAGNOSIS — Z0289 Encounter for other administrative examinations: Secondary | ICD-10-CM

## 2020-04-17 ENCOUNTER — Other Ambulatory Visit: Payer: Self-pay

## 2020-04-17 ENCOUNTER — Ambulatory Visit (INDEPENDENT_AMBULATORY_CARE_PROVIDER_SITE_OTHER): Payer: PRIVATE HEALTH INSURANCE | Admitting: Physician Assistant

## 2020-04-17 ENCOUNTER — Encounter: Payer: Self-pay | Admitting: Physician Assistant

## 2020-04-17 VITALS — BP 110/62 | HR 93 | Temp 98.3°F | Resp 18 | Ht 64.25 in | Wt 187.2 lb

## 2020-04-17 DIAGNOSIS — M545 Low back pain, unspecified: Secondary | ICD-10-CM

## 2020-04-17 DIAGNOSIS — Z0000 Encounter for general adult medical examination without abnormal findings: Secondary | ICD-10-CM

## 2020-04-17 DIAGNOSIS — Z0001 Encounter for general adult medical examination with abnormal findings: Secondary | ICD-10-CM | POA: Diagnosis not present

## 2020-04-17 DIAGNOSIS — Z1231 Encounter for screening mammogram for malignant neoplasm of breast: Secondary | ICD-10-CM

## 2020-04-17 DIAGNOSIS — N3281 Overactive bladder: Secondary | ICD-10-CM

## 2020-04-17 DIAGNOSIS — F39 Unspecified mood [affective] disorder: Secondary | ICD-10-CM | POA: Diagnosis not present

## 2020-04-17 DIAGNOSIS — I10 Essential (primary) hypertension: Secondary | ICD-10-CM | POA: Diagnosis not present

## 2020-04-17 LAB — LIPID PANEL
Cholesterol: 167 mg/dL (ref 0–200)
HDL: 48 mg/dL (ref >39.00)
LDL Cholesterol: 83 mg/dL (ref 0–99)
NonHDL: 119.38
Total CHOL/HDL Ratio: 3
Triglycerides: 183 mg/dL — ABNORMAL HIGH (ref 0.0–149.0)
VLDL: 36.6 mg/dL (ref 0.0–40.0)

## 2020-04-17 LAB — COMPREHENSIVE METABOLIC PANEL
ALT: 26 U/L (ref 0–35)
AST: 25 U/L (ref 0–37)
Albumin: 4.2 g/dL (ref 3.5–5.2)
Alkaline Phosphatase: 122 U/L — ABNORMAL HIGH (ref 39–117)
BUN: 13 mg/dL (ref 6–23)
CO2: 29 mEq/L (ref 19–32)
Calcium: 8.8 mg/dL (ref 8.4–10.5)
Chloride: 101 mEq/L (ref 96–112)
Creatinine, Ser: 0.63 mg/dL (ref 0.40–1.20)
GFR: 97.86 mL/min (ref >60.00)
Glucose, Bld: 169 mg/dL — ABNORMAL HIGH (ref 70–99)
Potassium: 3.6 mEq/L (ref 3.5–5.1)
Sodium: 136 mEq/L (ref 135–145)
Total Bilirubin: 0.6 mg/dL (ref 0.2–1.2)
Total Protein: 7.1 g/dL (ref 6.0–8.3)

## 2020-04-17 LAB — CBC WITH DIFFERENTIAL/PLATELET
Basophils Absolute: 0.1 10*3/uL (ref 0.0–0.1)
Basophils Relative: 0.7 % (ref 0.0–3.0)
Eosinophils Absolute: 0.2 10*3/uL (ref 0.0–0.7)
Eosinophils Relative: 2.5 % (ref 0.0–5.0)
HCT: 41.9 % (ref 36.0–46.0)
Hemoglobin: 14.7 g/dL (ref 12.0–15.0)
Lymphocytes Relative: 34 % (ref 12.0–46.0)
Lymphs Abs: 2.5 10*3/uL (ref 0.7–4.0)
MCHC: 35.1 g/dL (ref 30.0–36.0)
MCV: 94.5 fl (ref 78.0–100.0)
Monocytes Absolute: 0.5 10*3/uL (ref 0.1–1.0)
Monocytes Relative: 6.6 % (ref 3.0–12.0)
Neutro Abs: 4.2 10*3/uL (ref 1.4–7.7)
Neutrophils Relative %: 56.2 % (ref 43.0–77.0)
Platelets: 231 10*3/uL (ref 150.0–400.0)
RBC: 4.43 Mil/uL (ref 3.87–5.11)
RDW: 13.4 % (ref 11.5–15.5)
WBC: 7.5 10*3/uL (ref 4.0–10.5)

## 2020-04-17 LAB — TSH: TSH: 1.31 u[IU]/mL (ref 0.35–4.50)

## 2020-04-17 LAB — HEMOGLOBIN A1C: Hgb A1c MFr Bld: 5.5 % (ref 4.6–6.5)

## 2020-04-17 LAB — VITAMIN D 25 HYDROXY (VIT D DEFICIENCY, FRACTURES): VITD: 42.32 ng/mL (ref 30.00–100.00)

## 2020-04-17 MED ORDER — MELOXICAM 15 MG PO TABS: 15.0000 mg | ORAL_TABLET | Freq: Every day | ORAL | 0 refills | Status: DC

## 2020-04-17 MED ORDER — TOLTERODINE TARTRATE ER 2 MG PO CP24: 2.0000 mg | ORAL_CAPSULE | Freq: Every day | ORAL | 1 refills | Status: DC

## 2020-04-17 MED ORDER — HYDROCHLOROTHIAZIDE 12.5 MG PO CAPS: ORAL_CAPSULE | ORAL | 1 refills | Status: DC

## 2020-04-17 NOTE — Patient Instructions (Signed)
Please go to the lab for blood work.   Our office will call you with your results unless you have chosen to receive results via MyChart.  If your blood work is normal we will follow-up each year for physicals and as scheduled for chronic medical problems.  If anything is abnormal we will treat accordingly and get you in for a follow-up.  You will be contacted to schedule your Mammogram.  Please schedule an appointment for routine pelvic exam and pap smear.  Follow-up with me in 1 month for reassessment of the overactive bladder.   Preventive Care 56-15 Years Old, Female Preventive care refers to visits with your health care provider and lifestyle choices that can promote health and wellness. This includes:  A yearly physical exam. This may also be called an annual well check.  Regular dental visits and eye exams.  Immunizations.  Screening for certain conditions.  Healthy lifestyle choices, such as eating a healthy diet, getting regular exercise, not using drugs or products that contain nicotine and tobacco, and limiting alcohol use. What can I expect for my preventive care visit? Physical exam Your health care provider will check your:  Height and weight. This may be used to calculate body mass index (BMI), which tells if you are at a healthy weight.  Heart rate and blood pressure.  Skin for abnormal spots. Counseling Your health care provider may ask you questions about your:  Alcohol, tobacco, and drug use.  Emotional well-being.  Home and relationship well-being.  Sexual activity.  Eating habits.  Work and work Statistician.  Method of birth control.  Menstrual cycle.  Pregnancy history. What immunizations do I need?  Influenza (flu) vaccine  This is recommended every year. Tetanus, diphtheria, and pertussis (Tdap) vaccine  You may need a Td booster every 10 years. Varicella (chickenpox) vaccine  You may need this if you have not been  vaccinated. Zoster (shingles) vaccine  You may need this after age 15. Measles, mumps, and rubella (MMR) vaccine  You may need at least one dose of MMR if you were born in 1957 or later. You may also need a second dose. Pneumococcal conjugate (PCV13) vaccine  You may need this if you have certain conditions and were not previously vaccinated. Pneumococcal polysaccharide (PPSV23) vaccine  You may need one or two doses if you smoke cigarettes or if you have certain conditions. Meningococcal conjugate (MenACWY) vaccine  You may need this if you have certain conditions. Hepatitis A vaccine  You may need this if you have certain conditions or if you travel or work in places where you may be exposed to hepatitis A. Hepatitis B vaccine  You may need this if you have certain conditions or if you travel or work in places where you may be exposed to hepatitis B. Haemophilus influenzae type b (Hib) vaccine  You may need this if you have certain conditions. Human papillomavirus (HPV) vaccine  If recommended by your health care provider, you may need three doses over 6 months. You may receive vaccines as individual doses or as more than one vaccine together in one shot (combination vaccines). Talk with your health care provider about the risks and benefits of combination vaccines. What tests do I need? Blood tests  Lipid and cholesterol levels. These may be checked every 5 years, or more frequently if you are over 50 years old.  Hepatitis C test.  Hepatitis B test. Screening  Lung cancer screening. You may have this screening every year  starting at age 56 if you have a 30-pack-year history of smoking and currently smoke or have quit within the past 15 years.  Colorectal cancer screening. All adults should have this screening starting at age 56 and continuing until age 56. Your health care provider may recommend screening at age 56 if you are at increased risk. You will have tests every  1-10 years, depending on your results and the type of screening test.  Diabetes screening. This is done by checking your blood sugar (glucose) after you have not eaten for a while (fasting). You may have this done every 1-3 years.  Mammogram. This may be done every 1-2 years. Talk with your health care provider about when you should start having regular mammograms. This may depend on whether you have a family history of breast cancer.  BRCA-related cancer screening. This may be done if you have a family history of breast, ovarian, tubal, or peritoneal cancers.  Pelvic exam and Pap test. This may be done every 3 years starting at age 56. Starting at age 56, this may be done every 5 years if you have a Pap test in combination with an HPV test. Other tests  Sexually transmitted disease (STD) testing.  Bone density scan. This is done to screen for osteoporosis. You may have this scan if you are at high risk for osteoporosis. Follow these instructions at home: Eating and drinking  Eat a diet that includes fresh fruits and vegetables, whole grains, lean protein, and low-fat dairy.  Take vitamin and mineral supplements as recommended by your health care provider.  Do not drink alcohol if: ? Your health care provider tells you not to drink. ? You are pregnant, may be pregnant, or are planning to become pregnant.  If you drink alcohol: ? Limit how much you have to 0-1 drink a day. ? Be aware of how much alcohol is in your drink. In the [place] one drink equals one 12 oz bottle of beer (355 mL), one 5 oz glass of wine (148 mL), or one 1 oz glass of hard liquor (44 mL). Lifestyle  Take daily care of your teeth and gums.  Stay active. Exercise for at least 30 minutes on 5 or more days each week.  Do not use any products that contain nicotine or tobacco, such as cigarettes, e-cigarettes, and chewing tobacco. If you need help quitting, ask your health care provider.  If you are sexually active,  practice safe sex. Use a condom or other form of birth control (contraception) in order to prevent pregnancy and STIs (sexually transmitted infections).  If told by your health care provider, take low-dose aspirin daily starting at age 56. What's next?  Visit your health care provider once a year for a well check visit.  Ask your health care provider how often you should have your eyes and teeth checked.  Stay up to date on all vaccines. This information is not intended to replace advice given to you by your health care provider. Make sure you discuss any questions you have with your health care provider. Document Revised: 08/10/2018 Document Reviewed: 08/10/2018 Elsevier Patient Education  2020 [employer].

## 2020-04-17 NOTE — Progress Notes (Signed)
Patient presents to clinic today for annual exam.  Patient is fasting for labs.  Diet --patient endorses she has been working very hard on her diet.  Is following a dietary program where she has to low-fat meals per day and 1 meal replacement shake with protein.  Notes she feels much better since starting this regimen.  Has lost weight per her scale at home.  Checks weight about once a week.  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  Also walks on her time off.  Patient also endorses issue with stress incontinence, having urinary leakage with coughing sneezing.  This is associated with a constant increase in urinary frequency.  Has been an issue for several years.  Was previously on Detrol LA with good result.  Denies dysuria, hematuria or urge incontinence.  Health Maintenance: Immunizations --up-to-date  Colonoscopy --up-to-date  Mammogram --due.  Patient agrees to mammogram.  Asymptomatic  PAP --overdue.  Patient would like to have done here but would like to reschedule for a different day.  Appointment scheduled.   Past Medical History:  Diagnosis Date  . Adult ADHD    managed by Dr. Toy Care  . Anxiety and depression    Managed by Dr. Toy Care.  Marland Kitchen BRVO (branch retinal vein occlusion) 2014   R eye 2014--followed by Dr. Zadie Rhine.  MRI brain 11/19/15 showed mildly advanced generalized atrophy for age, +chronic small vessel ischemic changes, no acute finding.  . Depression   . Family history of colon cancer    11/2017 colonoscopy NORM.  Recall 5 yrs (Dr. Hilarie Fredrickson)  . Postmenopausal vaginal bleeding 05/2017   Pelvic u/s 06/2017 with thickened endometrium---referred to GYN for endometrial biopsy.  . Shingles 06/07/2019   L waistline   . Stroke [facility]) 2014   rt.eye    Past Surgical History:  Procedure Laterality Date  . APPENDECTOMY  02/28/2019  . BREAST ENHANCEMENT SURGERY    . Carotid dopplers  09/19/2015   NORMAL  . chin implant    . COLONOSCOPY  11/30/2017   NORMAL: recall 5 yrs ([facility])  . FINGER SURGERY     cyst  . LAPAROSCOPIC APPENDECTOMY N/A 03/01/2019   Procedure: APPENDECTOMY LAPAROSCOPIC WITH DRAIN PLACEMENT;  Surgeon: Armandina Gemma, MD;  Location: [facility] ORS;  Service: General;  Laterality: N/A;  . NECK SURGERY  REMOTE past   lymph node excision--benign per pt report  . SALIVARY GLAND SURGERY     stone   . TMJ ARTHROPLASTY      Current Outpatient Medications on File Prior to Visit  Medication Sig Dispense Refill  . ALPRAZolam (XANAX) 1 MG tablet Take 1 mg by mouth 3 (three) times daily.    Marland Kitchen amphetamine-dextroamphetamine (ADDERALL) 20 MG tablet Take 20 mg by mouth 3 (three) times daily.    Marland Kitchen BIOTIN PO Take 1 tablet by mouth daily.    . cyclobenzaprine (FLEXERIL) 10 MG tablet TAKE 1 TABLET BY MOUTH TWICE A DAY AS NEEDED FOR MUSCLE SPASM 60 tablet 1  . desvenlafaxine (PRISTIQ) 100 MG 24 hr tablet Take 100 mg by mouth every morning.    . hydrochlorothiazide (MICROZIDE) 12.5 MG capsule TAKE 1 CAPSULE BY MOUTH EVERY DAY 90 capsule 1  . meloxicam (MOBIC) 15 MG tablet TAKE 1 TABLET BY MOUTH EVERY DAY 90 tablet 3  . mirtazapine (REMERON) 30 MG tablet Take 30 mg by mouth at bedtime.    . Multiple Vitamins-Minerals (CENTRUM SILVER PO) Take 1 tablet daily by mouth.    . pantoprazole (  PROTONIX) 40 MG tablet TAKE 1 TABLET BY MOUTH EVERY DAY 90 tablet 1  . valACYclovir (VALTREX) 1000 MG tablet Take 2 tablets by mouth every 12 hours 4 tablet 0  . sodium chloride (OCEAN) 0.65 % SOLN nasal spray Place 2 sprays into both nostrils as needed for congestion.    Marland Kitchen tolterodine (DETROL LA) 4 MG 24 hr capsule Take 4 mg by mouth every other day.   6  . triamcinolone cream (KENALOG) 0.1 % APPLY TO AFFECTED AREA TWICE A DAY 30 g 0   No current facility-administered medications on file prior to visit.    Allergies  Allergen Reactions  . Penicillins Anaphylaxis  . Sulfa Antibiotics Anaphylaxis    Family History  Problem Relation Age of Onset  .  Hyperlipidemia Mother   . Thyroid disease Mother   . Colon polyps Mother   . Heart attack Mother   . Stroke Father   . Hypertension Father   . Hyperlipidemia Father   . Cancer Father        Lung  . Cancer Maternal Grandmother        colon cancer  . Healthy Sister   . Healthy Son   . Healthy Daughter   . Colon cancer Maternal Uncle   . Breast cancer Paternal Aunt   . Lung cancer Paternal Aunt   . Bone cancer Paternal Aunt   . Colon cancer Maternal Uncle   . Esophageal cancer Neg Hx   . Rectal cancer Neg Hx   . Stomach cancer Neg Hx     Social History   Socioeconomic History  . Marital status: Divorced    Spouse name: Not on file  . Number of children: Not on file  . Years of education: Not on file  . Highest education level: Not on file  Occupational History  . Not on file  Tobacco Use  . Smoking status: Current Some Day Smoker    Packs/day: 0.20    Years: 2.00    Pack years: 0.40    Types: Cigarettes  . Smokeless tobacco: Never Used  Substance and Sexual Activity  . Alcohol use: Yes    Alcohol/week: 0.0 standard drinks    Comment: 1-3 beers a day  . Drug use: No  . Sexual activity: Not on file  Other Topics Concern  . Not on file  Social History Narrative   She works in the house cleaning business   She is divorced for 9 years   Has two children - daughter goes back and forth between.   Tob: 1 pack q 3d--current as of 02/2017.   Alc: 12 pack per week.   Social Determinants of Health   Financial Resource Strain:   . Difficulty of Paying Living Expenses:   Food Insecurity:   . Worried About Programme researcher, broadcasting/film/video in the Last Year:   . Barista in the Last Year:   Transportation Needs:   . Freight forwarder (Medical):   Marland Kitchen Lack of Transportation (Non-Medical):   Physical Activity:   . Days of Exercise per Week:   . Minutes of Exercise per Session:   Stress:   . Feeling of Stress :   Social Connections:   . Frequency of Communication with  Friends and Family:   . Frequency of Social Gatherings with Friends and Family:   . Attends Religious Services:   . Active Member of Clubs or Organizations:   . Attends Banker Meetings:   .  Marital Status:   Intimate Partner Violence:   . Fear of Current or Ex-Partner:   . Emotionally Abused:   Marland Kitchen Physically Abused:   . Sexually Abused:    Review of Systems  Constitutional: Negative for fever and weight loss.  HENT: Negative for ear discharge, ear pain, hearing loss and tinnitus.   Eyes: Negative for blurred vision, double vision, photophobia and pain.  Respiratory: Negative for cough and shortness of breath.   Cardiovascular: Negative for chest pain and palpitations.  Gastrointestinal: Negative for abdominal pain, blood in stool, constipation, diarrhea, heartburn, melena, nausea and vomiting.  Genitourinary: Positive for frequency. Negative for dysuria, flank pain, hematuria and urgency.       Urinary leakage with coughing, sneezing.  Musculoskeletal: Negative for falls.  Neurological: Negative for dizziness, loss of consciousness and headaches.  Endo/Heme/Allergies: Negative for environmental allergies.  Psychiatric/Behavioral: Negative for depression, hallucinations, substance abuse and suicidal ideas. The patient is not nervous/anxious and does not have insomnia.    Ht 5' 4.25" (1.632 m)   Wt 187 lb 3.2 oz (84.9 kg)   LMP 12/11/2013   BMI 31.88 kg/m   Physical Exam Vitals reviewed.  Constitutional:      Appearance: Normal appearance.  HENT:     Head: Normocephalic and atraumatic.     Right Ear: Tympanic membrane, ear canal and external ear normal.     Left Ear: Tympanic membrane, ear canal and external ear normal.     Nose: Nose normal. No mucosal edema.     Mouth/Throat:     Pharynx: Uvula midline. No oropharyngeal exudate or posterior oropharyngeal erythema.  Eyes:     Conjunctiva/sclera: Conjunctivae normal.     Pupils: Pupils are equal, round, and  reactive to light.  Neck:     Thyroid: No thyromegaly.  Cardiovascular:     Rate and Rhythm: Normal rate and regular rhythm.     Heart sounds: Normal heart sounds.  Pulmonary:     Effort: Pulmonary effort is normal. No respiratory distress.     Breath sounds: Normal breath sounds. No wheezing or rales.  Abdominal:     General: Bowel sounds are normal. There is no distension.     Palpations: Abdomen is soft. There is no mass.     Tenderness: There is no abdominal tenderness. There is no guarding or rebound.  Musculoskeletal:     Cervical back: Neck supple.  Lymphadenopathy:     Cervical: No cervical adenopathy.  Skin:    General: Skin is warm and dry.     Findings: No rash.  Neurological:     Mental Status: She is alert and oriented to person, place, and time.     Cranial Nerves: No cranial nerve deficit.     Recent Results (from the past 2160 hour(s))  CBC w/Diff     Status: Abnormal   Collection Time: 02/15/20 12:16 PM  Result Value Ref Range   WBC 9.9 4.0 - 10.5 K/uL   RBC 4.68 3.87 - 5.11 Mil/uL   Hemoglobin 15.5 (H) 12.0 - 15.0 g/dL   HCT 56.3 14.9 - 70.2 %   MCV 96.0 78.0 - 100.0 fl   MCHC 34.6 30.0 - 36.0 g/dL   RDW 63.7 (H) 85.8 - 85.0 %   Platelets 248.0 150.0 - 400.0 K/uL   Neutrophils Relative % 58.4 43.0 - 77.0 %   Lymphocytes Relative 33.8 12.0 - 46.0 %   Monocytes Relative 6.3 3.0 - 12.0 %   Eosinophils Relative 0.9 0.0 -  5.0 %   Basophils Relative 0.6 0.0 - 3.0 %   Neutro Abs 5.8 1.4 - 7.7 K/uL   Lymphs Abs 3.3 0.7 - 4.0 K/uL   Monocytes Absolute 0.6 0.1 - 1.0 K/uL   Eosinophils Absolute 0.1 0.0 - 0.7 K/uL   Basophils Absolute 0.1 0.0 - 0.1 K/uL  TSH     Status: None   Collection Time: 02/15/20 12:16 PM  Result Value Ref Range   TSH 2.08 0.35 - 4.50 uIU/mL  POCT urinalysis dipstick     Status: Abnormal   Collection Time: 02/20/20  9:44 AM  Result Value Ref Range   Color, UA straw    Clarity, UA clear    Glucose, UA Negative Negative   Bilirubin,  UA negative    Ketones, UA negative    Spec Grav, UA <=1.005 (A) 1.010 - 1.025   Blood, UA negative    pH, UA 7.5 5.0 - 8.0   Protein, UA Negative Negative   Urobilinogen, UA 0.2 0.2 or 1.0 E.U./dL   Nitrite, UA negative    Leukocytes, UA Negative Negative   Appearance     Odor      Assessment/Plan: 1. Visit for preventive health examination Depression screen negative. Health Maintenance reviewed --patient to return for routine pelvic exam and Pap smear. Preventive schedule discussed and handout given in AVS. Will obtain fasting labs today. - CBC with Differential/Platelet - Comprehensive metabolic panel - Lipid panel - Hemoglobin A1c - TSH - Vitamin D (25 hydroxy)  2. Encounter for screening mammogram for malignant neoplasm of breast Asymptomatic.  Average risk.  We will proceed with screening mammogram. - MM DIGITAL SCREENING BILATERAL; Future  3. Essential hypertension Normotensive.  Asymptomatic.  Continue working on diet and exercise.  Repeat fasting labs today. - Comprehensive metabolic panel - Lipid panel - Hemoglobin A1c - TSH  4. Mood disorder (HCC) Followed by psychiatry.  Doing very well overall with current regimen.  Some fatigue.  Will recheck vitamin D level today. - Vitamin D (25 hydroxy)  5. OAB (overactive bladder) In the absence of symptoms for infection.  Longstanding history.  Previously on Detrol LA with good results.  Will restart at 2 mg once daily dose.  Discussed good hydration and avoidance of bladder irritants such as caffeine and certain herbal teas (dandelion, etc) - tolterodine (DETROL LA) 2 MG 24 hr capsule; Take 1 capsule (2 mg total) by mouth daily.  Dispense: 30 capsule; Refill: 1  6. Lumbar pain Chronic.  Stable.  Meloxicam as needed.  Medication refilled. - meloxicam (MOBIC) 15 MG tablet; Take 1 tablet (15 mg total) by mouth daily.  Dispense: 90 tablet; Refill: 0  This visit occurred during the SARS-CoV-2 public health emergency.   Safety protocols were in place, including screening questions prior to the visit, additional usage of staff PPE, and extensive cleaning of exam room while observing appropriate contact time as indicated for disinfecting solutions.     Piedad Climes, PA-C

## 2020-04-22 ENCOUNTER — Ambulatory Visit: Payer: Self-pay

## 2020-04-22 ENCOUNTER — Ambulatory Visit: Payer: PRIVATE HEALTH INSURANCE | Attending: Internal Medicine

## 2020-05-10 ENCOUNTER — Ambulatory Visit: Payer: PRIVATE HEALTH INSURANCE | Attending: Internal Medicine

## 2020-05-10 DIAGNOSIS — Z23 Encounter for immunization: Secondary | ICD-10-CM

## 2020-05-10 NOTE — Progress Notes (Signed)
   Covid-19 Vaccination Clinic  Name:  Haley Martin    MRN: [id number] DOB: [birth year]  05/10/2020  Haley Martin was observed post Covid-19 immunization for 15 minutes without incident. She was provided with Vaccine Information Sheet and instruction to access the V-Safe system.   Haley Martin was instructed to call 911 with any severe reactions post vaccine: Marland Kitchen Difficulty breathing  . Swelling of face and throat  . A fast heartbeat  . A bad rash all over body  . Dizziness and weakness   Immunizations Administered    Name Date Dose VIS Date Route   Pfizer COVID-19 Vaccine 05/10/2020 11:05 AM 0.3 mL 02/06/2019 Intramuscular   Manufacturer: [employer], [employer]   Lot: [id number]   NDC: [address]-7

## 2020-05-12 ENCOUNTER — Other Ambulatory Visit: Payer: Self-pay | Admitting: Physician Assistant

## 2020-05-12 DIAGNOSIS — N3281 Overactive bladder: Secondary | ICD-10-CM

## 2020-05-20 ENCOUNTER — Ambulatory Visit: Payer: PRIVATE HEALTH INSURANCE | Admitting: Physician Assistant

## 2020-06-05 ENCOUNTER — Ambulatory Visit: Payer: PRIVATE HEALTH INSURANCE | Admitting: Family Medicine

## 2020-06-05 ENCOUNTER — Other Ambulatory Visit: Payer: Self-pay | Admitting: Physician Assistant

## 2020-06-05 DIAGNOSIS — N3281 Overactive bladder: Secondary | ICD-10-CM

## 2020-06-13 ENCOUNTER — Other Ambulatory Visit: Payer: Self-pay | Admitting: Physician Assistant

## 2020-06-13 DIAGNOSIS — M545 Low back pain, unspecified: Secondary | ICD-10-CM

## 2020-06-24 ENCOUNTER — Ambulatory Visit (INDEPENDENT_AMBULATORY_CARE_PROVIDER_SITE_OTHER): Payer: PRIVATE HEALTH INSURANCE | Admitting: Physician Assistant

## 2020-06-24 ENCOUNTER — Other Ambulatory Visit: Payer: Self-pay

## 2020-06-24 ENCOUNTER — Encounter: Payer: Self-pay | Admitting: Physician Assistant

## 2020-06-24 VITALS — BP 112/70 | HR 105 | Temp 98.0°F | Resp 16 | Ht 64.25 in | Wt 180.0 lb

## 2020-06-24 DIAGNOSIS — N3281 Overactive bladder: Secondary | ICD-10-CM | POA: Diagnosis not present

## 2020-06-24 DIAGNOSIS — R32 Unspecified urinary incontinence: Secondary | ICD-10-CM

## 2020-06-24 DIAGNOSIS — K219 Gastro-esophageal reflux disease without esophagitis: Secondary | ICD-10-CM | POA: Diagnosis not present

## 2020-06-24 DIAGNOSIS — L089 Local infection of the skin and subcutaneous tissue, unspecified: Secondary | ICD-10-CM

## 2020-06-24 MED ORDER — CIPROFLOXACIN HCL 500 MG PO TABS
500.0000 mg | ORAL_TABLET | Freq: Two times a day (BID) | ORAL | 0 refills | Status: DC
Start: 2020-06-24 — End: 2020-07-14

## 2020-06-24 NOTE — Patient Instructions (Addendum)
Please keep the skin clean and dry.  Apply powder to the area to help wick away moisture.  Take the antibiotic as directed with food.  Stop the steroid cream in this area.  Let me know if symptoms are not resolving over the next 7-10 days.   You will be contacted by Gastroenterology and Urology for appointments.   Please reschedule your PAP since you did not have done today.

## 2020-06-24 NOTE — Progress Notes (Signed)
Patient presents to clinic today initially for routine PAP and follow-up regarding OAB, but patient also with acute concerns today.   Regarding her overactive bladder, patient was started on Detrol LA at last visit.  Endorses taking medications as directed and tolerating well overall.  Denies any noted side effect.  Patient endorses she has noted quite the improvement in urinary urgency and frequency with this medication.  Has noted significant issue with incontinence, mainly with laughing, coughing or sneezing but is sometimes have episodes where her bladder just releases on her.  Notes one episode of nocturnal enuresis in the past month.  States this was new for her.  Patient's acute concern today is regarding a rash/wound of her gluteal cleft.  Notes this is been present for a few weeks.  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  Notes that.  Became more and more reddened and slightly uncomfortable.  Patient was evaluated at urgent care where they initially thought rash might be related to ease.  Was given a antiyeast and steroid cream that she has been using.  Noted some improvement in severity of redness but otherwise feels the area is unchanged.  Would like second opinion regarding this.  Patient also with history of GERD, currently on daily PPI.  Has breakthrough symptoms.  Has previously declined GI evaluation but now would like to proceed with this.  Past Medical History:  Diagnosis Date  . Adult ADHD    managed by Dr. Evelene Croon  . Anxiety and depression    Managed by Dr. Evelene Croon.  Marland Kitchen BRVO (branch retinal vein occlusion) 2014   R eye 2014--followed by Dr. Luciana Axe.  MRI brain 11/19/15 showed mildly advanced generalized atrophy for age, +chronic small vessel ischemic changes, no acute finding.  . Depression   . Family history of colon cancer    11/2017 colonoscopy NORM.  Recall 5 yrs (Dr. Rhea Belton)  . Postmenopausal vaginal bleeding 05/2017   Pelvic u/s 06/2017  with thickened endometrium---referred to GYN for endometrial biopsy.  . Shingles 06/07/2019   L waistline   . Stroke [facility]) 2014   rt.eye    Current Outpatient Medications on File Prior to Visit  Medication Sig Dispense Refill  . ALPRAZolam (XANAX) 1 MG tablet Take 1 mg by mouth 3 (three) times daily.    Marland Kitchen amphetamine-dextroamphetamine (ADDERALL) 20 MG tablet Take 20 mg by mouth 3 (three) times daily.    Marland Kitchen BIOTIN PO Take 1 tablet by mouth daily.    . cyclobenzaprine (FLEXERIL) 10 MG tablet TAKE 1 TABLET BY MOUTH TWICE A DAY AS NEEDED FOR MUSCLE SPASMS 60 tablet 1  . desvenlafaxine (PRISTIQ) 100 MG 24 hr tablet Take 100 mg by mouth every morning.    Marland Kitchen FML LIQUIFILM 0.1 % ophthalmic suspension Instill 1 drop into both eyes four times a day as directed QID OU X 1 WK, BID OU X 1 WK    . hydrochlorothiazide (MICROZIDE) 12.5 MG capsule TAKE 1 CAPSULE BY MOUTH EVERY DAY 90 capsule 1  . loteprednol (LOTEMAX) 0.5 % ophthalmic suspension Instill 1 drop into both eyes four times a day as directed QID OU x 1wk, BID OU x 1wk    . meloxicam (MOBIC) 15 MG tablet Take 1 tablet (15 mg total) by mouth daily. 90 tablet 0  . mirtazapine (REMERON) 30 MG tablet Take 30 mg by mouth at bedtime.    . Multiple Vitamins-Minerals (CENTRUM SILVER PO) Take 1 tablet daily by mouth.    Marland Kitchen  pantoprazole (PROTONIX) 40 MG tablet TAKE 1 TABLET BY MOUTH EVERY DAY 90 tablet 1  . tolterodine (DETROL LA) 2 MG 24 hr capsule TAKE 1 CAPSULE (2 MG TOTAL) BY MOUTH DAILY. 30 capsule 1  . traZODone (DESYREL) 50 MG tablet     . valACYclovir (VALTREX) 1000 MG tablet Take 2 tablets by mouth every 12 hours 4 tablet 0   No current facility-administered medications on file prior to visit.    Allergies  Allergen Reactions  . Penicillins Anaphylaxis  . Sulfa Antibiotics Anaphylaxis    Family History  Problem Relation Age of Onset  . Hyperlipidemia Mother   . Thyroid disease Mother   . Colon polyps Mother   . Heart attack Mother   .  Stroke Father   . Hypertension Father   . Hyperlipidemia Father   . Cancer Father        Lung  . Cancer Maternal Grandmother        colon cancer  . Healthy Sister   . Healthy Son   . Healthy Daughter   . Colon cancer Maternal Uncle   . Breast cancer Paternal Aunt   . Lung cancer Paternal Aunt   . Bone cancer Paternal Aunt   . Colon cancer Maternal Uncle   . Esophageal cancer Neg Hx   . Rectal cancer Neg Hx   . Stomach cancer Neg Hx     Social History   Socioeconomic History  . Marital status: Divorced    Spouse name: Not on file  . Number of children: Not on file  . Years of education: Not on file  . Highest education level: Not on file  Occupational History  . Not on file  Tobacco Use  . Smoking status: Current Some Day Smoker    Packs/day: 0.20    Years: 2.00    Pack years: 0.40    Types: Cigarettes  . Smokeless tobacco: Never Used  Vaping Use  . Vaping Use: Never used  Substance and Sexual Activity  . Alcohol use: Yes    Alcohol/week: 0.0 standard drinks    Comment: 1-3 beers a day  . Drug use: No  . Sexual activity: Not Currently    Birth control/protection: Abstinence  Other Topics Concern  . Not on file  Social History Narrative   She works in the house cleaning business   She is divorced for 9 years   Has two children - daughter goes back and forth between.   Tob: 1 pack q 3d--current as of 02/2017.   Alc: 12 pack per week.   Social Determinants of Health   Financial Resource Strain:   . Difficulty of Paying Living Expenses:   Food Insecurity:   . Worried About Programme researcher, broadcasting/film/video in the Last Year:   . Barista in the Last Year:   Transportation Needs:   . Freight forwarder (Medical):   Marland Kitchen Lack of Transportation (Non-Medical):   Physical Activity:   . Days of Exercise per Week:   . Minutes of Exercise per Session:   Stress:   . Feeling of Stress :   Social Connections:   . Frequency of Communication with Friends and Family:   .  Frequency of Social Gatherings with Friends and Family:   . Attends Religious Services:   . Active Member of Clubs or Organizations:   . Attends Banker Meetings:   Marland Kitchen Marital Status:    Review of Systems - See HPI.  All other ROS are negative.  BP 112/70   Pulse (!) 105   Temp 98 F (36.7 C) (Temporal)   Resp 16   Ht 5' 4.25" (1.632 m)   Wt 180 lb (81.6 kg)   LMP 12/11/2013 Comment: several years ago  SpO2 98%   BMI 30.66 kg/m   Physical Exam Vitals reviewed. Exam conducted with a chaperone present.  Constitutional:      Appearance: Normal appearance.  HENT:     Head: Normocephalic and atraumatic.     Right Ear: Tympanic membrane normal.     Left Ear: Tympanic membrane normal.  Cardiovascular:     Rate and Rhythm: Normal rate and regular rhythm.     Pulses: Normal pulses.     Heart sounds: Normal heart sounds.  Pulmonary:     Effort: Pulmonary effort is normal.     Breath sounds: Normal breath sounds.  Genitourinary:    Comments: Starkly erythematous region of gluteal cleft with well-defined borders, concerning for strep skin infection. Musculoskeletal:     Cervical back: Neck supple.  Neurological:     General: No focal deficit present.     Mental Status: She is alert.  Psychiatric:        Mood and Affect: Mood normal.     Recent Results (from the past 2160 hour(s))  CBC with Differential/Platelet     Status: None   Collection Time: 04/17/20  9:17 AM  Result Value Ref Range   WBC 7.5 4.0 - 10.5 K/uL   RBC 4.43 3.87 - 5.11 Mil/uL   Hemoglobin 14.7 12.0 - 15.0 g/dL   HCT 16.141.9 36 - 46 %   MCV 94.5 78.0 - 100.0 fl   MCHC 35.1 30.0 - 36.0 g/dL   RDW 09.613.4 04.511.5 - 40.915.5 %   Platelets 231.0 150 - 400 K/uL   Neutrophils Relative % 56.2 43 - 77 %   Lymphocytes Relative 34.0 12 - 46 %   Monocytes Relative 6.6 3 - 12 %   Eosinophils Relative 2.5 0 - 5 %   Basophils Relative 0.7 0 - 3 %   Neutro Abs 4.2 1.4 - 7.7 K/uL   Lymphs Abs 2.5 0.7 - 4.0 K/uL    Monocytes Absolute 0.5 0 - 1 K/uL   Eosinophils Absolute 0.2 0 - 0 K/uL   Basophils Absolute 0.1 0 - 0 K/uL  Comprehensive metabolic panel     Status: Abnormal   Collection Time: 04/17/20  9:17 AM  Result Value Ref Range   Sodium 136 135 - 145 mEq/L   Potassium 3.6 3.5 - 5.1 mEq/L   Chloride 101 96 - 112 mEq/L   CO2 29 19 - 32 mEq/L   Glucose, Bld 169 (H) 70 - 99 mg/dL   BUN 13 6 - 23 mg/dL   Creatinine, Ser 8.110.63 0.40 - 1.20 mg/dL   Total Bilirubin 0.6 0.2 - 1.2 mg/dL   Alkaline Phosphatase 122 (H) 39 - 117 U/L   AST 25 0 - 37 U/L   ALT 26 0 - 35 U/L   Total Protein 7.1 6.0 - 8.3 g/dL   Albumin 4.2 3.5 - 5.2 g/dL   GFR 91.4797.86 >82.95>60.00 mL/min   Calcium 8.8 8.4 - 10.5 mg/dL  Lipid panel     Status: Abnormal   Collection Time: 04/17/20  9:17 AM  Result Value Ref Range   Cholesterol 167 0 - 200 mg/dL    Comment: ATP III Classification       Desirable:  <  200 mg/dL               Borderline High:  200 - 239 mg/dL          High:  > = 354 mg/dL   Triglycerides 656.8 (H) 0 - 149 mg/dL    Comment: Normal:  <127 mg/dLBorderline High:  150 - 199 mg/dL   HDL 51.70 >01.74 mg/dL   VLDL 94.4 0.0 - 96.7 mg/dL   LDL Cholesterol 83 0 - 99 mg/dL   Total CHOL/HDL Ratio 3     Comment:                Men          Women1/2 Average Risk     3.4          3.3Average Risk          5.0          4.42X Average Risk          9.6          7.13X Average Risk          15.0          11.0                       NonHDL 119.38     Comment: NOTE:  Non-HDL goal should be 30 mg/dL higher than patient's LDL goal (i.e. LDL goal of < 70 mg/dL, would have non-HDL goal of < 100 mg/dL)  Hemoglobin R9F     Status: None   Collection Time: 04/17/20  9:17 AM  Result Value Ref Range   Hgb A1c MFr Bld 5.5 4.6 - 6.5 %    Comment: Glycemic Control Guidelines for People with Diabetes:Non Diabetic:  <6%Goal of Therapy: <7%Additional Action Suggested:  >8%   TSH     Status: None   Collection Time: 04/17/20  9:17 AM  Result Value Ref  Range   TSH 1.31 0.35 - 4.50 uIU/mL  Vitamin D (25 hydroxy)     Status: None   Collection Time: 04/17/20  9:17 AM  Result Value Ref Range   VITD 42.32 30.00 - 100.00 ng/mL    Assessment/Plan: 1. OAB (overactive bladder) 2. Urinary incontinence, unspecified type Will continue current regimen for now. Patient needs for in-depth assessment. Referral to Urology placed.  - Ambulatory referral to Urology  3. Gastroesophageal reflux disease without esophagitis Referral to Gastroenterology placed.  - Ambulatory referral to Gastroenterology  4. Skin infection Supportive measures and skin care reviewed with patient. Patient with allergies to penicillins and cephalosporins. Will Rx Ciprofloxacin to treat likely group b strep infection. Close follow-up discussed.   This visit occurred during the SARS-CoV-2 public health emergency.  Safety protocols were in place, including screening questions prior to the visit, additional usage of staff PPE, and extensive cleaning of exam room while observing appropriate contact time as indicated for disinfecting solutions.     Piedad Climes, PA-C

## 2020-07-08 ENCOUNTER — Telehealth: Payer: Self-pay | Admitting: Physician Assistant

## 2020-07-08 MED ORDER — NYSTATIN 100000 UNIT/ML MT SUSP: OROMUCOSAL | 0 refills | Status: DC

## 2020-07-08 NOTE — Telephone Encounter (Signed)
Patient called complaining of dry mouth and said that now she has thrush on her tongue - She feels that it is due to one of her medications - Please advise.

## 2020-07-08 NOTE — Telephone Encounter (Signed)
If from medication would most likely be from one of her depression meds prescribed by psychiatry or the Detrol. Would have her stop the Detrol for a few days to see if symptoms dissipate. If so then we have found out culprit and would stop the medicine completely, having her follow-up with Urology as already planned for further management.

## 2020-07-08 NOTE — Telephone Encounter (Signed)
Spoke with patient and she is agreeable with stopping the Detrol due to dry mouth. For the thrush PCP prescribed Nystatin mouthwash to use. Rx sent to the pharmacy. If symptoms doesn't improve will have to discuss with psychiatry about depression medication. Will refer to Urology for further medication for the incontinence. Patient has not heard from Urology about an appointment yet. She states she will call them

## 2020-07-10 ENCOUNTER — Other Ambulatory Visit: Payer: Self-pay | Admitting: Physician Assistant

## 2020-07-10 DIAGNOSIS — N3281 Overactive bladder: Secondary | ICD-10-CM

## 2020-07-11 ENCOUNTER — Telehealth: Payer: Self-pay | Admitting: Physician Assistant

## 2020-07-11 NOTE — Telephone Encounter (Signed)
Pt called in stating that she has a rash on her bottom that is like a diaper rash. She wanted to know if cody to send her in something. She is out of town but wanted to know if Selena Batten could send in another round of antibiotics. She states that this was discussed at her last visit with him.    [facility] [address] [place] [phone]

## 2020-07-11 NOTE — Telephone Encounter (Signed)
Please advise 

## 2020-07-14 MED ORDER — CIPROFLOXACIN HCL 500 MG PO TABS: 500.0000 mg | ORAL_TABLET | Freq: Two times a day (BID) | ORAL | 0 refills | Status: DC

## 2020-07-14 NOTE — Telephone Encounter (Signed)
Left detailed message on VM that rx for Cipro was sent to the [facility] in [place] for patient. If it does not improve after antibiotics then will need to be seen in office.

## 2020-07-14 NOTE — Telephone Encounter (Signed)
Ok to send in refill of antibiotic. If not clearing, want reassessment in office.

## 2020-07-18 ENCOUNTER — Encounter: Payer: Self-pay | Admitting: Physician Assistant

## 2020-07-18 ENCOUNTER — Other Ambulatory Visit: Payer: Self-pay

## 2020-07-18 ENCOUNTER — Telehealth (INDEPENDENT_AMBULATORY_CARE_PROVIDER_SITE_OTHER): Payer: PRIVATE HEALTH INSURANCE | Admitting: Physician Assistant

## 2020-07-18 DIAGNOSIS — L509 Urticaria, unspecified: Secondary | ICD-10-CM

## 2020-07-18 NOTE — Progress Notes (Signed)
Virtual Visit via Video   I connected with patient on 07/18/20 at 11:30 AM EDT by a video enabled telemedicine application and verified that I am speaking with the correct person using two identifiers.  Location patient: Home Location provider: [facility] [facility], Office Persons participating in the virtual visit: Patient, Provider, CMA (Patina Moore)  I discussed the limitations of evaluation and management by telemedicine and the availability of in person appointments. The patient expressed understanding and agreed to proceed.  Subjective:   HPI:   Patient presents via [facility] today complaining of an outbreak of hives on her inner, upper thighs bilaterally noted this morning.  As [place] after she got in a chlorinated pool.  Patient denies any blisters or pain.  Notes that area is quite itchy.  Denies any change in soaps, lotions or detergents.  Wears cotton underwear.  Denies any vaginal pain, pruritus or discharge.  Denies fever, chills, malaise or fatigue.  ROS:   See pertinent positives and negatives per HPI.  Patient Active Problem List   Diagnosis Date Noted  . Acute appendicitis with rupture 03/01/2019  . Mood disorder (HCC) 10/23/2015  . Depression 09/10/2015  . Unspecified essential hypertension 01/15/2014  . ADD (attention deficit disorder) 01/15/2014  . Anxiety state, unspecified 01/15/2014    Social History   Tobacco Use  . Smoking status: Current Some Day Smoker    Packs/day: 0.20    Years: 2.00    Pack years: 0.40    Types: Cigarettes  . Smokeless tobacco: Never Used  Substance Use Topics  . Alcohol use: Yes    Alcohol/week: 0.0 standard drinks    Comment: 1-3 beers a day    Current Outpatient Medications:  .  ALPRAZolam (XANAX) 1 MG tablet, Take 1 mg by mouth 3 (three) times daily., Disp: , Rfl:  .  amphetamine-dextroamphetamine (ADDERALL) 20 MG tablet, Take 20 mg by mouth  3 (three) times daily., Disp: , Rfl:  .  BIOTIN PO, Take 1 tablet by mouth daily., Disp: , Rfl:  .  cyclobenzaprine (FLEXERIL) 10 MG tablet, TAKE 1 TABLET BY MOUTH TWICE A DAY AS NEEDED FOR MUSCLE SPASMS, Disp: 60 tablet, Rfl: 1 .  desvenlafaxine (PRISTIQ) 100 MG 24 hr tablet, Take 100 mg by mouth every morning., Disp: , Rfl:  .  FML LIQUIFILM 0.1 % ophthalmic suspension, Instill 1 drop into both eyes four times a day as directed QID OU X 1 WK, BID OU X 1 WK, Disp: , Rfl:  .  hydrochlorothiazide (MICROZIDE) 12.5 MG capsule, TAKE 1 CAPSULE BY MOUTH EVERY DAY, Disp: 90 capsule, Rfl: 1 .  loteprednol (LOTEMAX) 0.5 % ophthalmic suspension, Instill 1 drop into both eyes four times a day as directed QID OU x 1wk, BID OU x 1wk, Disp: , Rfl:  .  meloxicam (MOBIC) 15 MG tablet, Take 1 tablet (15 mg total) by mouth daily., Disp: 90 tablet, Rfl: 0 .  mirtazapine (REMERON) 30 MG tablet, Take 30 mg by mouth at bedtime., Disp: , Rfl:  .  Multiple Vitamins-Minerals (CENTRUM SILVER PO), Take 1 tablet daily by mouth., Disp: , Rfl:  .  nystatin (MYCOSTATIN) 100000 UNIT/ML suspension, Swish and spit 5 ml by mouth four times a day, Disp: 60 mL, Rfl: 0 .  pantoprazole (PROTONIX) 40 MG tablet, TAKE 1 TABLET BY MOUTH EVERY DAY, Disp: 90 tablet, Rfl: 1 .  tolterodine (DETROL LA) 2 MG 24 hr  capsule, TAKE 1 CAPSULE (2 MG TOTAL) BY MOUTH DAILY. (Patient taking differently: Take 2 mg by mouth every other day. ), Disp: 30 capsule, Rfl: 1 .  traZODone (DESYREL) 50 MG tablet, , Disp: , Rfl:  .  valACYclovir (VALTREX) 1000 MG tablet, Take 2 tablets by mouth every 12 hours, Disp: 4 tablet, Rfl: 0  Allergies  Allergen Reactions  . Penicillins Anaphylaxis  . Sulfa Antibiotics Anaphylaxis    Objective:   LMP 12/11/2013 Comment: several years ago  Patient is well-developed, well-nourished in no acute distress.  Resting comfortably at home.  Head is normocephalic, atraumatic.  No labored breathing.  Speech is clear and  coherent with logical content.  Patient is alert and oriented at baseline.   Assessment and Plan:   1. Urticarial rash Seems like mild reaction to something.  She is to keep a symptom journal.  No new soaps, lotions or detergents.  Avoid any scented or dye hygiene products.  We will have her apply topical cortisone or Benadryl cream.  Start Claritin once daily.  Also to start a 20 mg famotidine OTC daily to calm this down.  Recommend she follow-up in office on Monday for reevaluation.    Piedad Climes, PA-C 07/18/2020

## 2020-07-18 NOTE — Patient Instructions (Signed)
Instructions sent to MyChart

## 2020-07-22 ENCOUNTER — Other Ambulatory Visit: Payer: Self-pay | Admitting: Physician Assistant

## 2020-07-25 ENCOUNTER — Ambulatory Visit: Payer: PRIVATE HEALTH INSURANCE | Admitting: Neurology

## 2020-07-28 ENCOUNTER — Other Ambulatory Visit (INDEPENDENT_AMBULATORY_CARE_PROVIDER_SITE_OTHER): Payer: PRIVATE HEALTH INSURANCE

## 2020-07-28 ENCOUNTER — Other Ambulatory Visit: Payer: Self-pay

## 2020-07-28 ENCOUNTER — Encounter: Payer: Self-pay | Admitting: Neurology

## 2020-07-28 ENCOUNTER — Ambulatory Visit (INDEPENDENT_AMBULATORY_CARE_PROVIDER_SITE_OTHER): Payer: PRIVATE HEALTH INSURANCE | Admitting: Neurology

## 2020-07-28 VITALS — BP 162/110 | HR 129 | Resp 18 | Ht 64.0 in | Wt 177.0 lb

## 2020-07-28 DIAGNOSIS — F430 Acute stress reaction: Secondary | ICD-10-CM | POA: Diagnosis not present

## 2020-07-28 DIAGNOSIS — R413 Other amnesia: Secondary | ICD-10-CM

## 2020-07-28 DIAGNOSIS — F39 Unspecified mood [affective] disorder: Secondary | ICD-10-CM

## 2020-07-28 LAB — VITAMIN B12: Vitamin B-12: 1278 pg/mL — ABNORMAL HIGH (ref 211–911)
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
## 2020-07-29 ENCOUNTER — Telehealth: Payer: Self-pay | Admitting: Physician Assistant

## 2020-07-29 NOTE — Telephone Encounter (Signed)
Patient still has rash between her legs, near pubic area - Please advise

## 2020-07-30 ENCOUNTER — Encounter: Payer: Self-pay | Admitting: Physician Assistant

## 2020-07-30 ENCOUNTER — Other Ambulatory Visit: Payer: Self-pay

## 2020-07-30 ENCOUNTER — Ambulatory Visit (INDEPENDENT_AMBULATORY_CARE_PROVIDER_SITE_OTHER): Payer: PRIVATE HEALTH INSURANCE | Admitting: Physician Assistant

## 2020-07-30 VITALS — BP 122/78 | HR 108 | Temp 98.0°F | Resp 16 | Ht 64.0 in | Wt 181.0 lb

## 2020-07-30 DIAGNOSIS — S99921A Unspecified injury of right foot, initial encounter: Secondary | ICD-10-CM | POA: Diagnosis not present

## 2020-07-30 DIAGNOSIS — R21 Rash and other nonspecific skin eruption: Secondary | ICD-10-CM | POA: Diagnosis not present

## 2020-07-30 MED ORDER — NAFTIFINE HCL 1 % EX CREA: TOPICAL_CREAM | Freq: Every day | CUTANEOUS | 0 refills | Status: DC

## 2020-07-30 NOTE — Progress Notes (Signed)
Patient presents to clinic today c/o continued rash of inguinal region bilaterally. Patient denies any recurrence of urticarial lesion but notes linear areas of redness and irritation. Just realized this morning that she switched sanitary pad brands just before symptoms started. No other known changes. Patient does note she has her appointment next week with Urology for her issues with leakage and mixed incontinence.    Past Medical History:  Diagnosis Date  . Adult ADHD    managed by Dr. Evelene Croon  . Anxiety and depression    Managed by Dr. Evelene Croon.  Marland Kitchen BRVO (branch retinal vein occlusion) 2014   R eye 2014--followed by Dr. Luciana Axe.  MRI brain 11/19/15 showed mildly advanced generalized atrophy for age, +chronic small vessel ischemic changes, no acute finding.  . Depression   . Family history of colon cancer    11/2017 colonoscopy NORM.  Recall 5 yrs (Dr. Rhea Belton)  . Postmenopausal vaginal bleeding 05/2017   Pelvic u/s 06/2017 with thickened endometrium---referred to GYN for endometrial biopsy.  . Shingles 06/07/2019   L waistline   . Stroke [facility]) 2014   rt.eye    Current Outpatient Medications on File Prior to Visit  Medication Sig Dispense Refill  . ALPRAZolam (XANAX) 1 MG tablet Take 1 mg by mouth 3 (three) times daily.    Marland Kitchen amphetamine-dextroamphetamine (ADDERALL) 20 MG tablet Take 20 mg by mouth 3 (three) times daily.    Marland Kitchen BIOTIN PO Take 1 tablet by mouth daily.    . cyclobenzaprine (FLEXERIL) 10 MG tablet TAKE 1 TABLET BY MOUTH TWICE A DAY AS NEEDED FOR MUSCLE SPASMS 60 tablet 1  . desvenlafaxine (PRISTIQ) 100 MG 24 hr tablet Take 100 mg by mouth every morning.    Marland Kitchen FML LIQUIFILM 0.1 % ophthalmic suspension Instill 1 drop into both eyes four times a day as directed QID OU X 1 WK, BID OU X 1 WK    . gabapentin (NEURONTIN) 400 MG capsule Take 400 mg by mouth in the morning, at noon, in the evening, and at bedtime.    . hydrochlorothiazide (MICROZIDE) 12.5 MG capsule TAKE 1 CAPSULE BY  MOUTH EVERY DAY 90 capsule 1  . loteprednol (LOTEMAX) 0.5 % ophthalmic suspension Instill 1 drop into both eyes four times a day as directed QID OU x 1wk, BID OU x 1wk    . meloxicam (MOBIC) 15 MG tablet Take 1 tablet (15 mg total) by mouth daily. 90 tablet 0  . mirtazapine (REMERON) 30 MG tablet Take 30 mg by mouth at bedtime.    . Multiple Vitamins-Minerals (CENTRUM SILVER PO) Take 1 tablet daily by mouth.    . nystatin (MYCOSTATIN) 100000 UNIT/ML suspension Swish and spit 5 ml by mouth four times a day 60 mL 0  . pantoprazole (PROTONIX) 40 MG tablet TAKE 1 TABLET BY MOUTH EVERY DAY 90 tablet 1  . tolterodine (DETROL LA) 2 MG 24 hr capsule TAKE 1 CAPSULE (2 MG TOTAL) BY MOUTH DAILY. (Patient taking differently: Take 2 mg by mouth every other day. ) 30 capsule 1  . traZODone (DESYREL) 50 MG tablet     . valACYclovir (VALTREX) 1000 MG tablet Take 2 tablets by mouth every 12 hours 4 tablet 0   No current facility-administered medications on file prior to visit.    Allergies  Allergen Reactions  . Penicillins Anaphylaxis  . Sulfa Antibiotics Anaphylaxis    Family History  Problem Relation Age of Onset  . Hyperlipidemia Mother   . Thyroid disease Mother   .  Colon polyps Mother   . Heart attack Mother   . Stroke Father   . Hypertension Father   . Hyperlipidemia Father   . Cancer Father        Lung  . Cancer Maternal Grandmother        colon cancer  . Healthy Sister   . Healthy Son   . Healthy Daughter   . Colon cancer Maternal Uncle   . Breast cancer Paternal Aunt   . Lung cancer Paternal Aunt   . Bone cancer Paternal Aunt   . Colon cancer Maternal Uncle   . Esophageal cancer Neg Hx   . Rectal cancer Neg Hx   . Stomach cancer Neg Hx     Social History   Socioeconomic History  . Marital status: Divorced    Spouse name: Not on file  . Number of children: Not on file  . Years of education: Not on file  . Highest education level: Not on file  Occupational History  . Not  on file  Tobacco Use  . Smoking status: Current Some Day Smoker    Packs/day: 0.20    Years: 2.00    Pack years: 0.40    Types: Cigarettes  . Smokeless tobacco: Never Used  Vaping Use  . Vaping Use: Never used  Substance and Sexual Activity  . Alcohol use: Yes    Alcohol/week: 0.0 standard drinks    Comment: 1-3 beers a day  . Drug use: No  . Sexual activity: Not Currently    Birth control/protection: Abstinence  Other Topics Concern  . Not on file  Social History Narrative   She works in the house cleaning business   She is divorced for 9 years   Has two children - daughter goes back and forth between.   Tob: 1 pack q 3d--current as of 02/2017.   Alc: 12 pack per week.   Right handed   One story home   Drinks caffeine   Social Determinants of Health   Financial Resource Strain:   . Difficulty of Paying Living Expenses:   Food Insecurity:   . Worried About Programme researcher, broadcasting/film/video in the Last Year:   . Barista in the Last Year:   Transportation Needs:   . Freight forwarder (Medical):   Marland Kitchen Lack of Transportation (Non-Medical):   Physical Activity:   . Days of Exercise per Week:   . Minutes of Exercise per Session:   Stress:   . Feeling of Stress :   Social Connections:   . Frequency of Communication with Friends and Family:   . Frequency of Social Gatherings with Friends and Family:   . Attends Religious Services:   . Active Member of Clubs or Organizations:   . Attends Banker Meetings:   Marland Kitchen Marital Status:    Review of Systems - See HPI.  All other ROS are negative.  Ht 5\' 4"  (1.626 m)   Wt 181 lb (82.1 kg)   LMP 12/11/2013 Comment: several years ago  BMI 31.07 kg/m   Physical Exam Vitals reviewed. Exam conducted with a chaperone present.  HENT:     Head: Normocephalic and atraumatic.  Cardiovascular:     Rate and Rhythm: Normal rate and regular rhythm.     Pulses: Normal pulses.     Heart sounds: Normal heart sounds.    Genitourinary:      Recent Results (from the past 2160 hour(s))  Vitamin B12  Status: Abnormal   Collection Time: 07/28/20  1:46 PM  Result Value Ref Range   Vitamin B-12 1,278 (H) 211 - 911 pg/mL    Assessment/Plan: 1. Injury of great toe, right, initial encounter Patient mentioned at end of visit. Fall sustained > 1 week ago where she slipped on a vacuum cord. Notes hitting her foot on concrete. Notes pain and swelling of R Great toe with bruising. Notes she has continued to work despite injury. States she can bear weight but this does cause pain. ROM with pain. Denies numbness or tingling of the toe or foot.  Examination of R foot reveals swelling and bruising of R 1st MTP, and proximal phalanx with point tenderness noted.  - DG Foot Complete Right; Future  2. Rash of groin Inguinal folds. Likely irritant dermatitis from pad ingredient -- attempted to read label but all in [place] so not sure of ingredients. She has already stopped use of this with some noted improvement. Some concern for mild yeast as well. Rx Naftin cream. Hygiene discussed. Ok to use barrier cream if needed. Follow-up if not continuing to resolve.   This visit occurred during the SARS-CoV-2 public health emergency.  Safety protocols were in place, including screening questions prior to the visit, additional usage of staff PPE, and extensive cleaning of exam room while observing appropriate contact time as indicated for disinfecting solutions.     Piedad Climes, PA-C

## 2020-07-30 NOTE — Patient Instructions (Addendum)
Please avoid use of the pads you have recently started using as I think they are causing a lot of irritation.  Apply the prescription cream as directed. Continue the barrier cream.  After showering, make sure to dry very well, using the hair dryer if needed.   Follow-up with Urology as scheduled.   Please go to imagng at [facility] for x-ray.  I will call as soon as we have results.  Wear your post-op shoe until we get this assessment.

## 2020-08-11 ENCOUNTER — Other Ambulatory Visit: Payer: Self-pay | Admitting: Physician Assistant

## 2020-08-12 ENCOUNTER — Other Ambulatory Visit: Payer: Self-pay

## 2020-08-12 ENCOUNTER — Ambulatory Visit (INDEPENDENT_AMBULATORY_CARE_PROVIDER_SITE_OTHER)
Admission: RE | Admit: 2020-08-12 | Discharge: 2020-08-12 | Disposition: A | Discharge status: Home / Self Care | Payer: PRIVATE HEALTH INSURANCE | Source: Ambulatory Visit | Attending: Physician Assistant | Admitting: Physician Assistant

## 2020-08-12 DIAGNOSIS — S92414A Nondisplaced fracture of proximal phalanx of right great toe, initial encounter for closed fracture: Secondary | ICD-10-CM

## 2020-08-12 DIAGNOSIS — S99921A Unspecified injury of right foot, initial encounter: Secondary | ICD-10-CM | POA: Diagnosis not present

## 2020-08-16 ENCOUNTER — Other Ambulatory Visit: Payer: Self-pay | Admitting: Physician Assistant

## 2020-08-19 ENCOUNTER — Ambulatory Visit: Payer: PRIVATE HEALTH INSURANCE | Admitting: Podiatry

## 2020-08-20 ENCOUNTER — Encounter: Payer: Self-pay | Admitting: Internal Medicine

## 2020-08-20 ENCOUNTER — Ambulatory Visit (INDEPENDENT_AMBULATORY_CARE_PROVIDER_SITE_OTHER): Payer: PRIVATE HEALTH INSURANCE | Admitting: Internal Medicine

## 2020-08-20 VITALS — BP 124/88 | HR 97 | Ht 64.0 in | Wt 185.4 lb

## 2020-08-20 DIAGNOSIS — R1310 Dysphagia, unspecified: Secondary | ICD-10-CM

## 2020-08-20 DIAGNOSIS — R1319 Other dysphagia: Secondary | ICD-10-CM

## 2020-08-20 NOTE — Patient Instructions (Signed)
You have been scheduled for an endoscopy. Please follow written instructions given to you at your visit today. If you use inhalers (even only as needed), please bring them with you on the day of your procedure.  Continue pantoprazole  If you are age 56 or older, your body mass index should be between 23-30. Your Body mass index is 31.82 kg/m. If this is out of the aforementioned range listed, please consider follow up with your Primary Care Provider.  If you are age 25 or younger, your body mass index should be between 19-25. Your Body mass index is 31.82 kg/m. If this is out of the aformentioned range listed, please consider follow up with your Primary Care Provider.   Due to recent changes in healthcare laws, you may see the results of your imaging and laboratory studies on MyChart before your provider has had a chance to review them.  We understand that in some cases there may be results that are confusing or concerning to you. Not all laboratory results come back in the same time frame and the provider may be waiting for multiple results in order to interpret others.  Please give [place] 48 hours in order for your provider to thoroughly review all the results before contacting the office for clarification of your results.

## 2020-08-21 NOTE — Progress Notes (Signed)
Patient ID: Haley Martin, female   DOB: 01-16-1966, 54 y.o.   MRN: [id number] HPI: Haley Martin is a 54 year old female with a past medical history of GERD, family history of colon cancer, history of anxiety and depression, history of ADHD who is seen in consultation at the request of Malva Cogan, PA-C to evaluate dysphagia.  She is here alone today.  She is known to me from screening colonoscopy performed on 11/30/2017.  This was normal without polyps.  5-year recall was recommended based on family history of colon cancer in multiple second-degree relatives and colon polyps in first-degree relative.  She reports that she has had solid food dysphagia intermittently on and off over the last 6 years.  This is occurring more frequently of late.  6 years ago she recalls while eating chicken she had a food impaction which lasted several minutes.  Over the last few months she has had solid food dysphagia without odynophagia.  She has a history of heartburn and has been taking pantoprazole 40 mg daily for several years.  There is no odynophagia.  No weight loss.  No abdominal pain.  No heartburn recently with the pantoprazole.  She has been under increased stress as her father died this year.  He had COVID-19 and then developed pneumonia and was later diagnosed with lung cancer.  The patient herself also had a rather severe case of COVID-19 earlier in the year.  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  Past Medical History:  Diagnosis Date  . Adult ADHD    managed by Dr. Evelene Croon  . Anxiety and depression    Managed by Dr. Evelene Croon.  Marland Kitchen BRVO (branch retinal vein occlusion) 2014   R eye 2014--followed by Dr. Luciana Axe.  MRI brain 11/19/15 showed mildly advanced generalized atrophy for age, +chronic small vessel ischemic changes, no acute finding.  . Depression   . Family history of colon cancer    11/2017 colonoscopy NORM.  Recall 5 yrs (Dr. Rhea Belton)  . Postmenopausal  vaginal bleeding 05/2017   Pelvic u/s 06/2017 with thickened endometrium---referred to GYN for endometrial biopsy.  . Shingles 06/07/2019   L waistline   . Stroke [facility]) 2014   rt.eye    Past Surgical History:  Procedure Laterality Date  . APPENDECTOMY  02/28/2019  . BREAST ENHANCEMENT SURGERY    . Carotid dopplers  09/19/2015   NORMAL  . chin implant    . COLONOSCOPY  11/30/2017   NORMAL: recall 5 yrs ([facility])  . FINGER SURGERY     cyst  . LAPAROSCOPIC APPENDECTOMY N/A 03/01/2019   Procedure: APPENDECTOMY LAPAROSCOPIC WITH DRAIN PLACEMENT;  Surgeon: Darnell Level, MD;  Location: [facility];  Service: General;  Laterality: N/A;  . NECK SURGERY  REMOTE past   lymph node excision--benign per pt report  . SALIVARY GLAND SURGERY     stone   . TMJ ARTHROPLASTY      Outpatient Medications Prior to Visit  Medication Sig Dispense Refill  . ALPRAZolam (XANAX) 1 MG tablet Take 1 mg by mouth 3 (three) times daily.    Marland Kitchen amphetamine-dextroamphetamine (ADDERALL) 20 MG tablet Take 20 mg by mouth 3 (three) times daily.    Marland Kitchen BIOTIN PO Take 1 tablet by mouth daily.    . cyclobenzaprine (FLEXERIL) 10 MG tablet TAKE 1 TABLET BY MOUTH TWICE A DAY AS NEEDED FOR MUSCLE SPASMS 60 tablet 1  . desvenlafaxine (PRISTIQ) 100 MG 24 hr tablet Take 100  mg by mouth every morning.    Marland Kitchen FML LIQUIFILM 0.1 % ophthalmic suspension Instill 1 drop into both eyes four times a day as directed QID OU X 1 WK, BID OU X 1 WK    . gabapentin (NEURONTIN) 400 MG capsule Take 400 mg by mouth in the morning, at noon, in the evening, and at bedtime.    . hydrochlorothiazide (MICROZIDE) 12.5 MG capsule TAKE 1 CAPSULE BY MOUTH EVERY DAY 90 capsule 1  . meloxicam (MOBIC) 15 MG tablet Take 1 tablet (15 mg total) by mouth daily. 90 tablet 0  . mirtazapine (REMERON) 30 MG tablet Take 30 mg by mouth at bedtime.    . Multiple Vitamins-Minerals (CENTRUM SILVER PO) Take 1 tablet daily by mouth.    . pantoprazole (PROTONIX) 40 MG tablet TAKE 1  TABLET BY MOUTH EVERY DAY 90 tablet 1  . tolterodine (DETROL LA) 2 MG 24 hr capsule TAKE 1 CAPSULE (2 MG TOTAL) BY MOUTH DAILY. (Patient taking differently: Take 2 mg by mouth 3 (three) times a week. ) 30 capsule 1  . traZODone (DESYREL) 50 MG tablet     . valACYclovir (VALTREX) 1000 MG tablet Take 2 tablets by mouth every 12 hours 4 tablet 0  . naftifine (NAFTIN) 1 % cream Apply topically daily. 30 g 0  . loteprednol (LOTEMAX) 0.5 % ophthalmic suspension Instill 1 drop into both eyes four times a day as directed QID OU x 1wk, BID OU x 1wk     No facility-administered medications prior to visit.    Allergies  Allergen Reactions  . Penicillins Anaphylaxis  . Sulfa Antibiotics Anaphylaxis    Family History  Problem Relation Age of Onset  . Hyperlipidemia Mother   . Thyroid disease Mother   . Colon polyps Mother   . Heart attack Mother   . Heart disease Mother   . Stroke Father   . Hypertension Father   . Hyperlipidemia Father   . Cancer Father        Lung  . Lung cancer Father   . Cancer Maternal Grandmother        colon cancer  . Healthy Sister   . Healthy Son   . Healthy Daughter   . Colon cancer Maternal Uncle   . Breast cancer Paternal Aunt   . Lung cancer Paternal Aunt   . Bone cancer Paternal Aunt   . Colon cancer Maternal Uncle   . Esophageal cancer Neg Hx   . Rectal cancer Neg Hx   . Stomach cancer Neg Hx     Social History   Tobacco Use  . Smoking status: Current Some Day Smoker    Packs/day: 0.20    Years: 2.00    Pack years: 0.40    Types: Cigarettes  . Smokeless tobacco: Never Used  Vaping Use  . Vaping Use: Never used  Substance Use Topics  . Alcohol use: Yes    Alcohol/week: 0.0 standard drinks    Comment: 1-3 beers a day  . Drug use: No    ROS: As per history of present illness, otherwise negative  BP 124/88 (BP Location: Left Arm, Patient Position: Sitting, Cuff Size: Normal)   Pulse 97   Ht 5\' 4"  (1.626 m)   Wt 185 lb 6 oz (84.1 kg)    LMP 12/11/2013 Comment: several years ago  BMI 31.82 kg/m  Gen: awake, alert, NAD HEENT: anicteric CV: RRR, no mrg Pulm: CTA b/l Abd: soft, NT/ND, +BS throughout Ext: no c/c/e  Neuro: nonfocal   RELEVANT LABS AND IMAGING: CBC    Component Value Date/Time   WBC 7.5 04/17/2020 0917   RBC 4.43 04/17/2020 0917   HGB 14.7 04/17/2020 0917   HCT 41.9 04/17/2020 0917   PLT 231.0 04/17/2020 0917   MCV 94.5 04/17/2020 0917   MCH 32.8 03/05/2019 0422   MCHC 35.1 04/17/2020 0917   RDW 13.4 04/17/2020 0917   LYMPHSABS 2.5 04/17/2020 0917   MONOABS 0.5 04/17/2020 0917   EOSABS 0.2 04/17/2020 0917   BASOSABS 0.1 04/17/2020 0917    CMP     Component Value Date/Time   NA 136 04/17/2020 0917   K 3.6 04/17/2020 0917   CL 101 04/17/2020 0917   CO2 29 04/17/2020 0917   GLUCOSE 169 (H) 04/17/2020 0917   BUN 13 04/17/2020 0917   CREATININE 0.63 04/17/2020 0917   CALCIUM 8.8 04/17/2020 0917   PROT 7.1 04/17/2020 0917   ALBUMIN 4.2 04/17/2020 0917   AST 25 04/17/2020 0917   ALT 26 04/17/2020 0917   ALKPHOS 122 (H) 04/17/2020 0917   BILITOT 0.6 04/17/2020 0917   GFRNONAA >60 03/02/2019 0422   GFRAA >60 03/02/2019 0422    ASSESSMENT/PLAN: 54 year old female with a past medical history of GERD, family history of colon cancer, history of anxiety and depression, history of ADHD who is seen in consultation at the request of Malva Cogan, PA-C to evaluate dysphagia.   1.  Solid food dysphagia --upper endoscopy recommended with possible dilation.  Rule out esophageal stricture, rule out EOE.  We discussed the risk, benefits and alternatives and she is agreeable and wishes to proceed.  She should continue pantoprazole 40 mg daily --Upper endoscopy in the [facility] with possible dilation --Continue pantoprazole 40 mg daily  2.  Family history of colon cancer --surveillance colonoscopy recommended in December 2023      [id number], Kristine Garbe, Pa-c [address] [place],  [place]  [address]

## 2020-08-27 ENCOUNTER — Other Ambulatory Visit: Payer: Self-pay | Admitting: Podiatry

## 2020-08-27 ENCOUNTER — Other Ambulatory Visit: Payer: Self-pay | Admitting: Physician Assistant

## 2020-08-27 ENCOUNTER — Ambulatory Visit: Payer: PRIVATE HEALTH INSURANCE

## 2020-08-27 ENCOUNTER — Ambulatory Visit (INDEPENDENT_AMBULATORY_CARE_PROVIDER_SITE_OTHER): Payer: PRIVATE HEALTH INSURANCE

## 2020-08-27 ENCOUNTER — Encounter: Payer: Self-pay | Admitting: Podiatry

## 2020-08-27 ENCOUNTER — Ambulatory Visit (INDEPENDENT_AMBULATORY_CARE_PROVIDER_SITE_OTHER): Payer: PRIVATE HEALTH INSURANCE | Admitting: Podiatry

## 2020-08-27 ENCOUNTER — Other Ambulatory Visit: Payer: Self-pay

## 2020-08-27 DIAGNOSIS — M79671 Pain in right foot: Secondary | ICD-10-CM | POA: Diagnosis not present

## 2020-08-27 DIAGNOSIS — N3281 Overactive bladder: Secondary | ICD-10-CM

## 2020-08-27 DIAGNOSIS — S92416A Nondisplaced fracture of proximal phalanx of unspecified great toe, initial encounter for closed fracture: Secondary | ICD-10-CM

## 2020-08-27 DIAGNOSIS — M79672 Pain in left foot: Secondary | ICD-10-CM

## 2020-08-27 NOTE — Progress Notes (Signed)
Subjective:   Patient ID: Haley Martin, female   DOB: 54 y.o.   MRN: [id number]   HPI Patient states that she fractured her big toes approximately 3 months ago and she has continued pain and instability secondary to the bilateral fractures that she experienced.  She does try to wear rigid bottom shoes as best as possible.  Patient does not smoke a small amount and likes to be active   Review of Systems  All other systems reviewed and are negative.       Objective:  Physical Exam Vitals and nursing note reviewed.  Constitutional:      Appearance: She is well-developed.  Pulmonary:     Effort: Pulmonary effort is normal.  Musculoskeletal:        General: Normal range of motion.  Skin:    General: Skin is warm.  Neurological:     Mental Status: She is alert.     Neurovascular status found to be intact muscle strength was found to be adequate range of motion within normal limits.  Patient is noted to have swelling of the big toe bilateral with inflammation pain of the proximal phalanx right and a more proximal portion left.  They both are tender when pressed and both are making it difficult for her to wear shoe gear comfortably     Assessment:  Chronic fracture of the hallux bilateral with pain still present secondary to healing     Plan:  H&P reviewed x-rays.  I do think that she does have moderate instability and change in gait secondary to the injuries but I do think over the next 2 to 4 months this will completely get better but it is a 83-month recovery from these types of fractures.  Patient is encouraged to come in with any other problems or discomfort which may occur but I do think ultimately they will heal but they are not there yet  X-rays indicate that there is fracture of the shaft of the proximal phalanx right hallux and at the base of the left hallux medial side

## 2020-08-29 ENCOUNTER — Telehealth: Payer: Self-pay | Admitting: Physician Assistant

## 2020-08-29 NOTE — Telephone Encounter (Signed)
Would definitely recommend COVID test and also would recommend video visit or assessment at Urgent Care.

## 2020-08-29 NOTE — Telephone Encounter (Signed)
Patient called and said that they are having a celebration of life ceremony tomorrow for her father who passed.  She states she is not running a fever, but is extremely hoarse and feels achy all over.  Should she go try to have a rapid covid test done today?  Please advise.

## 2020-08-29 NOTE — Telephone Encounter (Signed)
Please advise 

## 2020-08-29 NOTE — Telephone Encounter (Signed)
Called patient to relay PCP recommendations. Patient voiced understanding.

## 2020-09-07 ENCOUNTER — Other Ambulatory Visit: Payer: Self-pay | Admitting: Physician Assistant

## 2020-09-07 DIAGNOSIS — M545 Low back pain, unspecified: Secondary | ICD-10-CM

## 2020-09-08 NOTE — Telephone Encounter (Signed)
LFD 04/17/20 #90 with no refills LOV 07/30/20 NOV 09/10/20

## 2020-09-09 DIAGNOSIS — N3281 Overactive bladder: Secondary | ICD-10-CM | POA: Insufficient documentation

## 2020-09-09 DIAGNOSIS — N819 Female genital prolapse, unspecified: Secondary | ICD-10-CM | POA: Insufficient documentation

## 2020-09-09 DIAGNOSIS — N952 Postmenopausal atrophic vaginitis: Secondary | ICD-10-CM | POA: Insufficient documentation

## 2020-09-10 ENCOUNTER — Ambulatory Visit (INDEPENDENT_AMBULATORY_CARE_PROVIDER_SITE_OTHER): Payer: PRIVATE HEALTH INSURANCE | Admitting: Physician Assistant

## 2020-09-10 ENCOUNTER — Other Ambulatory Visit: Payer: Self-pay

## 2020-09-10 ENCOUNTER — Encounter: Payer: Self-pay | Admitting: Physician Assistant

## 2020-09-10 VITALS — BP 118/80 | HR 107 | Temp 98.3°F | Wt 182.4 lb

## 2020-09-10 DIAGNOSIS — M542 Cervicalgia: Secondary | ICD-10-CM

## 2020-09-10 MED ORDER — TIZANIDINE HCL 4 MG PO TABS: 4.0000 mg | ORAL_TABLET | Freq: Four times a day (QID) | ORAL | 0 refills | Status: DC | PRN

## 2020-09-10 MED ORDER — METHYLPREDNISOLONE 4 MG PO TBPK: ORAL_TABLET | ORAL | 0 refills | Status: DC

## 2020-09-10 NOTE — Progress Notes (Signed)
Patient presents to clinic today c/o 1 to 2 weeks of cervical neck pain associated with pain radiating into shoulders bilaterally and down her arm.  Denies any trauma or injury but does do a lot of heavy lifting and bending at her jaw.  Has noted some occasional tingling in her upper arms from laterally.  Denies any noted weakness or decreased grip strength.  Notes range of motion of her arms is within normal limits.  Has been taking her meloxicam without substantial improvement in her symptoms.  Past Medical History:  Diagnosis Date  . Adult ADHD    managed by Dr. Evelene Croon  . Anxiety and depression    Managed by Dr. Evelene Croon.  Marland Kitchen BRVO (branch retinal vein occlusion) 2014   R eye 2014--followed by Dr. Luciana Axe.  MRI brain 11/19/15 showed mildly advanced generalized atrophy for age, +chronic small vessel ischemic changes, no acute finding.  . Depression   . Family history of colon cancer    11/2017 colonoscopy NORM.  Recall 5 yrs (Dr. Rhea Belton)  . Postmenopausal vaginal bleeding 05/2017   Pelvic u/s 06/2017 with thickened endometrium---referred to GYN for endometrial biopsy.  . Shingles 06/07/2019   L waistline   . Stroke [facility]) 2014   rt.eye    Current Outpatient Medications on File Prior to Visit  Medication Sig Dispense Refill  . ALPRAZolam (XANAX) 1 MG tablet Take 1 mg by mouth 3 (three) times daily.    Marland Kitchen amphetamine-dextroamphetamine (ADDERALL) 20 MG tablet Take 20 mg by mouth 3 (three) times daily.    Marland Kitchen BIOTIN PO Take 1 tablet by mouth daily.    . cyclobenzaprine (FLEXERIL) 10 MG tablet TAKE 1 TABLET BY MOUTH TWICE A DAY AS NEEDED FOR MUSCLE SPASMS 60 tablet 1  . desvenlafaxine (PRISTIQ) 100 MG 24 hr tablet Take 100 mg by mouth every morning.    Marland Kitchen FML LIQUIFILM 0.1 % ophthalmic suspension Instill 1 drop into both eyes four times a day as directed QID OU X 1 WK, BID OU X 1 WK    . gabapentin (NEURONTIN) 400 MG capsule Take 400 mg by mouth in the morning, at noon, in the evening, and at bedtime.     . gabapentin (NEURONTIN) 600 MG tablet Take by mouth.    . hydrochlorothiazide (MICROZIDE) 12.5 MG capsule TAKE 1 CAPSULE BY MOUTH EVERY DAY 90 capsule 1  . meloxicam (MOBIC) 15 MG tablet TAKE 1 TABLET BY MOUTH EVERY DAY 90 tablet 0  . mirtazapine (REMERON) 30 MG tablet Take 30 mg by mouth at bedtime.    . Multiple Vitamins-Minerals (CENTRUM SILVER PO) Take 1 tablet daily by mouth.    . pantoprazole (PROTONIX) 40 MG tablet TAKE 1 TABLET BY MOUTH EVERY DAY 90 tablet 1  . tolterodine (DETROL LA) 2 MG 24 hr capsule TAKE 1 CAPSULE (2 MG TOTAL) BY MOUTH DAILY. (Patient taking differently: Take 2 mg by mouth 3 (three) times a week. ) 30 capsule 1  . traZODone (DESYREL) 50 MG tablet     . valACYclovir (VALTREX) 1000 MG tablet Take 2 tablets by mouth every 12 hours 4 tablet 0   No current facility-administered medications on file prior to visit.    Allergies  Allergen Reactions  . Penicillins Anaphylaxis  . Sulfa Antibiotics Anaphylaxis    Family History  Problem Relation Age of Onset  . Hyperlipidemia Mother   . Thyroid disease Mother   . Colon polyps Mother   . Heart attack Mother   . Heart disease  Mother   . Stroke Father   . Hypertension Father   . Hyperlipidemia Father   . Cancer Father        Lung  . Lung cancer Father   . Cancer Maternal Grandmother        colon cancer  . Healthy Sister   . Healthy Son   . Healthy Daughter   . Colon cancer Maternal Uncle   . Breast cancer Paternal Aunt   . Lung cancer Paternal Aunt   . Bone cancer Paternal Aunt   . Colon cancer Maternal Uncle   . Esophageal cancer Neg Hx   . Rectal cancer Neg Hx   . Stomach cancer Neg Hx     Social History   Socioeconomic History  . Marital status: Divorced    Spouse name: Not on file  . Number of children: Not on file  . Years of education: Not on file  . Highest education level: Not on file  Occupational History  . Not on file  Tobacco Use  . Smoking status: Current Some Day Smoker     Packs/day: 0.20    Years: 2.00    Pack years: 0.40    Types: Cigarettes  . Smokeless tobacco: Never Used  Vaping Use  . Vaping Use: Never used  Substance and Sexual Activity  . Alcohol use: Yes    Alcohol/week: 0.0 standard drinks    Comment: 1-3 beers a day  . Drug use: No  . Sexual activity: Not Currently    Birth control/protection: Abstinence  Other Topics Concern  . Not on file  Social History Narrative   She works in the house cleaning business   She is divorced for 9 years   Has two children - daughter goes back and forth between.   Tob: 1 pack q 3d--current as of 02/2017.   Alc: 12 pack per week.   Right handed   One story home   Drinks caffeine   Social Determinants of Health   Financial Resource Strain:   . Difficulty of Paying Living Expenses: Not on file  Food Insecurity:   . Worried About Programme researcher, broadcasting/film/video in the Last Year: Not on file  . Ran Out of Food in the Last Year: Not on file  Transportation Needs:   . Lack of Transportation (Medical): Not on file  . Lack of Transportation (Non-Medical): Not on file  Physical Activity:   . Days of Exercise per Week: Not on file  . Minutes of Exercise per Session: Not on file  Stress:   . Feeling of Stress : Not on file  Social Connections:   . Frequency of Communication with Friends and Family: Not on file  . Frequency of Social Gatherings with Friends and Family: Not on file  . Attends Religious Services: Not on file  . Active Member of Clubs or Organizations: Not on file  . Attends Banker Meetings: Not on file  . Marital Status: Not on file   Review of Systems - See HPI.  All other ROS are negative.  Wt 182 lb 6.4 oz (82.7 kg)   LMP 12/11/2013 Comment: several years ago  BMI 31.31 kg/m   Physical Exam Vitals reviewed.  Constitutional:      Appearance: Normal appearance.  HENT:     Head: Normocephalic and atraumatic.  Cardiovascular:     Rate and Rhythm: Normal rate and regular  rhythm.     Heart sounds: Normal heart sounds.  Musculoskeletal:  Cervical back: Neck supple. Spasms and tenderness present. Muscular tenderness present. No spinous process tenderness. Normal range of motion.  Neurological:     General: No focal deficit present.     Mental Status: She is alert and oriented to person, place, and time.     Recent Results (from the past 2160 hour(s))  Vitamin B12     Status: Abnormal   Collection Time: 07/28/20  1:46 PM  Result Value Ref Range   Vitamin B-12 1,278 (H) 211 - 911 pg/mL    Assessment/Plan: 1. Cervicalgia Seems likely combination of muscle strain/spasm and nerve irritation.  We will have her avoid heavy lifting and overexertion.  Will stop meloxicam and have her start Medrol Dosepak.  We will have her switch her Flexeril for tizanidine.  Heat recommended.  If not improving will recommend sports medicine/physical therapy assessment as well as proceeding with imaging.  Patient voiced understanding and agreement with plan. - methylPREDNISolone (MEDROL DOSEPAK) 4 MG TBPK tablet; Take following package directions  Dispense: 21 tablet; Refill: 0 - tiZANidine (ZANAFLEX) 4 MG tablet; Take 1 tablet (4 mg total) by mouth every 6 (six) hours as needed for muscle spasms.  Dispense: 30 tablet; Refill: 0  This visit occurred during the SARS-CoV-2 public health emergency.  Safety protocols were in place, including screening questions prior to the visit, additional usage of staff PPE, and extensive cleaning of exam room while observing appropriate contact time as indicated for disinfecting solutions.     Piedad Climes, PA-C

## 2020-09-10 NOTE — Patient Instructions (Signed)
Please avoid heavy lifting and overuse of arms. Continue use of heating pad. Stop the Meloxicam and Flexeril. Take the Medrol dose pack and Tizanidine as directed. Can use Aleve and tylenol for breakthrough pain.   If not improving I want to get imaging and have you see Sports Medicine/Physical Therapy.  Hang in there!

## 2020-10-01 ENCOUNTER — Telehealth: Payer: Self-pay | Admitting: Physician Assistant

## 2020-10-01 DIAGNOSIS — B001 Herpesviral vesicular dermatitis: Secondary | ICD-10-CM

## 2020-10-01 NOTE — Telephone Encounter (Signed)
Pt called in stating that she is had a cold sore pop up and she wanted to know if Haley Martin could call something in for [facility] to [facility]. Pt can be reached at the cell #

## 2020-10-02 MED ORDER — VALACYCLOVIR HCL 1 G PO TABS: ORAL_TABLET | ORAL | 0 refills | Status: DC

## 2020-10-02 NOTE — Telephone Encounter (Signed)
Advised patient that rx of Valtrex sent to the [facility] pharmacy for cold sores

## 2020-10-02 NOTE — Telephone Encounter (Signed)
Rx Valtrex sent in to her pharmacy to take as directed.

## 2020-10-06 ENCOUNTER — Telehealth: Payer: Self-pay | Admitting: Physician Assistant

## 2020-10-06 ENCOUNTER — Telehealth: Payer: Self-pay | Admitting: Internal Medicine

## 2020-10-06 ENCOUNTER — Other Ambulatory Visit: Payer: Self-pay | Admitting: Physician Assistant

## 2020-10-06 DIAGNOSIS — M542 Cervicalgia: Secondary | ICD-10-CM

## 2020-10-06 NOTE — Telephone Encounter (Signed)
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  Pt can be reached at the home # and [facility] [place].  Please advise.

## 2020-10-06 NOTE — Telephone Encounter (Signed)
Pt called in asking if it would be ok for her to take Bayer aspirin back and body for daily PRN back and shoulder pain.   Please advise she is aware that Selena Batten is out of the office

## 2020-10-06 NOTE — Telephone Encounter (Signed)
Is this medication ok to continue for patient? Please advise

## 2020-10-06 NOTE — Telephone Encounter (Signed)
Prep instructions reviewed over the phone with pt and questions were answered.

## 2020-10-07 ENCOUNTER — Ambulatory Visit (AMBULATORY_SURGERY_CENTER): Payer: PRIVATE HEALTH INSURANCE | Admitting: Internal Medicine

## 2020-10-07 ENCOUNTER — Encounter: Payer: Self-pay | Admitting: Internal Medicine

## 2020-10-07 ENCOUNTER — Other Ambulatory Visit: Payer: Self-pay

## 2020-10-07 VITALS — BP 133/76 | HR 95 | Temp 97.1°F | Resp 22 | Ht 64.0 in | Wt 185.0 lb

## 2020-10-07 DIAGNOSIS — K2970 Gastritis, unspecified, without bleeding: Secondary | ICD-10-CM

## 2020-10-07 DIAGNOSIS — K259 Gastric ulcer, unspecified as acute or chronic, without hemorrhage or perforation: Secondary | ICD-10-CM

## 2020-10-07 DIAGNOSIS — K2289 Other specified disease of esophagus: Secondary | ICD-10-CM

## 2020-10-07 DIAGNOSIS — K3189 Other diseases of stomach and duodenum: Secondary | ICD-10-CM

## 2020-10-07 DIAGNOSIS — R1319 Other dysphagia: Secondary | ICD-10-CM

## 2020-10-07 DIAGNOSIS — K319 Disease of stomach and duodenum, unspecified: Secondary | ICD-10-CM

## 2020-10-07 MED ORDER — SODIUM CHLORIDE 0.9 % IV SOLN: 500.0000 mL | Freq: Once | INTRAVENOUS | Status: DC

## 2020-10-07 NOTE — Progress Notes (Signed)
VS taken by C.W. 

## 2020-10-07 NOTE — Telephone Encounter (Signed)
Yes she should be able to take as directed on OTC labeling. If causing stomach upset, she needs to stop. Could also try instead to take a daily Turmeric 500 mg supplement OTC.

## 2020-10-07 NOTE — Op Note (Signed)
[facility] Patient Name: Haley Martin Procedure Date: 10/07/2020 10:07 AM MRN: [id number] Endoscopist: Beverley Fiedler , MD Age: 54 Referring MD:  Date of Birth: Jan 15, 1966 Gender: Female Account #: [id number] Procedure:                Upper GI endoscopy Indications:              Dysphagia Medicines:                Monitored Anesthesia Care Procedure:                Pre-Anesthesia Assessment:                           - Prior to the procedure, a History and Physical                            was performed, and patient medications and                            allergies were reviewed. The patient's tolerance of                            previous anesthesia was also reviewed. The risks                            and benefits of the procedure and the sedation                            options and risks were discussed with the patient.                            All questions were answered, and informed consent                            was obtained. Prior Anticoagulants: The patient has                            taken no previous anticoagulant or antiplatelet                            agents. ASA Grade Assessment: II - A patient with                            mild systemic disease. After reviewing the risks                            and benefits, the patient was deemed in                            satisfactory condition to undergo the procedure.                           After obtaining informed consent, the endoscope was  passed under direct vision. Throughout the                            procedure, the patient's blood pressure, pulse, and                            oxygen saturations were monitored continuously. The                            Endoscope was introduced through the mouth, and                            advanced to the second part of duodenum. The upper                            GI endoscopy was accomplished without  difficulty.                            The patient tolerated the procedure well. Scope In: Scope Out: Findings:                 Normal mucosa was found in the entire esophagus.                            Multiple biopsies were obtained with cold forceps                            (during reinspection after dilation) for evaluation                            of eosinophilic esophagitis in the proximal                            esophagus and in the distal esophagus.                           No endoscopic abnormality was evident in the                            esophagus to explain the patient's complaint of                            dysphagia. It was decided, however, to proceed with                            dilation of the entire esophagus. The scope was                            withdrawn. Dilation was performed with a Maloney                            dilator with mild resistance at 52 Fr. The dilation  site was examined following endoscope reinsertion                            and showed no change.                           Three non-bleeding cratered gastric ulcers with no                            stigmata of bleeding were found in the gastric                            antrum. There was associated gastritis. The largest                            lesion was 6 mm in largest dimension. Biopsies were                            taken with a cold forceps for histology and                            Helicobacter pylori testing (gastric body, antrum,                            incisura).                           The cardia and gastric fundus were normal on                            retroflexion.                           The examined duodenum was normal. Complications:            No immediate complications. Estimated Blood Loss:     Estimated blood loss was minimal. Impression:               - Normal mucosa was found in the entire esophagus.                             Multiple biopsies were obtained in the proximal                            esophagus and in the distal esophagus.                           - No endoscopic esophageal abnormality to explain                            patient's dysphagia. Esophagus dilated with 52 Fr                            Maloney.                           -  Non-bleeding gastric ulcers with no stigmata of                            bleeding. Biopsied.                           - Normal examined duodenum. Recommendation:           - Patient has a contact number available for                            emergencies. The signs and symptoms of potential                            delayed complications were discussed with the                            patient. Return to normal activities tomorrow.                            Written discharge instructions were provided to the                            patient.                           - Resume previous diet.                           - Continue present medications. Avoid NSAIDs given                            gastric ulcers. Continue pantoprazole 40 mg daily.                           - Await pathology results.                           - If not improvement in esophageal dysphagia                            (swallowing) please contact my office for follow-up                            appointment. Beverley FiedlerJay M Rogenia Werntz, MD 10/07/2020 10:29:19 AM This report has been signed electronically.

## 2020-10-07 NOTE — Progress Notes (Signed)
Called to room to assist during endoscopic procedure.  Patient ID and intended procedure confirmed with present staff. Received instructions for my participation in the procedure from the performing physician.  

## 2020-10-07 NOTE — Progress Notes (Signed)
A and O x3. Report to RN. Tolerated MAC anesthesia well.Teeth unchanged after procedure.

## 2020-10-07 NOTE — Telephone Encounter (Signed)
Called patient with PCP recommendations. Patient voiced understanding. Patient states she just got back from Dr. Lauro Franklin office from getting her esophagus stretched. She states while having that done they found some ulcers. Patient states she was instructed to not take anything besides tylenol. (FYI)

## 2020-10-07 NOTE — Patient Instructions (Signed)
Avoid all non steroidal anti inflammatory drugs. Per Dr. Rhea Belton resume normal diet today.     YOU HAD AN ENDOSCOPIC PROCEDURE TODAY AT THE [facility]:   Refer to the procedure report that was given to you for any specific questions about what was found during the examination.  If the procedure report does not answer your questions, please call your gastroenterologist to clarify.  If you requested that your care partner not be given the details of your procedure findings, then the procedure report has been included in a sealed envelope for you to review at your convenience later.  YOU SHOULD EXPECT: Some feelings of bloating in the abdomen. Passage of more gas than usual.  Walking can help get rid of the air that was put into your GI tract during the procedure and reduce the bloating. If you had a lower endoscopy (such as a colonoscopy or flexible sigmoidoscopy) you may notice spotting of blood in your stool or on the toilet paper. If you underwent a bowel prep for your procedure, you may not have a normal bowel movement for a few days.  Please Note:  You might notice some irritation and congestion in your nose or some drainage.  This is from the oxygen used during your procedure.  There is no need for concern and it should clear up in a day or so.  SYMPTOMS TO REPORT IMMEDIATELY:    Following upper endoscopy (EGD)  Vomiting of blood or coffee ground material  New chest pain or pain under the shoulder blades  Painful or persistently difficult swallowing  New shortness of breath  Fever of 100F or higher  Black, tarry-looking stools  For urgent or emergent issues, a gastroenterologist can be reached at any hour by calling [phone]. Do not use MyChart messaging for urgent concerns.    DIET:  We do recommend a small meal at first, but then you may proceed to your regular diet.  Drink plenty of fluids but you should avoid alcoholic beverages for 24 hours.  ACTIVITY:  You  should plan to take it easy for the rest of today and you should NOT DRIVE or use heavy machinery until tomorrow (because of the sedation medicines used during the test).    FOLLOW UP: Our staff will call the number listed on your records 48-72 hours following your procedure to check on you and address any questions or concerns that you may have regarding the information given to you following your procedure. If we do not reach you, we will leave a message.  We will attempt to reach you two times.  During this call, we will ask if you have developed any symptoms of COVID 19. If you develop any symptoms (ie: fever, flu-like symptoms, shortness of breath, cough etc.) before then, please call [phone].  If you test positive for Covid 19 in the 2 weeks post procedure, please call and report this information to [place].    If any biopsies were taken you will be contacted by phone or by letter within the next 1-3 weeks.  Please call us at [phone] if you have not heard about the biopsies in 3 weeks.    SIGNATURES/CONFIDENTIALITY: You and/or your care partner have signed paperwork which will be entered into your electronic medical record.  These signatures attest to the fact that that the information above on your After Visit Summary has been reviewed and is understood.  Full responsibility of the confidentiality of this discharge information lies  with you and/or your care-partner.

## 2020-10-09 ENCOUNTER — Telehealth: Payer: Self-pay | Admitting: *Deleted

## 2020-10-09 ENCOUNTER — Telehealth: Payer: Self-pay

## 2020-10-09 ENCOUNTER — Telehealth: Payer: Self-pay | Admitting: Physician Assistant

## 2020-10-09 NOTE — Telephone Encounter (Signed)
Patient called and wanted to know if you thought a therapeutic massage would help her - please advise

## 2020-10-09 NOTE — Telephone Encounter (Signed)
I assume patient is wanting the therapeutic massage for her Cervicalgia.

## 2020-10-09 NOTE — Telephone Encounter (Signed)
  Follow up Call-  Call back number 10/07/2020  Post procedure Call Back phone  # [phone]  Permission to leave phone message Yes  Some recent data might be hidden     2nd follow up call made.  NALM

## 2020-10-09 NOTE — Telephone Encounter (Signed)
  Follow up Call-  Call back number 10/07/2020  Post procedure Call Back phone  # [phone]  Permission to leave phone message Yes  Some recent data might be hidden     Patient questions:  Message left to call us if necessary.

## 2020-10-10 NOTE — Telephone Encounter (Signed)
Can be beneficial for muscle tension/spasm. Does not help with radicular symptoms that she was having at time of her visit with me so if there is still radiation of pain down the arms after steroid then we need to get her in with a specialist.

## 2020-10-16 ENCOUNTER — Encounter: Payer: Self-pay | Admitting: Internal Medicine

## 2020-10-16 DIAGNOSIS — K208 Other esophagitis without bleeding: Secondary | ICD-10-CM | POA: Insufficient documentation

## 2020-10-20 ENCOUNTER — Other Ambulatory Visit: Payer: Self-pay

## 2020-10-20 ENCOUNTER — Ambulatory Visit (INDEPENDENT_AMBULATORY_CARE_PROVIDER_SITE_OTHER): Payer: PRIVATE HEALTH INSURANCE | Admitting: Physician Assistant

## 2020-10-20 ENCOUNTER — Encounter: Payer: Self-pay | Admitting: Physician Assistant

## 2020-10-20 VITALS — BP 122/84 | HR 102 | Temp 98.5°F | Resp 16 | Ht 64.0 in | Wt 188.0 lb

## 2020-10-20 DIAGNOSIS — M542 Cervicalgia: Secondary | ICD-10-CM

## 2020-10-20 DIAGNOSIS — M791 Myalgia, unspecified site: Secondary | ICD-10-CM

## 2020-10-20 MED ORDER — TIZANIDINE HCL 4 MG PO TABS: 4.0000 mg | ORAL_TABLET | Freq: Four times a day (QID) | ORAL | 0 refills | Status: DC | PRN

## 2020-10-20 NOTE — Progress Notes (Signed)
Patient presents to clinic today c/o ongoing cervical neck tension and radiation into shoulders bilaterally. Continues to note upper arm soreness and tension with spasm. Takes Zanaflex PRN which does seem to help. Denies pain radiating into distal upper extremities. 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   Past Medical History:  Diagnosis Date  . Adult ADHD    managed by Dr. Evelene Croon  . Anxiety and depression    Managed by Dr. Evelene Croon.  Marland Kitchen BRVO (branch retinal vein occlusion) 2014   R eye 2014--followed by Dr. Luciana Axe.  MRI brain 11/19/15 showed mildly advanced generalized atrophy for age, +chronic small vessel ischemic changes, no acute finding.  . Depression   . Family history of colon cancer    11/2017 colonoscopy NORM.  Recall 5 yrs (Dr. Rhea Belton)  . Postmenopausal vaginal bleeding 05/2017   Pelvic u/s 06/2017 with thickened endometrium---referred to GYN for endometrial biopsy.  . Shingles 06/07/2019   L waistline   . Stroke [facility]) 2014   rt.eye    Current Outpatient Medications on File Prior to Visit  Medication Sig Dispense Refill  . ALPRAZolam (XANAX) 1 MG tablet Take 1 mg by mouth 3 (three) times daily.    Marland Kitchen amphetamine-dextroamphetamine (ADDERALL) 20 MG tablet Take 20 mg by mouth 3 (three) times daily.    Marland Kitchen conjugated estrogens (PREMARIN) vaginal cream Discard plastic applicator. Insert blueberry size amount of cream on finger in vagina daily x1 week then 2x per week.    . desvenlafaxine (PRISTIQ) 100 MG 24 hr tablet Take 100 mg by mouth every morning.    . gabapentin (NEURONTIN) 400 MG capsule Take 400 mg by mouth in the morning, at noon, in the evening, and at bedtime.    . gabapentin (NEURONTIN) 600 MG tablet Take by mouth.    . hydrochlorothiazide (MICROZIDE) 12.5 MG capsule TAKE 1 CAPSULE BY MOUTH EVERY DAY 90 capsule 1  . mirtazapine (REMERON) 30 MG tablet Take 30 mg by mouth at bedtime.    . Multiple Vitamins-Minerals (CENTRUM SILVER PO) Take 1 tablet daily by  mouth.    . pantoprazole (PROTONIX) 40 MG tablet TAKE 1 TABLET BY MOUTH EVERY DAY 90 tablet 1  . tiZANidine (ZANAFLEX) 4 MG tablet TAKE 1 TABLET (4 MG TOTAL) BY MOUTH EVERY 6 (SIX) HOURS AS NEEDED FOR MUSCLE SPASMS. 30 tablet 0  . traZODone (DESYREL) 50 MG tablet     . valACYclovir (VALTREX) 1000 MG tablet Take 2 tablets by mouth every 12 hours 4 tablet 0   No current facility-administered medications on file prior to visit.    Allergies  Allergen Reactions  . Penicillins Anaphylaxis  . Sulfa Antibiotics Anaphylaxis    Family History  Problem Relation Age of Onset  . Hyperlipidemia Mother   . Thyroid disease Mother   . Colon polyps Mother   . Heart attack Mother   . Heart disease Mother   . Stroke Father   . Hypertension Father   . Hyperlipidemia Father   . Cancer Father        Lung  . Lung cancer Father   . Cancer Maternal Grandmother        colon cancer  . Healthy Sister   . Healthy Son   . Healthy Daughter   . Colon cancer Maternal Uncle   . Breast cancer Paternal Aunt   . Lung cancer Paternal Aunt   . Bone cancer Paternal Aunt   . Colon cancer Maternal Uncle   . Esophageal  cancer Neg Hx   . Rectal cancer Neg Hx   . Stomach cancer Neg Hx     Social History   Socioeconomic History  . Marital status: Divorced    Spouse name: Not on file  . Number of children: Not on file  . Years of education: Not on file  . Highest education level: Not on file  Occupational History  . Not on file  Tobacco Use  . Smoking status: Current Some Day Smoker    Packs/day: 0.20    Years: 2.00    Pack years: 0.40    Types: Cigarettes  . Smokeless tobacco: Never Used  Vaping Use  . Vaping Use: Never used  Substance and Sexual Activity  . Alcohol use: Yes    Alcohol/week: 0.0 standard drinks    Comment: 1-3 beers a day  . Drug use: No  . Sexual activity: Not Currently    Birth control/protection: Abstinence  Other Topics Concern  . Not on file  Social History Narrative    She works in the house cleaning business   She is divorced for 9 years   Has two children - daughter goes back and forth between.   Tob: 1 pack q 3d--current as of 02/2017.   Alc: 12 pack per week.   Right handed   One story home   Drinks caffeine   Social Determinants of Health   Financial Resource Strain:   . Difficulty of Paying Living Expenses: Not on file  Food Insecurity:   . Worried About Programme researcher, broadcasting/film/video in the Last Year: Not on file  . Ran Out of Food in the Last Year: Not on file  Transportation Needs:   . Lack of Transportation (Medical): Not on file  . Lack of Transportation (Non-Medical): Not on file  Physical Activity:   . Days of Exercise per Week: Not on file  . Minutes of Exercise per Session: Not on file  Stress:   . Feeling of Stress : Not on file  Social Connections:   . Frequency of Communication with Friends and Family: Not on file  . Frequency of Social Gatherings with Friends and Family: Not on file  . Attends Religious Services: Not on file  . Active Member of Clubs or Organizations: Not on file  . Attends Banker Meetings: Not on file  . Marital Status: Not on file   Review of Systems - See HPI.  All other ROS are negative.  BP 122/84   Pulse (!) 102   Temp 98.5 F (36.9 C) (Temporal)   Resp 16   Ht 5\' 4"  (1.626 m)   Wt 188 lb (85.3 kg)   LMP 12/11/2013 Comment: several years ago  SpO2 99%   BMI 32.27 kg/m   Physical Exam Vitals reviewed.  Constitutional:      Appearance: Normal appearance.  HENT:     Head: Normocephalic and atraumatic.  Cardiovascular:     Rate and Rhythm: Normal rate and regular rhythm.     Pulses: Normal pulses.  Pulmonary:     Effort: Pulmonary effort is normal.     Breath sounds: Normal breath sounds.  Musculoskeletal:     Cervical back: Neck supple.  Neurological:     General: No focal deficit present.     Mental Status: She is alert and oriented to person, place, and time.  Psychiatric:         Mood and Affect: Mood normal.    Recent Results (from  the past 2160 hour(s))  Vitamin B12     Status: Abnormal   Collection Time: 07/28/20  1:46 PM  Result Value Ref Range   Vitamin B-12 1,278 (H) 211 - 911 pg/mL   Assessment/Plan: 1. Cervicalgia 2. Muscle tension pain Ongoing. Suspect overuse injuries and tension. Will refer to Sports Medicine. Supportive measures and OTC medications reviewed. Rx Zanaflex.   This visit occurred during the SARS-CoV-2 public health emergency.  Safety protocols were in place, including screening questions prior to the visit, additional usage of staff PPE, and extensive cleaning of exam room while observing appropriate contact time as indicated for disinfecting solutions.     Piedad Climes, PA-C

## 2020-10-20 NOTE — Progress Notes (Signed)
Subjective:    I'm seeing this patient as a consultation for:  Haley Mates, PA-C. Note will be routed back to referring provider/PCP.  CC: Neck pain and B shoulder pain  I, Haley Martin, LAT, ATC, am serving as scribe for Dr. Clementeen Martin.  HPI: Pt is a 54 y/o female presenting w/ c/o neck pain radiating into her B shoulders and upper arms x 3 months w/ no known MOI.  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  She denies any injury that might of caused her pain.  Radiating pain: yes into B shoulder and upper arms, L>R UE numbness/tingling: yes in her B hands (has hx of carpal tunnel) UE weakness: yes in her B upper arm Aggravating factors: Nothing in particular Treatments tried: Zanaflex; Voltaren; ExtraStrength Tylenol  Past medical history, Surgical history, Family history, Social history, Allergies, and medications have been entered into the medical record, reviewed. History appendectomy  Review of Systems: No new headache, visual changes, nausea, vomiting, diarrhea, constipation, dizziness, abdominal pain, skin rash, fevers, chills, night sweats, weight loss, swollen lymph nodes, body aches, joint swelling, muscle aches, chest pain, shortness of breath, mood changes, visual or auditory hallucinations.   Objective:    Vitals:   10/21/20 1329  BP: (!) 160/110  Pulse: (!) 115  SpO2: 98%   General: Well Developed, well nourished, and in no acute distress.  Neuro/Psych: Alert and oriented x3, extra-ocular muscles intact, able to move all 4 extremities, sensation grossly intact. Skin: Warm and dry, no rashes noted.  Respiratory: Not using accessory muscles, speaking in full sentences, trachea midline.  Cardiovascular: Pulses palpable, no extremity edema. Abdomen: Does not appear distended. MSK: C-spine normal-appearing nontender midline.  Tender palpation bilateral trapezius. Normal cervical motion. Upper extremity strength reflexes and sensation are intact  distally. Pain with shoulder shrug and resisted arm abduction present. Pulses cap refill and sensation are intact distally. Reflexes are intact distal bilateral extremities.  Lab and Radiology Results X-ray images C-spine obtained today personally and independently interpreted. Mild DDD.  Some loss of cervical lordosis indicating spasm.  No acute fractures. Await formal radiology review  Impression and Recommendations:    Assessment and Plan: 54 y.o. female with chronic neck pain without much radiculopathy.  Likely secondary to muscle spasm and dysfunction.  Patient should benefit from physical therapy.  Fortunately she does do pretty well with tizanidine.  Recommend continue tizanidine.  Refer to physical therapy.  Recommend heating pad and TENS unit as well.  She does have some proximal muscle pain and dysfunction we will go ahead and do a limited dermatomyositis work-up with sed rate and CK.  Recently in May basic metabolic work-up was normal.  Plan for recheck and reevaluation in 6 to 8 weeks.  Recheck sooner if needed.  PDMP not reviewed this encounter. Orders Placed This Encounter  Procedures  . DG Cervical Spine 2 or 3 views    Standing Status:   Future    Number of Occurrences:   1    Standing Expiration Date:   10/21/2021    Order Specific Question:   Reason for Exam (SYMPTOM  OR DIAGNOSIS REQUIRED)    Answer:   eval neck pain    Order Specific Question:   Is patient pregnant?    Answer:   No    Order Specific Question:   Preferred imaging location?    Answer:   [facility]  . Sedimentation rate    Standing Status:   Future  Number of Occurrences:   1    Standing Expiration Date:   10/21/2021  . CK    Standing Status:   Future    Number of Occurrences:   1    Standing Expiration Date:   10/21/2021  . Ambulatory referral to Physical Therapy    Referral Priority:   Routine    Referral Type:   Physical Medicine    Referral Reason:   Specialty Services Required     Requested Specialty:   Physical Therapy    Number of Visits Requested:   1   No orders of the defined types were placed in this encounter.   Discussed warning signs or symptoms. Please see discharge instructions. Patient expresses understanding.   The above documentation has been reviewed and is accurate and complete Haley Martin, M.D.

## 2020-10-20 NOTE — Patient Instructions (Signed)
You will be contacted for assessment by Sports Medicine for further evaluation and management.  Try to rest when you are able. Limit heavy lifting and overexertion. I have refilled Tizanidine with an adjustment in monthly quantity.  Continue Voltaren. Tylenol ES for breakthrough pain.

## 2020-10-21 ENCOUNTER — Ambulatory Visit (INDEPENDENT_AMBULATORY_CARE_PROVIDER_SITE_OTHER): Payer: PRIVATE HEALTH INSURANCE

## 2020-10-21 ENCOUNTER — Ambulatory Visit (INDEPENDENT_AMBULATORY_CARE_PROVIDER_SITE_OTHER): Payer: PRIVATE HEALTH INSURANCE | Admitting: Family Medicine

## 2020-10-21 ENCOUNTER — Encounter: Payer: Self-pay | Admitting: Family Medicine

## 2020-10-21 VITALS — BP 160/110 | HR 115 | Ht 64.0 in | Wt 187.4 lb

## 2020-10-21 DIAGNOSIS — M62838 Other muscle spasm: Secondary | ICD-10-CM | POA: Diagnosis not present

## 2020-10-21 DIAGNOSIS — M542 Cervicalgia: Secondary | ICD-10-CM | POA: Diagnosis not present

## 2020-10-21 DIAGNOSIS — M791 Myalgia, unspecified site: Secondary | ICD-10-CM | POA: Diagnosis not present

## 2020-10-21 LAB — SEDIMENTATION RATE: Sed Rate: 13 mm/hr (ref 0–30)

## 2020-10-21 LAB — CK: Total CK: 65 U/L (ref 7–177)

## 2020-10-21 NOTE — Patient Instructions (Addendum)
Thank you for coming in today.  Plan for PT.  I've referred you to Physical Therapy.  Let us know if you don't hear from them in one week.  Please get an Xray today before you leave  Please get labs today before you leave   Continue tizanidine as needed.   Heating pad may help.   TENS UNIT: This is helpful for muscle pain and spasm.   Search and Purchase a TENS 7000 2nd edition at  www.tenspros.com or www.Amazon.com It should be less than $30.     TENS unit instructions: Do not shower or bathe with the unit on . Turn the unit off before removing electrodes or batteries . If the electrodes lose stickiness add a drop of water to the electrodes after they are disconnected from the unit and place on plastic sheet. If you continued to have difficulty, call the TENS unit company to purchase more electrodes. . Do not apply lotion on the skin area prior to use. Make sure the skin is clean and dry as this will help prolong the life of the electrodes. . After use, always check skin for unusual red areas, rash or other skin difficulties. If there are any skin problems, does not apply electrodes to the same area. . Never remove the electrodes from the unit by pulling the wires. . Do not use the TENS unit or electrodes other than as directed. . Do not change electrode placement without consultating your therapist or physician. Marland Kitchen Keep 2 fingers with between each electrode. . Wear time ratio is 2:1, on to off times.    For example on for 30 minutes off for 15 minutes and then on for 30 minutes off for 15 minutes   Recheck with me in 6-8 week especially if not improving.  Let me know sooner if you a have a problem.

## 2020-10-22 NOTE — Progress Notes (Signed)
X-ray cervical spine shows some arthritis at C5-C6 otherwise looks normal

## 2020-10-22 NOTE — Progress Notes (Signed)
Muscle inflammation labs are normal

## 2020-10-24 ENCOUNTER — Ambulatory Visit: Payer: PRIVATE HEALTH INSURANCE | Admitting: Family Medicine

## 2020-10-24 DIAGNOSIS — M62838 Other muscle spasm: Secondary | ICD-10-CM

## 2020-10-24 DIAGNOSIS — M542 Cervicalgia: Secondary | ICD-10-CM

## 2020-10-28 ENCOUNTER — Other Ambulatory Visit: Payer: Self-pay | Admitting: Physician Assistant

## 2020-10-28 DIAGNOSIS — M542 Cervicalgia: Secondary | ICD-10-CM

## 2020-10-28 DIAGNOSIS — M791 Myalgia, unspecified site: Secondary | ICD-10-CM

## 2020-10-29 ENCOUNTER — Telehealth: Payer: Self-pay | Admitting: Physician Assistant

## 2020-10-29 NOTE — Telephone Encounter (Signed)
Patient states that she is still having pain in her right toe - she has tried to get in touch with Dr. Carlynn Spry with [facility] but they will not return her call.  Patient is still trying to get someone to take her insurance for physical therapy on her neck - Please advise.

## 2020-10-29 NOTE — Telephone Encounter (Signed)
She needs to continue reaching out the her Podiatrist who has been evaluating and managing.

## 2020-10-30 NOTE — Telephone Encounter (Signed)
Spoke with patient today. She states she has left several messages at [facility] about her great right toe. She is having pain and swelling. Advised patient Podiatrist would need to re-evaluate since they are treating and managing the toe. Please reach out to patient for an appointment.

## 2020-11-03 NOTE — Telephone Encounter (Signed)
Haley Martin, patient will need to be seen as it has been over 2 months since we have evaluated. Please schedule next week

## 2020-11-03 NOTE — Telephone Encounter (Signed)
LVM for patient to call the office to schedule an appointment. I will follow up making sure this pt gets scheduled.

## 2020-11-05 ENCOUNTER — Encounter: Payer: Self-pay | Admitting: Physician Assistant

## 2020-11-05 ENCOUNTER — Other Ambulatory Visit: Payer: Self-pay | Admitting: Physician Assistant

## 2020-11-05 DIAGNOSIS — N3281 Overactive bladder: Secondary | ICD-10-CM

## 2020-11-05 NOTE — Telephone Encounter (Signed)
Can well call the [facility] and see about facilitating an appointment for patient? Thank you.

## 2020-11-12 ENCOUNTER — Encounter: Payer: Self-pay | Admitting: Physician Assistant

## 2020-11-12 ENCOUNTER — Other Ambulatory Visit: Payer: Self-pay | Admitting: Physician Assistant

## 2020-11-12 DIAGNOSIS — M791 Myalgia, unspecified site: Secondary | ICD-10-CM

## 2020-11-12 DIAGNOSIS — M542 Cervicalgia: Secondary | ICD-10-CM

## 2020-11-20 ENCOUNTER — Other Ambulatory Visit: Payer: Self-pay | Admitting: Physician Assistant

## 2020-11-20 DIAGNOSIS — M542 Cervicalgia: Secondary | ICD-10-CM

## 2020-11-20 DIAGNOSIS — M791 Myalgia, unspecified site: Secondary | ICD-10-CM

## 2020-11-26 ENCOUNTER — Ambulatory Visit: Payer: PRIVATE HEALTH INSURANCE | Admitting: Podiatry

## 2020-12-04 ENCOUNTER — Other Ambulatory Visit: Payer: Self-pay | Admitting: Physician Assistant

## 2020-12-04 DIAGNOSIS — M545 Low back pain, unspecified: Secondary | ICD-10-CM

## 2020-12-09 NOTE — Progress Notes (Signed)
   I, Christoper Fabian, LAT, ATC, am serving as scribe for Dr. [name].  Haley Martin is a 54 y.o. female who presents to [facility] at [facility] today for f/u trapezius muscle spasm and chronic neck pain. Pt reported pain has been ongoing for 4 months w/ no known MOI. 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 Pt was last seen by Dr. [name] on 10/21/20 and was referred to PT of which she's completed 5 visits and was advised to continue tizanidine, heating pad, and TENS unit. Today, pt reports that she is feeling somewhat better.  She rates her improvement at 60%.  She states that she went to PT today and had dry needling.  She reports some spasms in her L upper trap currently.  She con't to have the most issue w/ pain and spasms in her B upper traps w/ radiating pain running into her B upper arms.   Dx imaging: 10/21/20 C-spine XR  Pertinent review of systems: No fevers or chills  Relevant historical information: Lymphocytic esophagitis   Exam:  BP (!) 140/98 (BP Location: Left Arm, Patient Position: Sitting, Cuff Size: Normal)   Pulse 87   Ht 5\' 4"  (1.626 m)   Wt 180 lb (81.6 kg)   LMP 12/11/2013 Comment: several years ago  SpO2 98%   BMI 30.90 kg/m  General: Well Developed, well nourished, and in no acute distress.   MSK: C-spine normal-appearing nontender midline.  Tender palpation bilateral trapezius especially at insertion onto left superior scapular border. Normal cervical motion. Upper extremity motion and strength are intact.      Assessment and Plan: 54 y.o. female with persistent neck and trapezius pain.  Significant improvement with physical therapy approximately 6%.  Still having some persistent trapezius pain.  Discussed options.  Plan to continue PT and home exercise program.  Check back in a month.  If not better either we will proceed with MRI or more likely trial of steroid injection at focal points of tenderness at trapezius insertion  site.    Discussed warning signs or symptoms. Please see discharge instructions. Patient expresses understanding. The above documentation has been reviewed and is accurate and complete 57, M.D.

## 2020-12-10 ENCOUNTER — Other Ambulatory Visit: Payer: Self-pay

## 2020-12-10 ENCOUNTER — Encounter: Payer: Self-pay | Admitting: Family Medicine

## 2020-12-10 ENCOUNTER — Ambulatory Visit (INDEPENDENT_AMBULATORY_CARE_PROVIDER_SITE_OTHER): Payer: PRIVATE HEALTH INSURANCE | Admitting: Family Medicine

## 2020-12-10 VITALS — BP 140/98 | HR 87 | Ht 64.0 in | Wt 180.0 lb

## 2020-12-10 DIAGNOSIS — M542 Cervicalgia: Secondary | ICD-10-CM

## 2020-12-10 DIAGNOSIS — M62838 Other muscle spasm: Secondary | ICD-10-CM

## 2020-12-10 NOTE — Patient Instructions (Signed)
Thank you for coming in today.  Recheck in about 1 month.   Continue current treatment with home exercises, PT and TENS unit.

## 2020-12-17 ENCOUNTER — Other Ambulatory Visit: Payer: Self-pay | Admitting: Physician Assistant

## 2020-12-17 DIAGNOSIS — M791 Myalgia, unspecified site: Secondary | ICD-10-CM

## 2020-12-17 DIAGNOSIS — M542 Cervicalgia: Secondary | ICD-10-CM

## 2020-12-17 NOTE — Telephone Encounter (Signed)
LFD 11/12/20 #60 with no refills LOV 10/20/20 NOV none

## 2020-12-25 ENCOUNTER — Other Ambulatory Visit: Payer: Self-pay | Admitting: Physician Assistant

## 2020-12-25 DIAGNOSIS — M791 Myalgia, unspecified site: Secondary | ICD-10-CM

## 2020-12-25 DIAGNOSIS — M542 Cervicalgia: Secondary | ICD-10-CM

## 2021-01-04 ENCOUNTER — Other Ambulatory Visit: Payer: Self-pay | Admitting: Physician Assistant

## 2021-01-09 NOTE — Progress Notes (Signed)
   I, Haley Martin, LAT, ATC acting as a scribe for Haley Graham, MD.  Haley Martin is a 55 y.o. female who presents to [facility] at [facility] today for f/u chronic neck pain and trapezius muscle spasm. 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 Pt was last seen by Dr. Denyse Martin on 12/10/20 and was advised to cont PT and HEP. Today, pt reports pain has worsened. Pt is compliant w/ HEP and PT and doing stretches. Pt reports having difficulty using TENS unit because she lives alone and is unable to the the pads on the correct spot. Pt locates pain bilat c-spine and down into R trap and rhomboid. Pt c/o numbness/tingling in R arm.  Dx imaging: 10/21/20 C-spine XR  Pertinent review of systems: No fevers or chills  Relevant historical information: Hypertension, anxiety   Exam:  BP (!) 138/92 (BP Location: Right Arm, Patient Position: Sitting, Cuff Size: Normal)   Pulse (!) 115   Ht 5\' 4"  (1.626 m)   Wt 173 lb 12.8 oz (78.8 kg)   LMP 12/11/2013 Comment: several years ago  SpO2 98%   BMI 29.83 kg/m  General: Well Developed, well nourished, and in no acute distress.   MSK: C-spine normal-appearing Nontender midline. Tender palpation right trapezius paraspinal musculature. Decreased cervical motion. Positive Spurling's test. Upper extremity strength reflexes and sensation are equal normal throughout.    Lab and Radiology Results DG Cervical Spine 2 or 3 views  Result Date: 10/22/2020 CLINICAL DATA:  Chronic neck pain. EXAM: CERVICAL SPINE - 2-3 VIEW COMPARISON:  None. FINDINGS: No fracture or spondylolisthesis is noted. Mild degenerative changes seen involving the C5-6 disc space with anterior posterior osteophyte formation. No prevertebral soft tissue swelling is noted. IMPRESSION: Mild degenerative disc disease is noted at C5-6. No acute abnormality seen in the cervical spine. Electronically Signed   By: 13/09/2020 M.D.   On: 10/22/2020 09:32   I,  13/09/2020, personally (independently) visualized and performed the interpretation of the images attached in this note.      Assessment and Plan: 55 y.o. female with right neck pain and now with radicular symptoms. Patient does have some DDD at C5-6 which may correspond to C6 radiculopathy. Plan for MRI to further characterize cause of pain and for potential epidural steroid injection planning. Likely will proceed directly to epidural steroid injection after MRI although patient could certainly follow-up to review the MRI if needed.   PDMP not reviewed this encounter. Orders Placed This Encounter  Procedures  . MR CERVICAL SPINE WO CONTRAST    Standing Status:   Future    Standing Expiration Date:   01/12/2022    Order Specific Question:   What is the patient's sedation requirement?    Answer:   No Sedation    Order Specific Question:   Does the patient have a pacemaker or implanted devices?    Answer:   No    Order Specific Question:   Preferred imaging location?    Answer:   01/14/2022 (table limit-350lbs)   No orders of the defined types were placed in this encounter.    Discussed warning signs or symptoms. Please see discharge instructions. Patient expresses understanding.   The above documentation has been reviewed and is accurate and complete Licensed conveyancer, M.D.

## 2021-01-11 ENCOUNTER — Other Ambulatory Visit: Payer: Self-pay | Admitting: Physician Assistant

## 2021-01-11 DIAGNOSIS — M542 Cervicalgia: Secondary | ICD-10-CM

## 2021-01-11 DIAGNOSIS — M791 Myalgia, unspecified site: Secondary | ICD-10-CM

## 2021-01-12 ENCOUNTER — Other Ambulatory Visit: Payer: Self-pay

## 2021-01-12 ENCOUNTER — Ambulatory Visit (INDEPENDENT_AMBULATORY_CARE_PROVIDER_SITE_OTHER): Payer: PRIVATE HEALTH INSURANCE | Admitting: Family Medicine

## 2021-01-12 VITALS — BP 138/92 | HR 115 | Ht 64.0 in | Wt 173.8 lb

## 2021-01-12 DIAGNOSIS — M5412 Radiculopathy, cervical region: Secondary | ICD-10-CM | POA: Diagnosis not present

## 2021-01-12 DIAGNOSIS — M542 Cervicalgia: Secondary | ICD-10-CM | POA: Diagnosis not present

## 2021-01-12 NOTE — Patient Instructions (Addendum)
Thank you for coming in today.  You should hear from MRI scheduling within 1 week. If you do not hear please let me know.   I will plan on an epidural steroid injection after the MRI.  You should hear soon after the MRI the results and the plan.   I will offer a follow appointment after the MRI to review the results.     Epidural Steroid Injection  An epidural steroid injection is a shot of steroid medicine and numbing medicine that is given into the space between the spinal cord and the bones of the back (epidural space). The shot helps relieve pain caused by an irritated or swollen nerve root. The amount of pain relief you get from the injection depends on what is causing the nerve to be swollen and irritated, and how long your pain lasts. You are more likely to benefit from this injection if your pain is strong and comes on suddenly rather than if you have had long-term (chronic) pain. Tell a health care provider about:  Any allergies you have.  All medicines you are taking, including vitamins, herbs, eye drops, creams, and over-the-counter medicines.  Any problems you or family members have had with anesthetic medicines.  Any blood disorders you have.  Any surgeries you have had.  Any medical conditions you have.  Whether you are pregnant or may be pregnant. What are the risks? Generally, this is a safe procedure. However, problems may occur, including:  Headache.  Bleeding.  Infection.  Allergic reaction to medicines.  Nerve damage. What happens before the procedure? Staying hydrated Follow instructions from your health care provider about hydration, which may include:  Up to 2 hours before the procedure - you may continue to drink clear liquids, such as water, clear fruit juice, black coffee, and plain tea. Eating and drinking restrictions Follow instructions from your health care provider about eating and drinking, which may include:  8 hours before the  procedure - stop eating heavy meals or foods, such as meat, fried foods, or fatty foods.  6 hours before the procedure - stop eating light meals or foods, such as toast or cereal.  6 hours before the procedure - stop drinking milk or drinks that contain milk.  2 hours before the procedure - stop drinking clear liquids. Medicines  You may be given medicines to lower anxiety.  Ask your health care provider about: ? Changing or stopping your regular medicines. This is especially important if you are taking diabetes medicines or blood thinners. ? Taking medicines such as aspirin and ibuprofen. These medicines can thin your blood. Do not take these medicines unless your health care provider tells you to take them. ? Taking over-the-counter medicines, vitamins, herbs, and supplements. General instructions  Ask your health care provider what steps will be taken to prevent infection.  Plan to have a responsible adult take you home from the hospital or clinic.  If you will be going home right after the procedure, plan to have a responsible adult care for you for the time you are told. This is important. What happens during the procedure?  An IV will be inserted into one of your veins.  You will be given one or more of the following: ? A medicine to help you relax (sedative). ? A medicine to numb the area (local anesthetic).  You will be asked to lie on your abdomen or sit.  The injection site will be cleaned.  A needle will be inserted  through your skin into the epidural space. This may cause you some discomfort. An X-ray machine will be used to guide the needle as close as possible to the affected nerve.  A steroid medicine and a local anesthetic will be injected into the epidural space.  The needle and IV will be removed.  A bandage (dressing) will be put over the injection site. The procedure may vary among health care providers and hospitals. What can I expect after the  procedure?  Your blood pressure, heart rate, breathing rate, and blood oxygen level will be monitored until you leave the hospital or clinic.  Your arm or leg may feel weak or numb for a few hours.  The injection site may feel sore. Follow these instructions at home: Injection site care  You may remove the bandage (dressing) after 24 hours.  Check your injection site every day for signs of infection. Check for: ? Redness, swelling, or pain. ? Fluid or blood. ? Warmth. ? Pus or a bad smell. Managing pain, stiffness, and swelling  For 24 hours after the procedure: ? Avoid using heat on the injection site. ? Do not take baths, swim, or use a hot tub until your health care provider approves. Ask your health care provider if you may take showers. You may only be allowed to take sponge baths.   If directed, put ice on the injection site. To do this: ? Put ice in a plastic bag. ? Place a towel between your skin and the bag. ? Leave the ice on for 20 minutes, 2-3 times a day.   Activity  If you were given a sedative during the procedure, it can affect you for several hours. Do not drive or operate machinery until your health care provider says that it is safe.  Return to your normal activities as told by your health care provider. Ask your health care provider what activities are safe for you. General instructions  Take over-the-counter and prescription medicines only as told by your health care provider.  Drink enough fluid to keep your urine pale yellow.  Keep all follow-up visits as told by your health care provider. This is important. Contact a health care provider if:  You have any of these signs of infection: ? Redness, swelling, or pain around your injection site. ? Fluid or blood coming from your injection site. ? Warmth coming from your injection site. ? Pus or a bad smell coming from your injection site. ? A fever.  You continue to have pain and soreness around the  injection site, even after taking over-the-counter pain medicine.  You have severe, sudden, or lasting nausea or vomiting. Get help right away if:  You have severe pain at the injection site that is not relieved by medicines.  You develop a severe headache or a stiff neck.  You become sensitive to light.  You have any new numbness or weakness in your legs or arms.  You lose control of your bladder or bowel movements.  You have trouble breathing. Summary  An epidural steroid injection is a shot of steroid medicine and numbing medicine that is given into the epidural space.  The shot helps relieve pain caused by an irritated or swollen nerve root.  You are more likely to benefit from this injection if your pain is strong and comes on suddenly rather than if you have had chronic pain. This information is not intended to replace advice given to you by your health care provider. Make  sure you discuss any questions you have with your health care provider. Document Revised: 03/28/2020 Document Reviewed: 06/11/2019 Elsevier Patient Education  2021 [employer].

## 2021-01-13 NOTE — Telephone Encounter (Signed)
Patient requesting a refill of the Tizanidine last rx 12/25/20 #60 LOV-10/20/20 for Cervicalgia Has seen Sports Medicine 01/12/21, proceeding with MRI and epidural steroidal injection

## 2021-01-22 IMAGING — CT CT ABDOMEN AND PELVIS WITH CONTRAST
2 of 5 series · 15 of 46 positions shown, 17 images · IV contrast (APPLIED)
Comparison: None.

CLINICAL DATA: rlq pain x 20 hrs, diarrhea, wbc=17.5, some
confusion and slurred speech after taking 30mg valium this
afternooon

EXAM:
CT ABDOMEN AND PELVIS WITH CONTRAST
TECHNIQUE: Multidetector CT imaging of the abdomen and pelvis was performed
using the standard protocol following bolus administration of
intravenous contrast.
CONTRAST:  100mL OMNIPAQUE IOHEXOL 300 MG/ML  SOLN

[Series 2: axial st · axial · 0.89mm/px · z∈[-430,+30]mm · 12 of 104 slices shown, 14 images]
[im 6/104  soft-tissue]
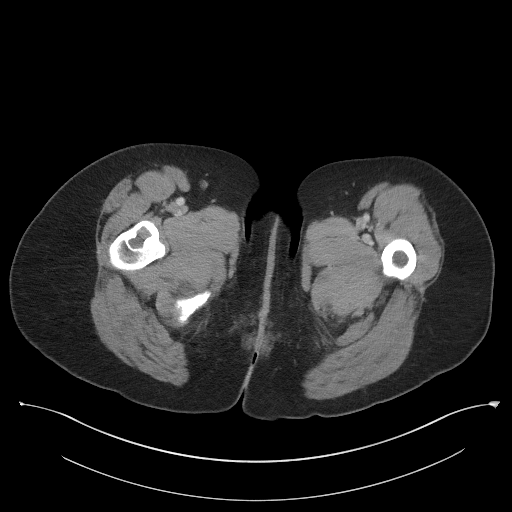
[im 6/104  bone]
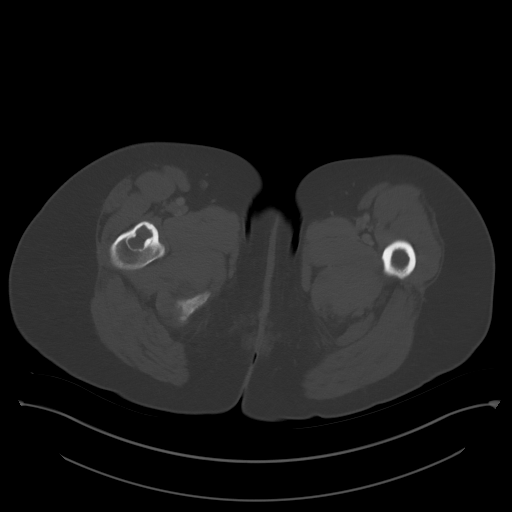
[im 17/104  soft-tissue]
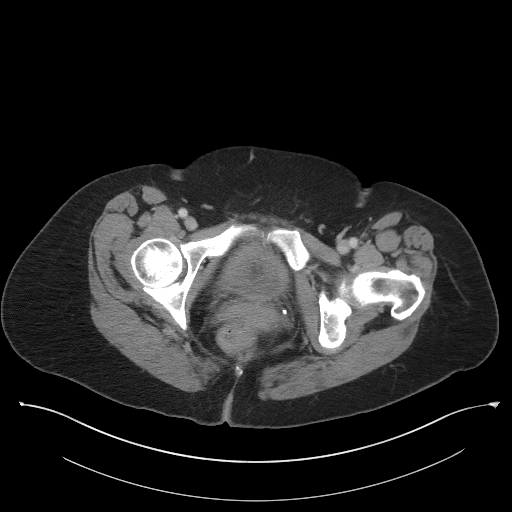
[im 22/104  soft-tissue]
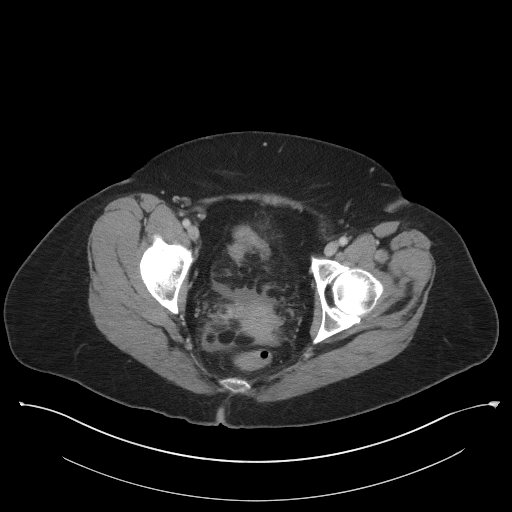
[im 33/104  soft-tissue]
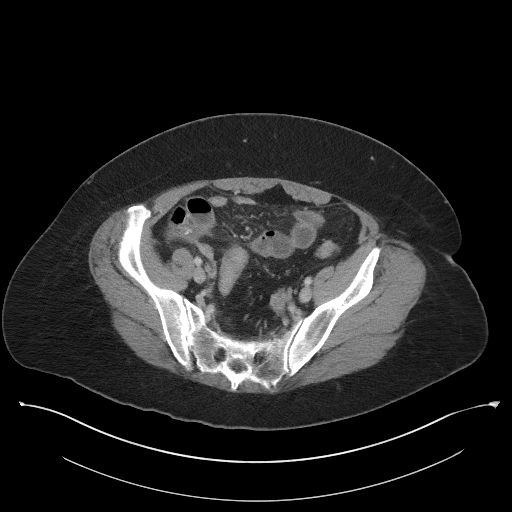
[im 38/104  soft-tissue]
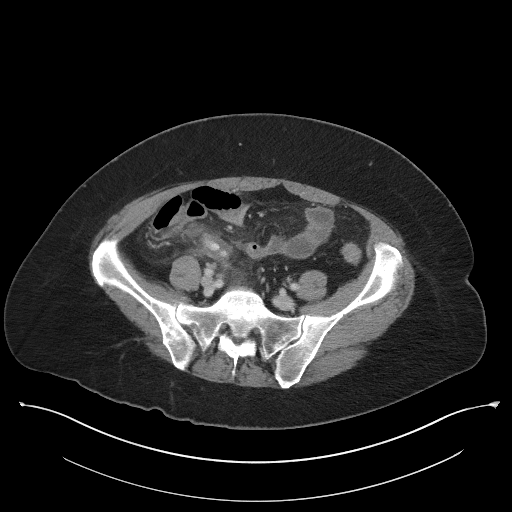
[im 49/104  soft-tissue]
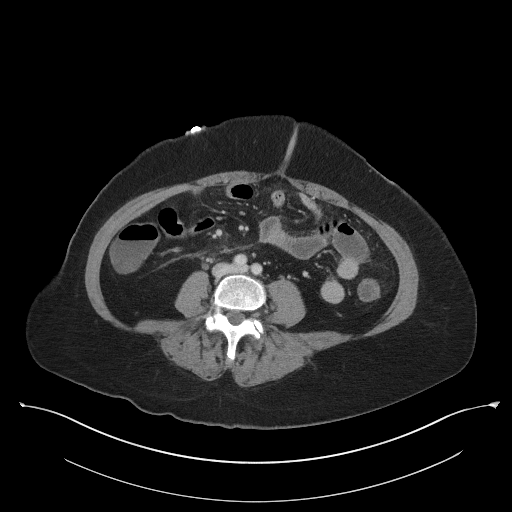
[im 55/104  soft-tissue]
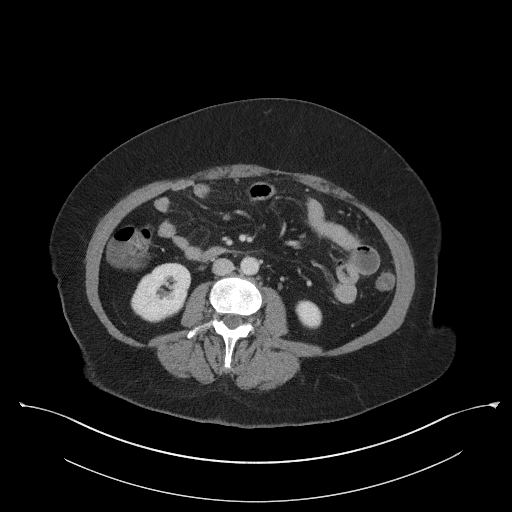
[im 66/104  soft-tissue]
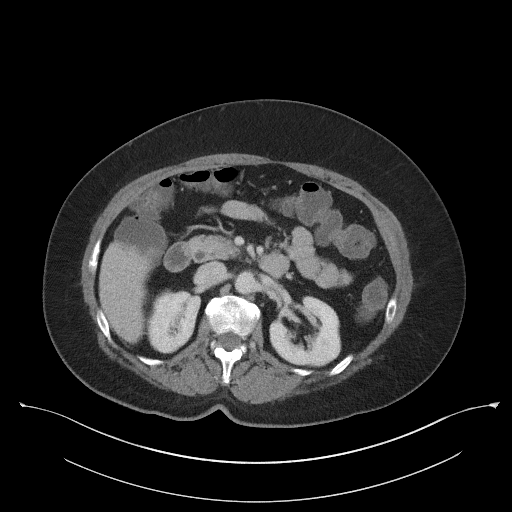
[im 71/104  soft-tissue]
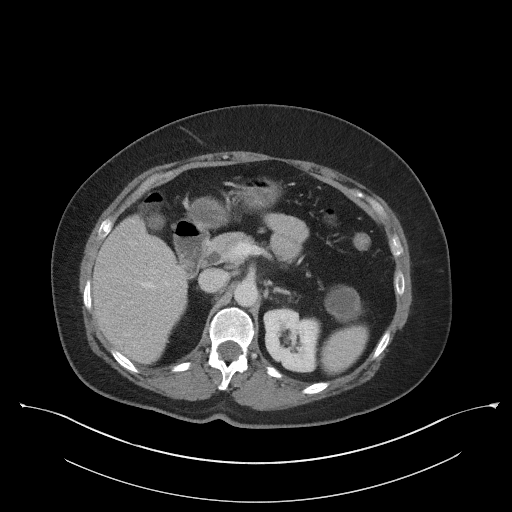
[im 71/104  bone]
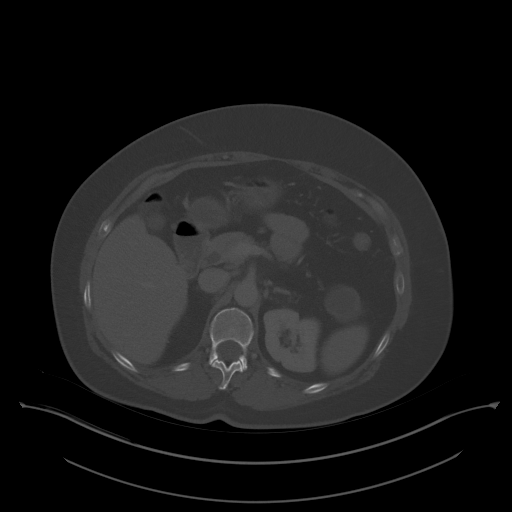
[im 82/104  soft-tissue]
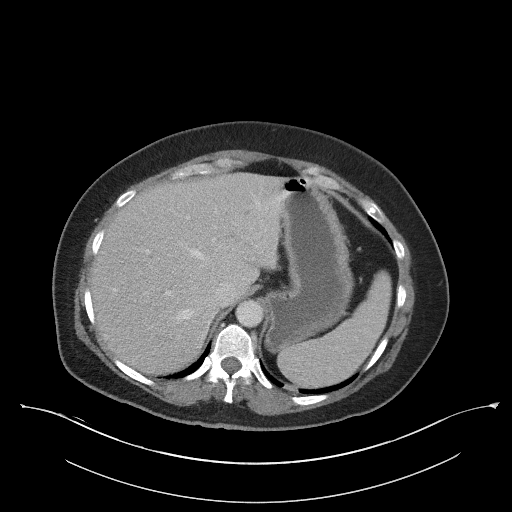
[im 87/104  soft-tissue]
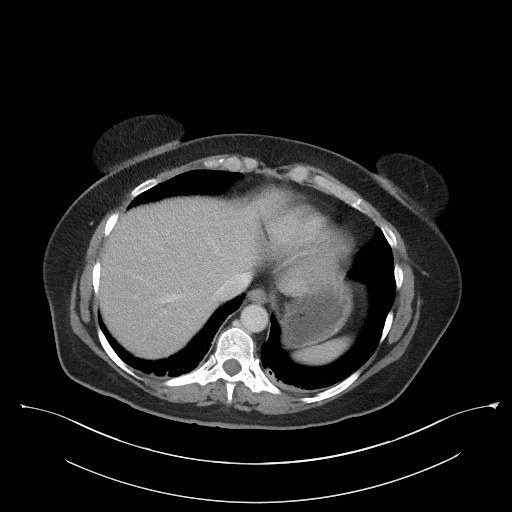
[im 98/104  soft-tissue]
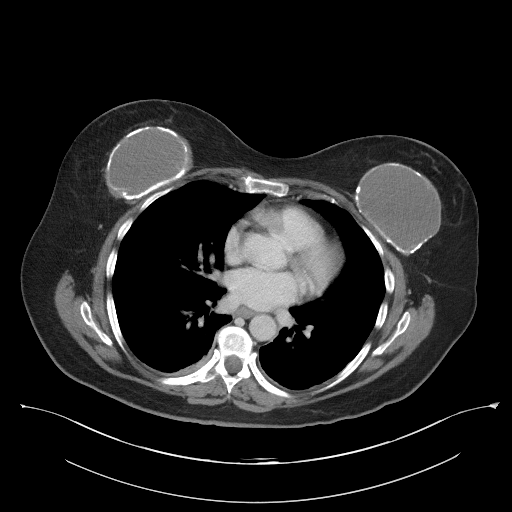

[Series 5: coronal st · coronal · 0.79mm/px · 3 of 86 slices shown]
[im 29/86  soft-tissue]
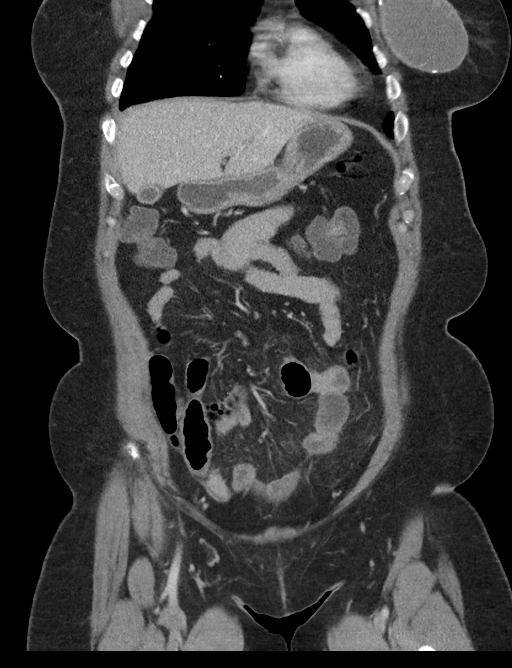
[im 38/86  soft-tissue]
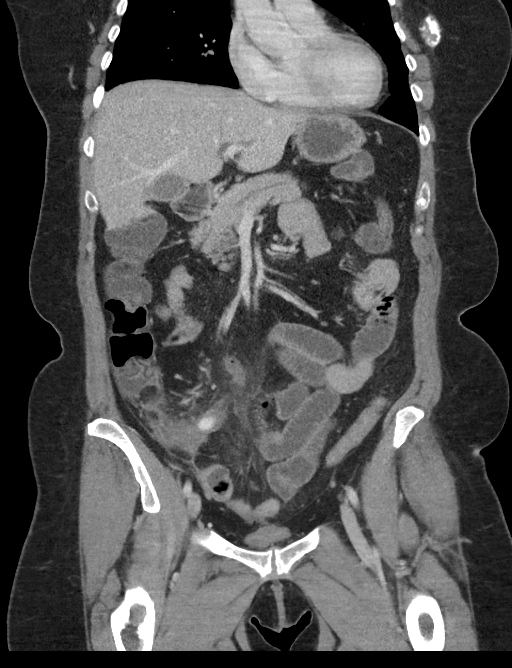
[im 48/86  soft-tissue]
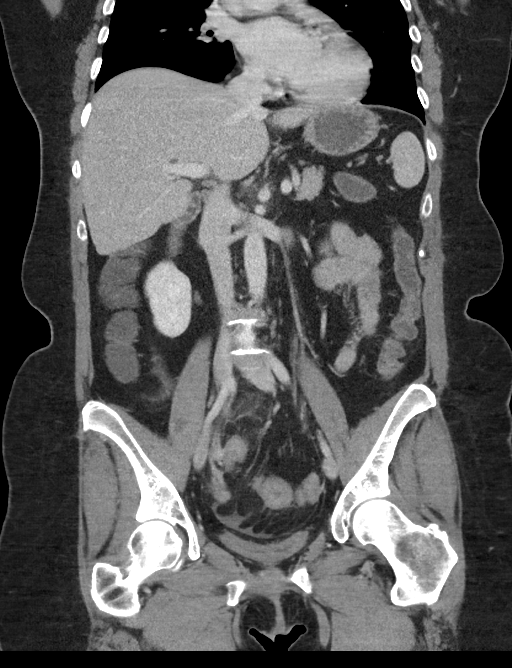

[15 of 46 positions shown; findings below may reference images not displayed]

FINDINGS: Lower chest: Mild dependent lower lobe atelectasis. Lung bases
otherwise clear. Heart normal in size.

Hepatobiliary: Liver normal in size and attenuation. 6 mm
low-density lesion, segment 7, consistent with a cyst. No other
liver masses or lesions. Gallbladder mildly distended. No stones. No
inflammation. Common bile duct dilated to 1 cm. No evidence of a
duct stone.

Pancreas: Unremarkable. No pancreatic ductal dilatation or
surrounding inflammatory changes.

Spleen: Normal in size without focal abnormality.

Adrenals/Urinary Tract: No adrenal masses.

Kidneys normal size, orientation and position. No renal masses,
stones or hydronephrosis. Normal ureters. Normal bladder.

Stomach/Bowel: Appendix is significantly dilated, to 1.7 cm, with
wall thickening, appendicoliths adjacent inflammation. There is a
small bubble of extraluminal air adjacent to the appendiceal tip. No
discrete fluid collection is seen to suggest an abscess.

Scattered air-fluid levels are noted in the small bowel and right
colon consistent with a mild adynamic ileus. No small bowel or
colonic wall thickening. Stomach is unremarkable.

Vascular/Lymphatic: No significant vascular findings are present. No
enlarged abdominal or pelvic lymph nodes.

Reproductive: Uterus and bilateral adnexa are unremarkable.

Other: Trace amount of ascites collects adjacent to the inflamed
appendix and in the posterior pelvic recess.

Musculoskeletal: No fracture or acute finding. No osteoblastic or
osteolytic lesions.
IMPRESSION: 1. Acute appendicitis. Small bubble of extraluminal air adjacent to
the appendiceal tip is consistent with rupture. No evidence of an
abscess. There are prominent appendicoliths at and adjacent to the
appendiceal base. Critical Value/emergent results were called by
telephone at the time of interpretation on 03/01/2019 at [DATE] to
Dr. MORTEN TEJA , who verbally acknowledged these results.
2. Scattered colonic and small bowel are fluid levels consistent
with a reactive adynamic ileus. Trace amount of ascites.
3. No other acute abnormality.

## 2021-01-24 ENCOUNTER — Other Ambulatory Visit: Payer: Self-pay | Admitting: Family

## 2021-01-24 DIAGNOSIS — M791 Myalgia, unspecified site: Secondary | ICD-10-CM

## 2021-01-24 DIAGNOSIS — M542 Cervicalgia: Secondary | ICD-10-CM

## 2021-01-26 ENCOUNTER — Encounter: Payer: Self-pay | Admitting: Family Medicine

## 2021-02-04 ENCOUNTER — Other Ambulatory Visit: Payer: Self-pay | Admitting: Family

## 2021-02-04 DIAGNOSIS — M791 Myalgia, unspecified site: Secondary | ICD-10-CM

## 2021-02-04 DIAGNOSIS — M542 Cervicalgia: Secondary | ICD-10-CM

## 2021-02-08 ENCOUNTER — Other Ambulatory Visit: Payer: Self-pay

## 2021-02-08 ENCOUNTER — Ambulatory Visit (INDEPENDENT_AMBULATORY_CARE_PROVIDER_SITE_OTHER): Payer: No Typology Code available for payment source

## 2021-02-08 DIAGNOSIS — M50223 Other cervical disc displacement at C6-C7 level: Secondary | ICD-10-CM | POA: Diagnosis not present

## 2021-02-08 DIAGNOSIS — M50222 Other cervical disc displacement at C5-C6 level: Secondary | ICD-10-CM

## 2021-02-08 DIAGNOSIS — M542 Cervicalgia: Secondary | ICD-10-CM

## 2021-02-08 DIAGNOSIS — M5022 Other cervical disc displacement, mid-cervical region, unspecified level: Secondary | ICD-10-CM

## 2021-02-08 DIAGNOSIS — M5412 Radiculopathy, cervical region: Secondary | ICD-10-CM

## 2021-02-09 ENCOUNTER — Ambulatory Visit: Payer: PRIVATE HEALTH INSURANCE | Admitting: Podiatry

## 2021-02-09 ENCOUNTER — Encounter: Payer: Self-pay | Admitting: Family Medicine

## 2021-02-10 ENCOUNTER — Telehealth: Payer: Self-pay | Admitting: Family Medicine

## 2021-02-10 ENCOUNTER — Other Ambulatory Visit: Payer: Self-pay | Admitting: Physician Assistant

## 2021-02-10 ENCOUNTER — Encounter: Payer: Self-pay | Admitting: Family Medicine

## 2021-02-10 ENCOUNTER — Other Ambulatory Visit: Payer: Self-pay

## 2021-02-10 ENCOUNTER — Ambulatory Visit (INDEPENDENT_AMBULATORY_CARE_PROVIDER_SITE_OTHER): Payer: PRIVATE HEALTH INSURANCE | Admitting: Family Medicine

## 2021-02-10 VITALS — BP 120/70 | HR 101 | Temp 97.9°F | Resp 17 | Ht 64.0 in | Wt 178.0 lb

## 2021-02-10 DIAGNOSIS — M5412 Radiculopathy, cervical region: Secondary | ICD-10-CM | POA: Diagnosis not present

## 2021-02-10 DIAGNOSIS — R350 Frequency of micturition: Secondary | ICD-10-CM | POA: Diagnosis not present

## 2021-02-10 LAB — POCT URINALYSIS DIPSTICK
Bilirubin, UA: NEGATIVE
Blood, UA: NEGATIVE
Glucose, UA: NEGATIVE
Ketones, UA: 5
Nitrite, UA: NEGATIVE
Protein, UA: POSITIVE — AB
Spec Grav, UA: 1.015 (ref 1.010–1.025)
Urobilinogen, UA: 0.2 E.U./dL
pH, UA: 7 (ref 5.0–8.0)

## 2021-02-10 MED ORDER — NITROFURANTOIN MONOHYD MACRO 100 MG PO CAPS: 100.0000 mg | ORAL_CAPSULE | Freq: Two times a day (BID) | ORAL | 0 refills | Status: DC

## 2021-02-10 NOTE — Progress Notes (Signed)
   Subjective:    Patient ID: Haley Martin, female    DOB: 1966/09/21, 55 y.o.   MRN: [id number]  HPI UTI- pt reports she was supposed to have bladder surgery and in the meantime, she was started on oxybutynin.  She states this caused her to double over in pain.  Reports that she still has some suprapubic pain.  + urine odor.  Occasional dysuria.  + urinary frequency.  No recent N/V.  No fevers/chills.   Review of Systems For ROS see HPI   This visit occurred during the SARS-CoV-2 public health emergency.  Safety protocols were in place, including screening questions prior to the visit, additional usage of staff PPE, and extensive cleaning of exam room while observing appropriate contact time as indicated for disinfecting solutions.       Objective:   Physical Exam Constitutional:      General: She is not in acute distress.    Appearance: Normal appearance. She is not ill-appearing.  HENT:     Head: Normocephalic and atraumatic.  Abdominal:     General: Abdomen is flat.     Palpations: Abdomen is soft.     Tenderness: There is no abdominal tenderness. There is no guarding.  Neurological:     General: No focal deficit present.     Mental Status: She is alert and oriented to person, place, and time.           Assessment & Plan:  Urinary frequency- pt's sxs and UA are consistent w/ infxn.  Will send for cx.  Start Macrobid twice daily due to PCN and Sulfa allergies.  Reviewed supportive care and red flags that should prompt return.  Pt expressed understanding and is in agreement w/ plan.

## 2021-02-10 NOTE — Telephone Encounter (Signed)
Pt called, we ordered epidural but [facility] does NOT participate with [employer] insurance. Not sure what another option is.

## 2021-02-10 NOTE — Progress Notes (Signed)
MRI cervical spine shows fortunately mild arthritis changes and some mild bulging disks.  It does look like the right C7 nerve root could get pinched.  When I saw you about a month ago you were having more arm pain than neck pain and I wanted to proceed to an epidural steroid injection.  Is that still the case at this point?  Since it has been a month I recommend that you schedule a follow-up appointment with me to go over the results of full detail and to discuss what her neck steps are going to be.  However if you are having significant arm pain I can go ahead and directly order epidural steroid injection.

## 2021-02-10 NOTE — Progress Notes (Signed)
Virtual Visit  I connected with   Haley Martin  by a phone telemedicine application and verified that I am speaking with the correct person using two identifiers.   I discussed the limitations of evaluation and management by telemedicine and the availability of in person appointments. The patient expressed understanding and agreed to proceed.  ? Location of the provider office ? Location of the patient home ? The names and roles of all persons participating in the visit. Patient and myself   History of Present Illness: Haley Martin is a 55 y.o. female who would like to discuss MRI findings and cervical radicular pain  Patient continues to experience bilateral cervical radicular pain worse in the left side. She is eager to proceed neck steps including epidural steroid injection. She has no questions about her MRI. She denies any new weakness.   Observations/Objective: LMP 12/11/2013 Comment: several years ago Wt Readings from Last 5 Encounters:  02/10/21 178 lb (80.7 kg)  01/12/21 173 lb 12.8 oz (78.8 kg)  12/10/20 180 lb (81.6 kg)  10/21/20 187 lb 6.4 oz (85 kg)  10/20/20 188 lb (85.3 kg)   Exam: Normal Speech.    Lab and Radiology Results MR CERVICAL SPINE WO CONTRAST  Result Date: 02/09/2021 CLINICAL DATA:  Cervical radiculopathy, no red flags EXAM: MRI CERVICAL SPINE WITHOUT CONTRAST TECHNIQUE: Multiplanar, multisequence MR imaging of the cervical spine was performed. No intravenous contrast was administered. COMPARISON:  10/21/2020 FINDINGS: Alignment: Straightening of lordosis. Vertebrae: Normal bone marrow signal intensity. No focal osseous lesion. Cord: Normal signal and morphology. Posterior Fossa, vertebral arteries: Negative. Disc levels: Mild desiccation. C2-3: No significant disc bulge. Patent spinal canal and neural foramen C3-4: Shallow central protrusion. Patent spinal canal and neural foramen. C4-5: No significant disc bulge. Patent spinal canal and neural  foramen C5-6: Disc osteophyte complex with shallow right subarticular protrusion. Patent spinal canal and neural foramen. C6-7: Small disc osteophyte complex with shallow right foraminal protrusion. Uncovertebral degenerative spurring. Patent spinal canal and left neural foramen. Minimal to mild right neural foraminal narrowing. C7-T1: No significant disc bulge. Patent spinal canal and neural foramen Paraspinal tissues: Negative. IMPRESSION: Mild degenerative changes. Minimal to mild right C6-7 neural foraminal narrowing. Shallow central C3-4, right C5-6 subarticular and right C6-7 foraminal protrusions. No significant spinal canal narrowing or cord impingement. Electronically Signed   By: Stana Bunting M.D.   On: 02/09/2021 08:34   I, [name], personally (independently) visualized and performed the interpretation of the images attached in this note.    Assessment and Plan: 55 y.o. female with cervical radiculopathy. Plan for epidural steroid injection. Patient was having obtained following injection.  PDMP not reviewed this encounter. Orders Placed This Encounter  Procedures  . DG INJECT DIAG/THERA/INC NEEDLE/CATH/PLC EPI/LUMB/SAC W/IMG    Standing Status:   Future    Standing Expiration Date:   02/10/2022    Order Specific Question:   Reason for Exam (SYMPTOM  OR DIAGNOSIS REQUIRED)    Answer:   Cervical radicular pain R>L. Level and technique per radiuology    Order Specific Question:   Is the patient pregnant?    Answer:   No    Order Specific Question:   Preferred Imaging Location?    Answer:   [facility]    Order Specific Question:   Radiology Contrast Protocol - do NOT remove file path    Answer:   \\[email or web address]   No orders of the defined types were placed in this  encounter.   Follow Up Instructions:    I discussed the assessment and treatment plan with the patient. The patient was provided an opportunity to ask  questions and all were answered. The patient agreed with the plan and demonstrated an understanding of the instructions.   The patient was advised to call back or seek an in-person evaluation if the symptoms worsen or if the condition fails to improve as anticipated.  Time: 11 mins discussion    Historical information moved to improve visibility of documentation.  Past Medical History:  Diagnosis Date  . Adult ADHD    managed by Dr. Evelene Croon  . Anxiety and depression    Managed by Dr. Evelene Croon.  Marland Kitchen BRVO (branch retinal vein occlusion) 2014   R eye 2014--followed by Dr. Luciana Axe.  MRI brain 11/19/15 showed mildly advanced generalized atrophy for age, +chronic small vessel ischemic changes, no acute finding.  . Depression   . Family history of colon cancer    11/2017 colonoscopy NORM.  Recall 5 yrs (Dr. Rhea Belton)  . Postmenopausal vaginal bleeding 05/2017   Pelvic u/s 06/2017 with thickened endometrium---referred to GYN for endometrial biopsy.  . Shingles 06/07/2019   L waistline   . Stroke [facility]) 2014   rt.eye   Past Surgical History:  Procedure Laterality Date  . APPENDECTOMY  02/28/2019  . BREAST ENHANCEMENT SURGERY    . Carotid dopplers  09/19/2015   NORMAL  . chin implant    . COLONOSCOPY  11/30/2017   NORMAL: recall 5 yrs ([facility])  . FINGER SURGERY     cyst  . LAPAROSCOPIC APPENDECTOMY N/A 03/01/2019   Procedure: APPENDECTOMY LAPAROSCOPIC WITH DRAIN PLACEMENT;  Surgeon: Darnell Level, MD;  Location: [facility];  Service: General;  Laterality: N/A;  . NECK SURGERY  REMOTE past   lymph node excision--benign per pt report  . SALIVARY GLAND SURGERY     stone   . TMJ ARTHROPLASTY     Social History   Tobacco Use  . Smoking status: Current Some Day Smoker    Packs/day: 0.20    Years: 2.00    Pack years: 0.40    Types: Cigarettes  . Smokeless tobacco: Never Used  Substance Use Topics  . Alcohol use: Yes    Alcohol/week: 0.0 standard drinks    Comment: 1-3 beers a day   family  history includes Bone cancer in her paternal aunt; Breast cancer in her paternal aunt; Cancer in her father and maternal grandmother; Colon cancer in her maternal uncle and maternal uncle; Colon polyps in her mother; Healthy in her daughter, sister, and son; Heart attack in her mother; Heart disease in her mother; Hyperlipidemia in her father and mother; Hypertension in her father; Lung cancer in her father and paternal aunt; Stroke in her father; Thyroid disease in her mother.  Medications: Current Outpatient Medications  Medication Sig Dispense Refill  . ALPRAZolam (XANAX) 1 MG tablet Take 1 mg by mouth 3 (three) times daily. (Patient not taking: Reported on 02/10/2021)    . amphetamine-dextroamphetamine (ADDERALL) 20 MG tablet Take 20 mg by mouth 3 (three) times daily.    . ARIPiprazole (ABILIFY) 2 MG tablet Take 2 mg by mouth daily.    Marland Kitchen conjugated estrogens (PREMARIN) vaginal cream Discard plastic applicator. Insert blueberry size amount of cream on finger in vagina daily x1 week then 2x per week. (Patient not taking: Reported on 02/10/2021)    . desvenlafaxine (PRISTIQ) 100 MG 24 hr tablet Take 100 mg by mouth every morning.    Marland Kitchen  gabapentin (NEURONTIN) 400 MG capsule Take 400 mg by mouth in the morning, at noon, in the evening, and at bedtime. (Patient not taking: Reported on 02/10/2021)    . gabapentin (NEURONTIN) 600 MG tablet Take by mouth.    . hydrochlorothiazide (MICROZIDE) 12.5 MG capsule TAKE 1 CAPSULE BY MOUTH EVERY DAY 90 capsule 0  . mirtazapine (REMERON) 30 MG tablet Take 30 mg by mouth at bedtime. (Patient not taking: Reported on 02/10/2021)    . Multiple Vitamins-Minerals (CENTRUM SILVER PO) Take 1 tablet daily by mouth. (Patient not taking: Reported on 02/10/2021)    . nitrofurantoin, macrocrystal-monohydrate, (MACROBID) 100 MG capsule Take 1 capsule (100 mg total) by mouth 2 (two) times daily. 14 capsule 0  . pantoprazole (PROTONIX) 40 MG tablet TAKE 1 TABLET BY MOUTH EVERY DAY 90 tablet  1  . tiZANidine (ZANAFLEX) 4 MG tablet TAKE 1 TABLET BY MOUTH EVERY 6 HOURS AS NEEDED FOR MUSCLE SPASMS. 60 tablet 0  . traZODone (DESYREL) 50 MG tablet     . valACYclovir (VALTREX) 1000 MG tablet Take 2 tablets by mouth every 12 hours 4 tablet 0   No current facility-administered medications for this visit.   Allergies  Allergen Reactions  . Penicillins Anaphylaxis  . Sulfa Antibiotics Anaphylaxis

## 2021-02-10 NOTE — Patient Instructions (Signed)
Thank you for coming in today.  [phone] to schedule the epidural steroid injection.

## 2021-02-10 NOTE — Patient Instructions (Signed)
Follow up as needed or as scheduled START the Nitrofurantoin (Macrobid) twice daily Drink plenty of fluids Call with any questions or concerns Feel better!!!

## 2021-02-11 ENCOUNTER — Ambulatory Visit (INDEPENDENT_AMBULATORY_CARE_PROVIDER_SITE_OTHER): Payer: PRIVATE HEALTH INSURANCE

## 2021-02-11 ENCOUNTER — Telehealth: Payer: Self-pay | Admitting: Physician Assistant

## 2021-02-11 ENCOUNTER — Ambulatory Visit (INDEPENDENT_AMBULATORY_CARE_PROVIDER_SITE_OTHER): Payer: PRIVATE HEALTH INSURANCE | Admitting: Podiatry

## 2021-02-11 DIAGNOSIS — M7752 Other enthesopathy of left foot: Secondary | ICD-10-CM

## 2021-02-11 DIAGNOSIS — M25872 Other specified joint disorders, left ankle and foot: Secondary | ICD-10-CM

## 2021-02-11 DIAGNOSIS — S92416A Nondisplaced fracture of proximal phalanx of unspecified great toe, initial encounter for closed fracture: Secondary | ICD-10-CM

## 2021-02-11 NOTE — Telephone Encounter (Signed)
Patient would have to call insurance and ask. They will not give me that information for some reason unless I am patient or someone legally able to do things on her behalf. Silly but ive had trouble. Calling insurance is the fastest way to get the approved locations.

## 2021-02-11 NOTE — Telephone Encounter (Signed)
Pt called in asking for her UA to be faxed to [facility] in [place].  Pt saw Dr. Beverely Low yesterday 02/10/21

## 2021-02-11 NOTE — Telephone Encounter (Signed)
Would you be able to call and check to see which location in terms of an epidural will accept her insurance?  If not, no big deal.  I can always tell her to call her insurance and figure it out on her end.

## 2021-02-11 NOTE — Telephone Encounter (Signed)
Other options are the pain management groups here in town for the orthopedic or neurosurgery groups.  Recommend that she ask her insurance company which group does epidural steroid injections in this area.  We could also try to find this out as well.

## 2021-02-11 NOTE — Telephone Encounter (Signed)
Spoke to patient. She will be calling her insurance today and will let us know what she finds out.

## 2021-02-11 NOTE — Telephone Encounter (Signed)
Called and addressed patient concerns.

## 2021-02-13 LAB — URINE CULTURE
MICRO NUMBER:: 11594298
SPECIMEN QUALITY:: ADEQUATE

## 2021-02-14 ENCOUNTER — Other Ambulatory Visit: Payer: Self-pay | Admitting: Family

## 2021-02-14 DIAGNOSIS — M791 Myalgia, unspecified site: Secondary | ICD-10-CM

## 2021-02-14 DIAGNOSIS — M542 Cervicalgia: Secondary | ICD-10-CM

## 2021-02-15 DIAGNOSIS — M25872 Other specified joint disorders, left ankle and foot: Secondary | ICD-10-CM | POA: Diagnosis not present

## 2021-02-15 MED ORDER — BETAMETHASONE SOD PHOS & ACET 6 (3-3) MG/ML IJ SUSP
3.0000 mg | Freq: Once | INTRAMUSCULAR | Status: AC
  Administered 2021-02-15: 3 mg via INTRA_ARTICULAR

## 2021-02-15 NOTE — Progress Notes (Signed)
   HPI: 55 y.o. female presenting today established to the practice presenting with a new complaint regarding pain and tenderness to the left foot.  Patient states she has pain and a knot on top of her foot over her great toe.  She states that several years ago she sustained a fracture to this area.  This is been painful and tender for the last few weeks now.  She presents for further treatment and evaluation  Past Medical History:  Diagnosis Date  . Adult ADHD    managed by Dr. Evelene Croon  . Anxiety and depression    Managed by Dr. Evelene Croon.  Marland Kitchen BRVO (branch retinal vein occlusion) 2014   R eye 2014--followed by Dr. Luciana Axe.  MRI brain 11/19/15 showed mildly advanced generalized atrophy for age, +chronic small vessel ischemic changes, no acute finding.  . Depression   . Family history of colon cancer    11/2017 colonoscopy NORM.  Recall 5 yrs (Dr. Rhea Belton)  . Postmenopausal vaginal bleeding 05/2017   Pelvic u/s 06/2017 with thickened endometrium---referred to GYN for endometrial biopsy.  . Shingles 06/07/2019   L waistline   . Stroke [facility]) 2014   rt.eye     Physical Exam: General: The patient is alert and oriented x3 in no acute distress.  Dermatology: Skin is warm, dry and supple bilateral lower extremities. Negative for open lesions or macerations.  Vascular: Palpable pedal pulses bilaterally. No edema or erythema noted. Capillary refill within normal limits.  Neurological: Epicritic and protective threshold grossly intact bilaterally.   Musculoskeletal Exam: Range of motion within normal limits to all pedal and ankle joints bilateral. Muscle strength 5/5 in all groups bilateral.  Pain with direct palpation of the fibular sesamoid left foot.  There is also some pain and tenderness with palpation range of motion of the joint in general  Radiographic Exam:  Normal osseous mineralization. Joint spaces preserved. No fracture/dislocation/boney destruction.    Assessment: 1.  Fibular sesamoiditis  left 2.  First MTPJ capsulitis left   Plan of Care:  1. Patient evaluated. X-Rays reviewed.  2.  Injection of 0.5 cc Celestone Soluspan injected in the first MTPJ and sesamoidal apparatus left foot 3.  Postsurgical shoe dispensed.  Weightbearing as tolerated 4.  Return to clinic in 4 weeks      Felecia Shelling, DPM [facility]  Dr. Felecia Shelling, DPM    [address] [place], [place] [address]                Office [phone]  Fax [phone]

## 2021-02-17 ENCOUNTER — Telehealth: Payer: Self-pay | Admitting: Family Medicine

## 2021-02-17 NOTE — Telephone Encounter (Signed)
Patient called stating that she is scheduled for her court date on March 16th. Her lawyer told her that it would probably be a very packed room and might take a while. She is concerned about sitting on the bench for a long period of time. She asked if Dr [name] would like to write a letter excusing her for the court date?  Please advise.

## 2021-02-19 ENCOUNTER — Encounter: Payer: Self-pay | Admitting: Family Medicine

## 2021-02-19 NOTE — Telephone Encounter (Signed)
[employer] Fax # [phone]
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
## 2021-02-19 NOTE — Telephone Encounter (Signed)
This is for jury duty or some other kind of court thing?  There is a different f letter template that we will use for jury duty.  If jury duty do you know your jury number?  This often helps.
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
## 2021-02-20 ENCOUNTER — Encounter: Payer: Self-pay | Admitting: Family Medicine

## 2021-02-20 NOTE — Telephone Encounter (Signed)
Letter written and will be faxed today.

## 2021-02-20 NOTE — Telephone Encounter (Signed)
Pt was sent a MyChart message to inform her.

## 2021-03-10 ENCOUNTER — Other Ambulatory Visit: Payer: Self-pay

## 2021-03-10 DIAGNOSIS — M791 Myalgia, unspecified site: Secondary | ICD-10-CM

## 2021-03-10 DIAGNOSIS — M542 Cervicalgia: Secondary | ICD-10-CM

## 2021-03-10 MED ORDER — TIZANIDINE HCL 4 MG PO TABS: 4.0000 mg | ORAL_TABLET | Freq: Four times a day (QID) | ORAL | 0 refills | Status: DC | PRN

## 2021-03-11 ENCOUNTER — Ambulatory Visit: Payer: PRIVATE HEALTH INSURANCE | Admitting: Podiatry

## 2021-03-18 ENCOUNTER — Other Ambulatory Visit: Payer: Self-pay | Admitting: Family Medicine

## 2021-03-18 DIAGNOSIS — M542 Cervicalgia: Secondary | ICD-10-CM

## 2021-03-18 DIAGNOSIS — M791 Myalgia, unspecified site: Secondary | ICD-10-CM

## 2021-04-06 ENCOUNTER — Telehealth: Payer: Self-pay

## 2021-04-06 NOTE — Telephone Encounter (Signed)
Patient called and spoke with a Triage nurse this morning and stated that her blood pressure was low, and she was dizzy.I called the patient and she said she is feeling great and blood pressure has went up to 110/72. She states she went drunk some water, ate some bread, and went outside to get fresh air and she's okay.

## 2021-04-07 ENCOUNTER — Encounter: Payer: PRIVATE HEALTH INSURANCE | Admitting: Registered Nurse

## 2021-04-26 ENCOUNTER — Other Ambulatory Visit: Payer: Self-pay | Admitting: Family Medicine

## 2021-04-26 DIAGNOSIS — M791 Myalgia, unspecified site: Secondary | ICD-10-CM

## 2021-04-26 DIAGNOSIS — M542 Cervicalgia: Secondary | ICD-10-CM

## 2021-05-13 ENCOUNTER — Other Ambulatory Visit: Payer: Self-pay | Admitting: Family

## 2021-05-13 DIAGNOSIS — M791 Myalgia, unspecified site: Secondary | ICD-10-CM

## 2021-05-13 DIAGNOSIS — M542 Cervicalgia: Secondary | ICD-10-CM

## 2021-05-22 ENCOUNTER — Other Ambulatory Visit: Payer: Self-pay | Admitting: Family Medicine

## 2021-05-25 ENCOUNTER — Other Ambulatory Visit: Payer: Self-pay

## 2021-05-25 ENCOUNTER — Encounter: Payer: Self-pay | Admitting: Registered Nurse

## 2021-05-25 ENCOUNTER — Ambulatory Visit: Payer: PRIVATE HEALTH INSURANCE | Admitting: Registered Nurse

## 2021-05-25 VITALS — BP 150/83 | HR 100 | Temp 98.4°F | Resp 18 | Ht 64.0 in | Wt 180.6 lb

## 2021-05-25 DIAGNOSIS — M542 Cervicalgia: Secondary | ICD-10-CM | POA: Diagnosis not present

## 2021-05-25 DIAGNOSIS — Z1329 Encounter for screening for other suspected endocrine disorder: Secondary | ICD-10-CM | POA: Diagnosis not present

## 2021-05-25 DIAGNOSIS — Z13220 Encounter for screening for lipoid disorders: Secondary | ICD-10-CM | POA: Diagnosis not present

## 2021-05-25 DIAGNOSIS — M791 Myalgia, unspecified site: Secondary | ICD-10-CM | POA: Diagnosis not present

## 2021-05-25 DIAGNOSIS — Z130 Encounter for screening for diseases of the blood and blood-forming organs and certain disorders involving the immune mechanism: Secondary | ICD-10-CM

## 2021-05-25 DIAGNOSIS — Z7689 Persons encountering health services in other specified circumstances: Secondary | ICD-10-CM

## 2021-05-25 DIAGNOSIS — Z13228 Encounter for screening for other metabolic disorders: Secondary | ICD-10-CM

## 2021-05-25 MED ORDER — TIZANIDINE HCL 4 MG PO TABS: 4.0000 mg | ORAL_TABLET | Freq: Four times a day (QID) | ORAL | 0 refills | Status: DC | PRN

## 2021-05-25 NOTE — Progress Notes (Signed)
Established Patient Office Visit  Subjective:  Patient ID: Haley Martin, female    DOB: Oct 26, 1966  Age: 55 y.o. MRN: [id number]  CC:  Chief Complaint  Patient presents with   Transitions Of Care    Patient states she is here for a TOC. Patient would like to discuss BP and Labs.    HPI [facility]aley [facility] M [facility]  Formerly Malva Coganody Martin PA patient  Anxiety, depression, adhd, managed by Dr. Evelene CroonKaur No acute concerns  Does note worsening neck pain with radicular symptoms and crepitus MRI in February shows changes at multiple levels Has done injections that give around 1 mo of relief Thinking she would want a surgical consult. Has not had one.  Otherwise, working on quitting smoking. About 4-5 cigarettes daily. Formerly 1 ppd.  Wants to quit on her own.  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 Likes work, finds it very fulfilling.   Divorced - 1 son who unfortunately Od'd 6 years ago.    Past Medical History:  Diagnosis Date   Adult ADHD    managed by Dr. Evelene CroonKaur   Anxiety and depression    Managed by Dr. Evelene CroonKaur.   BRVO (branch retinal vein occlusion) 2014   R eye 2014--followed by Dr. Luciana Axeankin.  MRI brain 11/19/15 showed mildly advanced generalized atrophy for age, +chronic small vessel ischemic changes, no acute finding.   Depression    Family history of colon cancer    11/2017 colonoscopy NORM.  Recall 5 yrs (Dr. Rhea BeltonPyrtle)   Postmenopausal vaginal bleeding 05/2017   Pelvic u/s 06/2017 with thickened endometrium---referred to GYN for endometrial biopsy.   Shingles 06/07/2019   L waistline    Stroke (HCC) 2014   rt.eye    Past Surgical History:  Procedure Laterality Date   APPENDECTOMY  02/28/2019   BREAST ENHANCEMENT SURGERY     Carotid dopplers  09/19/2015   NORMAL   chin implant     COLONOSCOPY  11/30/2017   NORMAL: recall 5 yrs ([facility])   FINGER SURGERY     cyst   LAPAROSCOPIC APPENDECTOMY N/A 03/01/2019   Procedure: APPENDECTOMY LAPAROSCOPIC WITH DRAIN  PLACEMENT;  Surgeon: Darnell LevelGerkin, Todd, MD;  Location: [facility];  Service: General;  Laterality: N/A;   NECK SURGERY  REMOTE past   lymph node excision--benign per pt report   SALIVARY GLAND SURGERY     stone    TMJ ARTHROPLASTY      Family History  Problem Relation Age of Onset   Hyperlipidemia Mother    Thyroid disease Mother    Colon polyps Mother    Heart attack Mother    Heart disease Mother    Stroke Father    Hypertension Father    Hyperlipidemia Father    Cancer Father        Lung   Lung cancer Father    Cancer Maternal Grandmother        colon cancer   Healthy Sister    Healthy Son    Healthy Daughter    Colon cancer Maternal Uncle    Breast cancer Paternal Aunt    Lung cancer Paternal Aunt    Bone cancer Paternal Aunt    Colon cancer Maternal Uncle    Esophageal cancer Neg Hx    Rectal cancer Neg Hx    Stomach cancer Neg Hx     Social History   Socioeconomic History   Marital status: Divorced    Spouse name: Not on file  Number of children: Not on file   Years of education: Not on file   Highest education level: Not on file  Occupational History   Not on file  Tobacco Use   Smoking status: Some Days    Packs/day: 0.20    Years: 2.00    Pack years: 0.40    Types: Cigarettes   Smokeless tobacco: Never  Vaping Use   Vaping Use: Every day   Substances: Flavoring  Substance and Sexual Activity   Alcohol use: Yes    Alcohol/week: 0.0 standard drinks    Comment: 1-3 beers a day   Drug use: No   Sexual activity: Not Currently    Birth control/protection: Abstinence  Other Topics Concern   Not on file  Social History Narrative   She works in the house cleaning business   She is divorced for 9 years   Has two children - daughter goes back and forth between.   Tob: 1 pack q 4d--current as of 05/2021.   Alc: 12 pack per week.   Right handed   One story home   Drinks caffeine   Social Determinants of Health   Financial Resource Strain: Not on file   Food Insecurity: Not on file  Transportation Needs: Not on file  Physical Activity: Not on file  Stress: Not on file  Social Connections: Not on file  Intimate Partner Violence: Not on file    Outpatient Medications Prior to Visit  Medication Sig Dispense Refill   amphetamine-dextroamphetamine (ADDERALL) 20 MG tablet Take 20 mg by mouth 3 (three) times daily.     ARIPiprazole (ABILIFY) 2 MG tablet Take 2 mg by mouth daily.     desvenlafaxine (PRISTIQ) 100 MG 24 hr tablet Take 100 mg by mouth every morning.     gabapentin (NEURONTIN) 400 MG capsule Take 400 mg by mouth in the morning, at noon, in the evening, and at bedtime.     gabapentin (NEURONTIN) 600 MG tablet Take by mouth.     hydrochlorothiazide (MICROZIDE) 12.5 MG capsule TAKE 1 CAPSULE BY MOUTH EVERY DAY 90 capsule 0   pantoprazole (PROTONIX) 40 MG tablet TAKE 1 TABLET BY MOUTH EVERY DAY 90 tablet 1   traZODone (DESYREL) 50 MG tablet      tiZANidine (ZANAFLEX) 4 MG tablet TAKE 1 TABLET BY MOUTH EVERY 6 HOURS AS NEEDED FOR MUSCLE SPASMS. 60 tablet 0   conjugated estrogens (PREMARIN) vaginal cream Discard plastic applicator. Insert blueberry size amount of cream on finger in vagina daily x1 week then 2x per week. (Patient not taking: Reported on 02/10/2021)     mirtazapine (REMERON) 30 MG tablet Take 30 mg by mouth at bedtime. (Patient not taking: No sig reported)     Multiple Vitamins-Minerals (CENTRUM SILVER PO) Take 1 tablet daily by mouth. (Patient not taking: No sig reported)     ALPRAZolam (XANAX) 1 MG tablet Take 1 mg by mouth 3 (three) times daily. (Patient not taking: Reported on 02/10/2021)     nitrofurantoin, macrocrystal-monohydrate, (MACROBID) 100 MG capsule Take 1 capsule (100 mg total) by mouth 2 (two) times daily. (Patient not taking: Reported on 05/25/2021) 14 capsule 0   valACYclovir (VALTREX) 1000 MG tablet Take 2 tablets by mouth every 12 hours (Patient not taking: Reported on 05/25/2021) 4 tablet 0   No  facility-administered medications prior to visit.    Allergies  Allergen Reactions   Penicillins Anaphylaxis   Sulfa Antibiotics Anaphylaxis    ROS Review of Systems  Constitutional:  Negative.   HENT: Negative.    Eyes: Negative.   Respiratory: Negative.    Cardiovascular: Negative.   Gastrointestinal: Negative.   Genitourinary: Negative.   Musculoskeletal: Negative.   Skin: Negative.   Neurological: Negative.   Psychiatric/Behavioral: Negative.    All other systems reviewed and are negative.    Objective:    Physical Exam Vitals and nursing note reviewed.  Constitutional:      General: She is not in acute distress.    Appearance: Normal appearance. She is normal weight. She is not ill-appearing, toxic-appearing or diaphoretic.  Cardiovascular:     Rate and Rhythm: Normal rate and regular rhythm.     Heart sounds: Normal heart sounds. No murmur heard.   No friction rub. No gallop.  Pulmonary:     Effort: Pulmonary effort is normal. No respiratory distress.     Breath sounds: Normal breath sounds. No stridor. No wheezing, rhonchi or rales.  Chest:     Chest wall: No tenderness.  Skin:    General: Skin is warm and dry.  Neurological:     General: No focal deficit present.     Mental Status: She is alert and oriented to person, place, and time. Mental status is at baseline.  Psychiatric:        Mood and Affect: Mood normal.        Behavior: Behavior normal.        Thought Content: Thought content normal.        Judgment: Judgment normal.    BP (!) 150/83   Pulse 100   Temp 98.4 F (36.9 C) (Temporal)   Resp 18   Ht 5\' 4"  (1.626 m)   Wt 180 lb 9.6 oz (81.9 kg)   LMP 12/11/2013 Comment: several years ago  SpO2 99%   BMI 31.00 kg/m  Wt Readings from Last 3 Encounters:  05/25/21 180 lb 9.6 oz (81.9 kg)  02/10/21 178 lb (80.7 kg)  01/12/21 173 lb 12.8 oz (78.8 kg)     There are no preventive care reminders to display for this patient.  There are no  preventive care reminders to display for this patient.  Lab Results  Component Value Date   TSH 1.31 04/17/2020   Lab Results  Component Value Date   WBC 7.5 04/17/2020   HGB 14.7 04/17/2020   HCT 41.9 04/17/2020   MCV 94.5 04/17/2020   PLT 231.0 04/17/2020   Lab Results  Component Value Date   NA 136 04/17/2020   K 3.6 04/17/2020   CO2 29 04/17/2020   GLUCOSE 169 (H) 04/17/2020   BUN 13 04/17/2020   CREATININE 0.63 04/17/2020   BILITOT 0.6 04/17/2020   ALKPHOS 122 (H) 04/17/2020   AST 25 04/17/2020   ALT 26 04/17/2020   PROT 7.1 04/17/2020   ALBUMIN 4.2 04/17/2020   CALCIUM 8.8 04/17/2020   ANIONGAP 6 03/02/2019   GFR 97.86 04/17/2020   Lab Results  Component Value Date   CHOL 167 04/17/2020   Lab Results  Component Value Date   HDL 48.00 04/17/2020   Lab Results  Component Value Date   LDLCALC 83 04/17/2020   Lab Results  Component Value Date   TRIG 183.0 (H) 04/17/2020   Lab Results  Component Value Date   CHOLHDL 3 04/17/2020   Lab Results  Component Value Date   HGBA1C 5.5 04/17/2020      Assessment & Plan:   Problem List Items Addressed This Visit   None Visit  Diagnoses     Screening for endocrine, metabolic and immunity disorder    -  Primary   Relevant Orders   CBC with Differential/Platelet   Comprehensive metabolic panel   Hemoglobin A1c   TSH   Cervicalgia       Relevant Medications   tiZANidine (ZANAFLEX) 4 MG tablet   Muscle tension pain       Relevant Medications   tiZANidine (ZANAFLEX) 4 MG tablet   Lipid screening       Relevant Orders   Lipid panel       Meds ordered this encounter  Medications   tiZANidine (ZANAFLEX) 4 MG tablet    Sig: Take 1 tablet (4 mg total) by mouth every 6 (six) hours as needed for muscle spasms.    Dispense:  60 tablet    Refill:  0    Follow-up: Return in about 2 weeks (around 06/08/2021) for lab only visit - within 1-2 weeks. Marland Kitchen   PLAN Labs - return fasting Refill  tizanidine Refer to neuro surg Encourage continue smoking cessation process Patient encouraged to call clinic with any questions, comments, or concerns.  Janeece Agee, NP

## 2021-05-25 NOTE — Patient Instructions (Addendum)
Ms. Gruwell -  Randie Heinz to meet you. Looking forward to working with you.   I'll reach out to Dr. [name] and see what he says.  Labs - please return fasting in the next 1-2 weeks. We can get a lab only slot booked for you  I've refilled the tizanidine. Let me know if there are any issues at the pharmacy  I'll be in touch soon to plan next follow up   Thank you  Rich     If you have lab work done today you will be contacted with your lab results within the next 2 weeks.  If you have not heard from [place] then please contact us. The fastest way to get your results is to register for My Chart.   IF you received an x-ray today, you will receive an invoice from [facility]. Please contact [facility] at [phone] with questions or concerns regarding your invoice.   IF you received labwork today, you will receive an invoice from [facility]. Please contact [facility] at [phone] with questions or concerns regarding your invoice.   Our billing staff will not be able to assist you with questions regarding bills from these companies.  You will be contacted with the lab results as soon as they are available. The fastest way to get your results is to activate your My Chart account. Instructions are located on the last page of this paperwork. If you have not heard from [place] regarding the results in 2 weeks, please contact this office.

## 2021-06-04 ENCOUNTER — Other Ambulatory Visit: Payer: No Typology Code available for payment source

## 2021-06-12 ENCOUNTER — Other Ambulatory Visit: Payer: Self-pay | Admitting: Registered Nurse

## 2021-06-12 DIAGNOSIS — M791 Myalgia, unspecified site: Secondary | ICD-10-CM

## 2021-06-12 DIAGNOSIS — M542 Cervicalgia: Secondary | ICD-10-CM

## 2021-06-12 NOTE — Telephone Encounter (Signed)
Last fill date 05/25/21 #60 with no refills Last office visit 05/25/21 Next office visit none

## 2021-06-26 ENCOUNTER — Other Ambulatory Visit: Payer: Self-pay | Admitting: Registered Nurse

## 2021-06-26 DIAGNOSIS — M791 Myalgia, unspecified site: Secondary | ICD-10-CM

## 2021-06-26 DIAGNOSIS — M542 Cervicalgia: Secondary | ICD-10-CM

## 2021-06-26 NOTE — Telephone Encounter (Signed)
Last filled 06/16/21 #60 with no refills Last visit 05/25/21 Next visit none

## 2021-07-07 ENCOUNTER — Other Ambulatory Visit: Payer: Self-pay | Admitting: Registered Nurse

## 2021-07-07 DIAGNOSIS — M542 Cervicalgia: Secondary | ICD-10-CM

## 2021-07-07 DIAGNOSIS — M791 Myalgia, unspecified site: Secondary | ICD-10-CM

## 2021-07-07 NOTE — Telephone Encounter (Signed)
Past 14 days  Signed 1 week ago (06/26/2021):  tiZANidine (ZANAFLEX) 4 MG tablet  Sig: TAKE 1 TABLET BY MOUTH EVERY 6 HOURS AS NEEDED FOR MUSCLE SPASMS.  Disp:  60 tablet    Refills:  0  LOV 05/25/21 NOV none

## 2021-07-17 ENCOUNTER — Encounter (HOSPITAL_BASED_OUTPATIENT_CLINIC_OR_DEPARTMENT_OTHER): Payer: Self-pay | Admitting: Emergency Medicine

## 2021-07-17 ENCOUNTER — Other Ambulatory Visit: Payer: Self-pay

## 2021-07-17 ENCOUNTER — Emergency Department (HOSPITAL_BASED_OUTPATIENT_CLINIC_OR_DEPARTMENT_OTHER): Payer: No Typology Code available for payment source

## 2021-07-17 ENCOUNTER — Emergency Department (HOSPITAL_BASED_OUTPATIENT_CLINIC_OR_DEPARTMENT_OTHER)
Admission: EM | Admit: 2021-07-17 | Discharge: 2021-07-17 | Disposition: A | Discharge status: Home / Self Care | Payer: No Typology Code available for payment source | Attending: Emergency Medicine | Admitting: Emergency Medicine

## 2021-07-17 DIAGNOSIS — F17210 Nicotine dependence, cigarettes, uncomplicated: Secondary | ICD-10-CM | POA: Insufficient documentation

## 2021-07-17 DIAGNOSIS — Z79899 Other long term (current) drug therapy: Secondary | ICD-10-CM | POA: Diagnosis not present

## 2021-07-17 DIAGNOSIS — I10 Essential (primary) hypertension: Secondary | ICD-10-CM | POA: Diagnosis not present

## 2021-07-17 DIAGNOSIS — M25562 Pain in left knee: Secondary | ICD-10-CM

## 2021-07-17 NOTE — ED Triage Notes (Signed)
Reports her large dog hit her on the left leg yesterday afternoon.  Having difficulty bearing weight since.  Has a brace on currently.

## 2021-07-17 NOTE — Discharge Instructions (Addendum)
Follow-up with sports medicine.  Xray shows no fracture. Suspect contusion or sprain as there is some inflammation seen on xray. Use knee brace and cane.  Recommend ice, Tylenol, ibuprofen.

## 2021-07-17 NOTE — ED Provider Notes (Signed)
[facility] EMERGENCY DEPARTMENT Provider Note   CSN: [id number] Arrival date & time: 07/17/21  0930     History Chief Complaint  Patient presents with   Knee Pain    Haley Martin is a 55 y.o. female.  Here with left knee pain after her dog ran into her left knee.  Has been able to ambulate with a walker.  Wearing a knee brace.  The history is provided by the patient.  Knee Pain Location:  Knee Time since incident:  2 days Knee location:  L knee Pain details:    Quality:  Aching   Timing:  Constant   Progression:  Unchanged Chronicity:  New Relieved by:  Nothing Worsened by:  Bearing weight Associated symptoms: no back pain, no decreased ROM, no fatigue, no fever, no itching, no muscle weakness, no neck pain, no numbness, no stiffness, no swelling and no tingling       Past Medical History:  Diagnosis Date   Adult ADHD    managed by Dr. Evelene Croon   Anxiety and depression    Managed by Dr. Evelene Croon.   BRVO (branch retinal vein occlusion) 2014   R eye 2014--followed by Dr. Luciana Axe.  MRI brain 11/19/15 showed mildly advanced generalized atrophy for age, +chronic small vessel ischemic changes, no acute finding.   Depression    Family history of colon cancer    11/2017 colonoscopy NORM.  Recall 5 yrs (Dr. Rhea Belton)   Postmenopausal vaginal bleeding 05/2017   Pelvic u/s 06/2017 with thickened endometrium---referred to GYN for endometrial biopsy.   Shingles 06/07/2019   L waistline    Stroke (HCC) 2014   rt.eye    Patient Active Problem List   Diagnosis Date Noted   Lymphocytic esophagitis 10/16/2020   Female genital prolapse 09/09/2020   OAB (overactive bladder) 09/09/2020   Vaginal atrophy 09/09/2020   Acute appendicitis with rupture 03/01/2019   Mood disorder (HCC) 10/23/2015   Depression 09/10/2015   Unspecified essential hypertension 01/15/2014   ADD (attention deficit disorder) 01/15/2014   Anxiety state, unspecified 01/15/2014    Past Surgical History:   Procedure Laterality Date   APPENDECTOMY  02/28/2019   BREAST ENHANCEMENT SURGERY     Carotid dopplers  09/19/2015   NORMAL   chin implant     COLONOSCOPY  11/30/2017   NORMAL: recall 5 yrs ([facility])   FINGER SURGERY     cyst   LAPAROSCOPIC APPENDECTOMY N/A 03/01/2019   Procedure: APPENDECTOMY LAPAROSCOPIC WITH DRAIN PLACEMENT;  Surgeon: Darnell Level, MD;  Location: [facility];  Service: General;  Laterality: N/A;   NECK SURGERY  REMOTE past   lymph node excision--benign per pt report   SALIVARY GLAND SURGERY     stone    TMJ ARTHROPLASTY       OB History     Gravida  2   Para  2   Term      Preterm      AB      Living         SAB      IAB      Ectopic      Multiple      Live Births              Family History  Problem Relation Age of Onset   Hyperlipidemia Mother    Thyroid disease Mother    Colon polyps Mother    Heart attack Mother    Heart disease Mother  Stroke Father    Hypertension Father    Hyperlipidemia Father    Cancer Father        Lung   Lung cancer Father    Cancer Maternal Grandmother        colon cancer   Healthy Sister    Healthy Son    Healthy Daughter    Colon cancer Maternal Uncle    Breast cancer Paternal Aunt    Lung cancer Paternal Aunt    Bone cancer Paternal Aunt    Colon cancer Maternal Uncle    Esophageal cancer Neg Hx    Rectal cancer Neg Hx    Stomach cancer Neg Hx     Social History   Tobacco Use   Smoking status: Some Days    Packs/day: 0.20    Years: 2.00    Pack years: 0.40    Types: Cigarettes   Smokeless tobacco: Never  Vaping Use   Vaping Use: Every day   Substances: Flavoring  Substance Use Topics   Alcohol use: Yes    Alcohol/week: 0.0 standard drinks    Comment: 1-3 beers a day   Drug use: No    Home Medications Prior to Admission medications   Medication Sig Start Date End Date Taking? Authorizing Provider  amphetamine-dextroamphetamine (ADDERALL) 20 MG tablet Take 20 mg by mouth 3  (three) times daily.    [provider]  ARIPiprazole (ABILIFY) 2 MG tablet Take 2 mg by mouth daily. 02/01/21   [provider]  conjugated estrogens (PREMARIN) vaginal cream Discard plastic applicator. Insert blueberry size amount of cream on finger in vagina daily x1 week then 2x per week. Patient not taking: Reported on 02/10/2021 09/09/20   [provider]  desvenlafaxine (PRISTIQ) 100 MG 24 hr tablet Take 100 mg by mouth every morning. 12/13/19   [provider]  gabapentin (NEURONTIN) 400 MG capsule Take 400 mg by mouth in the morning, at noon, in the evening, and at bedtime.    [provider]  gabapentin (NEURONTIN) 600 MG tablet Take by mouth. 08/25/20   [provider]  hydrochlorothiazide (MICROZIDE) 12.5 MG capsule TAKE 1 CAPSULE BY MOUTH EVERY DAY 05/22/21   Worthy Rancher B, FNP  mirtazapine (REMERON) 30 MG tablet Take 30 mg by mouth at bedtime. Patient not taking: No sig reported 02/13/20   [provider]  Multiple Vitamins-Minerals (CENTRUM SILVER PO) Take 1 tablet daily by mouth. Patient not taking: No sig reported    [provider]  pantoprazole (PROTONIX) 40 MG tablet TAKE 1 TABLET BY MOUTH EVERY DAY 02/10/21   Sheliah Hatch, MD  tiZANidine (ZANAFLEX) 4 MG tablet TAKE 1 TABLET BY MOUTH EVERY 6 HOURS AS NEEDED FOR MUSCLE SPASMS. 07/07/21   Janeece Agee, NP  traZODone (DESYREL) 50 MG tablet  04/15/20   [provider]    Allergies    Penicillins and Sulfa antibiotics  Review of Systems   Review of Systems  Constitutional:  Negative for fatigue and fever.  Musculoskeletal:  Positive for arthralgias and gait problem. Negative for back pain, joint swelling, myalgias, neck pain, neck stiffness and stiffness.  Skin:  Negative for color change, itching and wound.  Neurological:  Negative for weakness and numbness.   Physical Exam Updated Vital Signs BP 137/78 (BP Location: Right Arm)   Pulse 82    Temp 98.1 F (36.7 C) (Oral)   Resp 18   Ht 5\' 4"  (1.626 m)   Wt 79.4 kg  LMP 12/11/2013 Comment: several years ago  SpO2 96%   BMI 30.04 kg/m   Physical Exam Constitutional:      General: She is not in acute distress.    Appearance: She is not ill-appearing.  Cardiovascular:     Pulses: Normal pulses.  Musculoskeletal:        General: Tenderness present. No swelling or deformity. Normal range of motion.     Comments: Tenderness to the medial portion of the left knee  Skin:    General: Skin is warm.     Capillary Refill: Capillary refill takes less than 2 seconds.  Neurological:     General: No focal deficit present.     Mental Status: She is alert.     Sensory: No sensory deficit.     Motor: No weakness.    ED Results / Procedures / Treatments   Labs (all labs ordered are listed, but only abnormal results are displayed) Labs Reviewed - No data to display  EKG None  Radiology DG Knee Complete 4 Views Left  Result Date: 07/17/2021 CLINICAL DATA:  Fall.  Pain. EXAM: LEFT KNEE - COMPLETE 4+ VIEW COMPARISON:  No comparison studies available. FINDINGS: No evidence for an acute fracture or dislocation. Mild degenerative changes evident in all 3 compartments. Joint effusion noted suprapatellar recess. No worrisome lytic or sclerotic osseous abnormality. IMPRESSION: Joint effusion without acute bony abnormality. Electronically Signed   By: Kennith Center M.D.   On: 07/17/2021 10:27    Procedures Procedures   Medications Ordered in ED Medications - No data to display  ED Course  I have reviewed the triage vital signs and the nursing notes.  Pertinent labs & imaging results that were available during my care of the patient were reviewed by me and considered in my medical decision making (see chart for details).    MDM Rules/Calculators/A&P                           LATEEFA CROSBY is here with left knee pain.  States that her dog ran into her buckling her left knee.   Ambulatory with a cane and knee brace now.  Pain to the medial portion of the left knee.  No obvious major laxity.  X-ray negative for fracture.  Overall suspect contusion versus sprain.  Recommend ice, Tylenol, Motrin, ambulation with brace and cane.  We will have her follow-up with sports medicine.  Discharged in the ED in good condition.  Neurovascularly neuromuscularly intact.  This chart was dictated using voice recognition software.  Despite best efforts to proofread,  errors can occur which can change the documentation meaning.   Final Clinical Impression(s) / ED Diagnoses Final diagnoses:  Acute pain of left knee    Rx / DC Orders ED Discharge Orders     None        Virgina Norfolk, DO 07/17/21 1033

## 2021-07-19 ENCOUNTER — Other Ambulatory Visit: Payer: Self-pay | Admitting: Registered Nurse

## 2021-07-19 DIAGNOSIS — M542 Cervicalgia: Secondary | ICD-10-CM

## 2021-07-19 DIAGNOSIS — M791 Myalgia, unspecified site: Secondary | ICD-10-CM

## 2021-07-20 ENCOUNTER — Ambulatory Visit: Payer: Self-pay

## 2021-07-20 ENCOUNTER — Encounter: Payer: Self-pay | Admitting: Family Medicine

## 2021-07-20 ENCOUNTER — Ambulatory Visit (INDEPENDENT_AMBULATORY_CARE_PROVIDER_SITE_OTHER): Payer: No Typology Code available for payment source | Admitting: Family Medicine

## 2021-07-20 ENCOUNTER — Other Ambulatory Visit: Payer: Self-pay

## 2021-07-20 VITALS — BP 120/98 | Ht 64.0 in | Wt 175.0 lb

## 2021-07-20 DIAGNOSIS — S83412A Sprain of medial collateral ligament of left knee, initial encounter: Secondary | ICD-10-CM

## 2021-07-20 DIAGNOSIS — M25462 Effusion, left knee: Secondary | ICD-10-CM | POA: Diagnosis not present

## 2021-07-20 DIAGNOSIS — M25561 Pain in right knee: Secondary | ICD-10-CM

## 2021-07-20 MED ORDER — TRIAMCINOLONE ACETONIDE 40 MG/ML IJ SUSP
40.0000 mg | Freq: Once | INTRAMUSCULAR | Status: AC
  Administered 2021-07-20: 40 mg via INTRA_ARTICULAR

## 2021-07-20 NOTE — Patient Instructions (Signed)
Nice to meet you Please try the brace and crutches  Please try ice   Please send me a message in MyChart with any questions or updates.  Please see me back in 2 weeks.   --Dr. Jordan Likes

## 2021-07-20 NOTE — Progress Notes (Signed)
Haley Martin - 55 y.o. female MRN [id number]  Date of birth: May 18, 1966  SUBJECTIVE:  Including CC & ROS.  No chief complaint on file.   Haley Martin is a 55 y.o. female that is presenting with acute left knee pain.  Her dog hit her from the lateral aspect of the knee.  Since that time she has been unable to bear weight.  Having limited range of motion and significant pain at the knee.  No history of similar pain.  Independent review of the left knee x-ray from 8/5 shows a joint effusion   Review of Systems See HPI   HISTORY: Past Medical, Surgical, Social, and Family History Reviewed & Updated per EMR.   Pertinent Historical Findings include:  Past Medical History:  Diagnosis Date   Adult ADHD    managed by Dr. Evelene Croon   Anxiety and depression    Managed by Dr. Evelene Croon.   BRVO (branch retinal vein occlusion) 2014   R eye 2014--followed by Dr. Luciana Axe.  MRI brain 11/19/15 showed mildly advanced generalized atrophy for age, +chronic small vessel ischemic changes, no acute finding.   Depression    Family history of colon cancer    11/2017 colonoscopy NORM.  Recall 5 yrs (Dr. Rhea Belton)   Postmenopausal vaginal bleeding 05/2017   Pelvic u/s 06/2017 with thickened endometrium---referred to GYN for endometrial biopsy.   Shingles 06/07/2019   L waistline    Stroke (HCC) 2014   rt.eye    Past Surgical History:  Procedure Laterality Date   APPENDECTOMY  02/28/2019   BREAST ENHANCEMENT SURGERY     Carotid dopplers  09/19/2015   NORMAL   chin implant     COLONOSCOPY  11/30/2017   NORMAL: recall 5 yrs ([facility])   FINGER SURGERY     cyst   LAPAROSCOPIC APPENDECTOMY N/A 03/01/2019   Procedure: APPENDECTOMY LAPAROSCOPIC WITH DRAIN PLACEMENT;  Surgeon: Darnell Level, MD;  Location: [facility];  Service: General;  Laterality: N/A;   NECK SURGERY  REMOTE past   lymph node excision--benign per pt report   SALIVARY GLAND SURGERY     stone    TMJ ARTHROPLASTY      Family History  Problem Relation  Age of Onset   Hyperlipidemia Mother    Thyroid disease Mother    Colon polyps Mother    Heart attack Mother    Heart disease Mother    Stroke Father    Hypertension Father    Hyperlipidemia Father    Cancer Father        Lung   Lung cancer Father    Cancer Maternal Grandmother        colon cancer   Healthy Sister    Healthy Son    Healthy Daughter    Colon cancer Maternal Uncle    Breast cancer Paternal Aunt    Lung cancer Paternal Aunt    Bone cancer Paternal Aunt    Colon cancer Maternal Uncle    Esophageal cancer Neg Hx    Rectal cancer Neg Hx    Stomach cancer Neg Hx     Social History   Socioeconomic History   Marital status: Divorced    Spouse name: Not on file   Number of children: Not on file   Years of education: Not on file   Highest education level: Not on file  Occupational History   Not on file  Tobacco Use   Smoking status: Some Days    Packs/day: 0.20  Years: 2.00    Pack years: 0.40    Types: Cigarettes   Smokeless tobacco: Never  Vaping Use   Vaping Use: Every day   Substances: Flavoring  Substance and Sexual Activity   Alcohol use: Yes    Alcohol/week: 0.0 standard drinks    Comment: 1-3 beers a day   Drug use: No   Sexual activity: Not Currently    Birth control/protection: Abstinence  Other Topics Concern   Not on file  Social History Narrative   She works in the house cleaning business   She is divorced for 9 years   Has two children - daughter goes back and forth between.   Tob: 1 pack q 4d--current as of 05/2021.   Alc: 12 pack per week.   Right handed   One story home   Drinks caffeine   Social Determinants of Health   Financial Resource Strain: Not on file  Food Insecurity: Not on file  Transportation Needs: Not on file  Physical Activity: Not on file  Stress: Not on file  Social Connections: Not on file  Intimate Partner Violence: Not on file     PHYSICAL EXAM:  VS: BP (!) 120/98 (BP Location: Left Arm, Patient  Position: Sitting, Cuff Size: Large)   Ht 5\' 4"  (1.626 m)   Wt 175 lb (79.4 kg)   LMP 12/11/2013 Comment: several years ago  BMI 30.04 kg/m  Physical Exam Gen: NAD, alert, cooperative with exam, well-appearing MSK:  Left knee: Mild effusion. Limited flexion and extension. Instability with valgus and varus stress testing. Positive Lachman's test. Neurovascular intact  Limited ultrasound: Left knee:  Effusion noted. Normal-appearing quadricep and patellar tendon. Mild degenerative changes of medial meniscus. Hyperemia at the origin of the MCL to indicate sprain.   Summary: Effusion and MCL sprain  Ultrasound and interpretation by 12/13/2013, MD   Aspiration/Injection Procedure Note Haley Martin [birth year]  Procedure: Aspiration and Injection Indications: Left knee pain  Procedure Details Consent: Risks of procedure as well as the alternatives and risks of each were explained to the (patient/caregiver).  Consent for procedure obtained. Time Out: Verified patient identification, verified procedure, site/side was marked, verified correct patient position, special equipment/implants available, medications/allergies/relevent history reviewed, required imaging and test results available.  Performed.  The area was cleaned with iodine and alcohol swabs.    The left knee superior lateral suprapatellar pouch was injected using 3 cc 1% lidocaine on a 25-gauge 1-1/2 inch needle.  An 18-gauge 1-1/2 inch needle was used to achieve aspiration.  The syringe was switched and a mixture containing 1 cc's of 40 mg Kenalog and 4 cc's of 0.25% bupivacaine was injected.  Ultrasound was used. Images were obtained in long views showing the injection.    Amount of Fluid Aspirated: 9mL Character of Fluid: bloody Fluid was sent for: n/a  A sterile dressing was applied.  Patient did tolerate procedure well.    ASSESSMENT & PLAN:   Sprain of medial collateral ligament of left knee Initial  injury on 8/3.  Does appear to have changes of the MCL but no complete rupture. -Counseled on home exercise therapy and supportive care. -Hinged knee brace.   Knee effusion, left Initial injury on 8/3.  Hemarthrosis on aspiration.  Her dog ran into the lateral aspect of her knee to initiate her pain.  Concern for ACL tear given bloody tap. -Counseled on home exercise therapy and supportive care. -Aspiration and injection today. -Crutches -May consider further imaging.

## 2021-07-21 DIAGNOSIS — T148XXA Other injury of unspecified body region, initial encounter: Secondary | ICD-10-CM | POA: Insufficient documentation

## 2021-07-21 DIAGNOSIS — M25462 Effusion, left knee: Secondary | ICD-10-CM | POA: Insufficient documentation

## 2021-07-21 DIAGNOSIS — S83412A Sprain of medial collateral ligament of left knee, initial encounter: Secondary | ICD-10-CM | POA: Insufficient documentation

## 2021-07-21 NOTE — Assessment & Plan Note (Signed)
Initial injury on 8/3.  Does appear to have changes of the MCL but no complete rupture. -Counseled on home exercise therapy and supportive care. -Hinged knee brace.

## 2021-07-21 NOTE — Assessment & Plan Note (Signed)
Initial injury on 8/3.  Hemarthrosis on aspiration.  Her dog ran into the lateral aspect of her knee to initiate her pain.  Concern for ACL tear given bloody tap. -Counseled on home exercise therapy and supportive care. -Aspiration and injection today. -Crutches -May consider further imaging.

## 2021-08-01 ENCOUNTER — Other Ambulatory Visit: Payer: Self-pay | Admitting: Family Medicine

## 2021-08-03 ENCOUNTER — Ambulatory Visit (INDEPENDENT_AMBULATORY_CARE_PROVIDER_SITE_OTHER): Payer: No Typology Code available for payment source | Admitting: Family Medicine

## 2021-08-03 ENCOUNTER — Encounter: Payer: Self-pay | Admitting: Family Medicine

## 2021-08-03 ENCOUNTER — Other Ambulatory Visit: Payer: Self-pay

## 2021-08-03 VITALS — BP 110/80 | Ht 64.0 in | Wt 175.0 lb

## 2021-08-03 DIAGNOSIS — M25462 Effusion, left knee: Secondary | ICD-10-CM

## 2021-08-03 NOTE — Patient Instructions (Signed)
Good to see you Please call [phone] to schedule the mri   Please send me a message in MyChart with any questions or updates.  We will setup a virtual visit once the MRI is resulted.   --Dr. Jordan Likes

## 2021-08-03 NOTE — Assessment & Plan Note (Signed)
Pain stems from an injury she was involved with.  Concern for internal derangement given hemarthrosis and traumatic origin -Counseled on home exercise therapy and supportive care. -Counseled on brace. -MRI to evaluate for internal derangement.

## 2021-08-03 NOTE — Progress Notes (Signed)
Haley Martin - 55 y.o. female MRN [id number]  Date of birth: 1966-02-24  SUBJECTIVE:  Including CC & ROS.  No chief complaint on file.   Haley Martin is a 55 y.o. female that is presenting with ongoing left knee pain following the trauma.  She had a hemarthrosis and injection.  Having limitations in her range of motion with mechanical symptoms.   Review of Systems See HPI   HISTORY: Past Medical, Surgical, Social, and Family History Reviewed & Updated per EMR.   Pertinent Historical Findings include:  Past Medical History:  Diagnosis Date   Adult ADHD    managed by Dr. Evelene Croon   Anxiety and depression    Managed by Dr. Evelene Croon.   BRVO (branch retinal vein occlusion) 2014   R eye 2014--followed by Dr. Luciana Axe.  MRI brain 11/19/15 showed mildly advanced generalized atrophy for age, +chronic small vessel ischemic changes, no acute finding.   Depression    Family history of colon cancer    11/2017 colonoscopy NORM.  Recall 5 yrs (Dr. Rhea Belton)   Postmenopausal vaginal bleeding 05/2017   Pelvic u/s 06/2017 with thickened endometrium---referred to GYN for endometrial biopsy.   Shingles 06/07/2019   L waistline    Stroke (HCC) 2014   rt.eye    Past Surgical History:  Procedure Laterality Date   APPENDECTOMY  02/28/2019   BREAST ENHANCEMENT SURGERY     Carotid dopplers  09/19/2015   NORMAL   chin implant     COLONOSCOPY  11/30/2017   NORMAL: recall 5 yrs ([facility])   FINGER SURGERY     cyst   LAPAROSCOPIC APPENDECTOMY N/A 03/01/2019   Procedure: APPENDECTOMY LAPAROSCOPIC WITH DRAIN PLACEMENT;  Surgeon: Darnell Level, MD;  Location: [facility];  Service: General;  Laterality: N/A;   NECK SURGERY  REMOTE past   lymph node excision--benign per pt report   SALIVARY GLAND SURGERY     stone    TMJ ARTHROPLASTY      Family History  Problem Relation Age of Onset   Hyperlipidemia Mother    Thyroid disease Mother    Colon polyps Mother    Heart attack Mother    Heart disease Mother    Stroke  Father    Hypertension Father    Hyperlipidemia Father    Cancer Father        Lung   Lung cancer Father    Cancer Maternal Grandmother        colon cancer   Healthy Sister    Healthy Son    Healthy Daughter    Colon cancer Maternal Uncle    Breast cancer Paternal Aunt    Lung cancer Paternal Aunt    Bone cancer Paternal Aunt    Colon cancer Maternal Uncle    Esophageal cancer Neg Hx    Rectal cancer Neg Hx    Stomach cancer Neg Hx     Social History   Socioeconomic History   Marital status: Divorced    Spouse name: Not on file   Number of children: Not on file   Years of education: Not on file   Highest education level: Not on file  Occupational History   Not on file  Tobacco Use   Smoking status: Some Days    Packs/day: 0.20    Years: 2.00    Pack years: 0.40    Types: Cigarettes   Smokeless tobacco: Never  Vaping Use   Vaping Use: Every day   Substances: Flavoring  Substance and Sexual Activity   Alcohol use: Yes    Alcohol/week: 0.0 standard drinks    Comment: 1-3 beers a day   Drug use: No   Sexual activity: Not Currently    Birth control/protection: Abstinence  Other Topics Concern   Not on file  Social History Narrative   She works in the house cleaning business   She is divorced for 9 years   Has two children - daughter goes back and forth between.   Tob: 1 pack q 4d--current as of 05/2021.   Alc: 12 pack per week.   Right handed   One story home   Drinks caffeine   Social Determinants of Health   Financial Resource Strain: Not on file  Food Insecurity: Not on file  Transportation Needs: Not on file  Physical Activity: Not on file  Stress: Not on file  Social Connections: Not on file  Intimate Partner Violence: Not on file     PHYSICAL EXAM:  VS: BP 110/80 (BP Location: Left Arm, Patient Position: Sitting, Cuff Size: Normal)   Ht 5\' 4"  (1.626 m)   Wt 175 lb (79.4 kg)   LMP 12/11/2013 Comment: several years ago  BMI 30.04 kg/m   Physical Exam Gen: NAD, alert, cooperative with exam, well-appearing MSK:  Left knee: Limited extension and flexion. Effusion noted. Positive Lachman's test. Neurovascular intact     ASSESSMENT & PLAN:   Knee effusion, left Pain stems from an injury she was involved with.  Concern for internal derangement given hemarthrosis and traumatic origin -Counseled on home exercise therapy and supportive care. -Counseled on brace. -MRI to evaluate for internal derangement.

## 2021-08-10 ENCOUNTER — Ambulatory Visit: Payer: Self-pay

## 2021-08-10 ENCOUNTER — Encounter: Payer: Self-pay | Admitting: Family Medicine

## 2021-08-10 ENCOUNTER — Ambulatory Visit (INDEPENDENT_AMBULATORY_CARE_PROVIDER_SITE_OTHER): Payer: No Typology Code available for payment source | Admitting: Family Medicine

## 2021-08-10 ENCOUNTER — Other Ambulatory Visit: Payer: Self-pay

## 2021-08-10 VITALS — Ht 64.0 in | Wt 175.0 lb

## 2021-08-10 DIAGNOSIS — M25462 Effusion, left knee: Secondary | ICD-10-CM | POA: Diagnosis not present

## 2021-08-10 MED ORDER — KETOROLAC TROMETHAMINE 30 MG/ML IJ SOLN
30.0000 mg | Freq: Once | INTRAMUSCULAR | Status: AC
  Administered 2021-08-10: 30 mg via INTRAMUSCULAR

## 2021-08-10 MED ORDER — HYDROCODONE-ACETAMINOPHEN 5-325 MG PO TABS: 1.0000 | ORAL_TABLET | Freq: Three times a day (TID) | ORAL | 0 refills | Status: DC | PRN

## 2021-08-10 NOTE — Patient Instructions (Signed)
Good to see you Please use the brace  Please ice  Please use the pain medicine as needed   Please send me a message in MyChart with any questions or updates.  We will setup a virtual visit once the MRi is resulted.   --Dr. Jordan Likes

## 2021-08-10 NOTE — Progress Notes (Signed)
Haley Martin - 55 y.o. female MRN [id number]  Date of birth: Apr 23, 1966  SUBJECTIVE:  Including CC & ROS.  No chief complaint on file.   Haley Martin is a 55 y.o. female that is presenting with Changes to her knee.  She had a fall onto the left knee.  She has been dealing with pain in the knee already has an MRI ordered.  Having significant pain and inability to extend and flex the knee..    Review of Systems See HPI   HISTORY: Past Medical, Surgical, Social, and Family History Reviewed & Updated per EMR.   Pertinent Historical Findings include:  Past Medical History:  Diagnosis Date   Adult ADHD    managed by Dr. Evelene Croon   Anxiety and depression    Managed by Dr. Evelene Croon.   BRVO (branch retinal vein occlusion) 2014   R eye 2014--followed by Dr. Luciana Axe.  MRI brain 11/19/15 showed mildly advanced generalized atrophy for age, +chronic small vessel ischemic changes, no acute finding.   Depression    Family history of colon cancer    11/2017 colonoscopy NORM.  Recall 5 yrs (Dr. Rhea Belton)   Postmenopausal vaginal bleeding 05/2017   Pelvic u/s 06/2017 with thickened endometrium---referred to GYN for endometrial biopsy.   Shingles 06/07/2019   L waistline    Stroke (HCC) 2014   rt.eye    Past Surgical History:  Procedure Laterality Date   APPENDECTOMY  02/28/2019   BREAST ENHANCEMENT SURGERY     Carotid dopplers  09/19/2015   NORMAL   chin implant     COLONOSCOPY  11/30/2017   NORMAL: recall 5 yrs ([facility])   FINGER SURGERY     cyst   LAPAROSCOPIC APPENDECTOMY N/A 03/01/2019   Procedure: APPENDECTOMY LAPAROSCOPIC WITH DRAIN PLACEMENT;  Surgeon: Darnell Level, MD;  Location: [facility];  Service: General;  Laterality: N/A;   NECK SURGERY  REMOTE past   lymph node excision--benign per pt report   SALIVARY GLAND SURGERY     stone    TMJ ARTHROPLASTY      Family History  Problem Relation Age of Onset   Hyperlipidemia Mother    Thyroid disease Mother    Colon polyps Mother    Heart  attack Mother    Heart disease Mother    Stroke Father    Hypertension Father    Hyperlipidemia Father    Cancer Father        Lung   Lung cancer Father    Cancer Maternal Grandmother        colon cancer   Healthy Sister    Healthy Son    Healthy Daughter    Colon cancer Maternal Uncle    Breast cancer Paternal Aunt    Lung cancer Paternal Aunt    Bone cancer Paternal Aunt    Colon cancer Maternal Uncle    Esophageal cancer Neg Hx    Rectal cancer Neg Hx    Stomach cancer Neg Hx     Social History   Socioeconomic History   Marital status: Divorced    Spouse name: Not on file   Number of children: Not on file   Years of education: Not on file   Highest education level: Not on file  Occupational History   Not on file  Tobacco Use   Smoking status: Some Days    Packs/day: 0.20    Years: 2.00    Pack years: 0.40    Types: Cigarettes   Smokeless  tobacco: Never  Vaping Use   Vaping Use: Every day   Substances: Flavoring  Substance and Sexual Activity   Alcohol use: Yes    Alcohol/week: 0.0 standard drinks    Comment: 1-3 beers a day   Drug use: No   Sexual activity: Not Currently    Birth control/protection: Abstinence  Other Topics Concern   Not on file  Social History Narrative   She works in the house cleaning business   She is divorced for 9 years   Has two children - daughter goes back and forth between.   Tob: 1 pack q 4d--current as of 05/2021.   Alc: 12 pack per week.   Right handed   One story home   Drinks caffeine   Social Determinants of Health   Financial Resource Strain: Not on file  Food Insecurity: Not on file  Transportation Needs: Not on file  Physical Activity: Not on file  Stress: Not on file  Social Connections: Not on file  Intimate Partner Violence: Not on file     PHYSICAL EXAM:  VS: Ht 5\' 4"  (1.626 m)   Wt 175 lb (79.4 kg)   LMP 12/11/2013 Comment: several years ago  BMI 30.04 kg/m  Physical Exam Gen: NAD, alert,  cooperative with exam, well-appearing   Limited ultrasound: Left knee:  Mild to moderate effusion. Normal-appearing quadricep and patellar tendon. Mild prepatellar bursitis  Summary: Effusion noted  Ultrasound and interpretation by 12/13/2013, MD     ASSESSMENT & PLAN:   Knee effusion, left Acutely worsened after a fall directly onto the knee earlier today.  Severe pain and ongoing mechanical symptoms.  Has MRI ordered. -Counseled on home exercise therapy and supportive care. -Counseled on brace. -IM Toradol. -Norco.

## 2021-08-11 NOTE — Assessment & Plan Note (Signed)
Acutely worsened after a fall directly onto the knee earlier today.  Severe pain and ongoing mechanical symptoms.  Has MRI ordered. -Counseled on home exercise therapy and supportive care. -Counseled on brace. -IM Toradol. -Norco.

## 2021-08-14 ENCOUNTER — Other Ambulatory Visit: Payer: Self-pay | Admitting: Family Medicine

## 2021-08-14 DIAGNOSIS — M25462 Effusion, left knee: Secondary | ICD-10-CM

## 2021-08-14 NOTE — Progress Notes (Signed)
R

## 2021-08-20 ENCOUNTER — Telehealth: Payer: Self-pay | Admitting: *Deleted

## 2021-08-20 NOTE — Telephone Encounter (Signed)
I called pt's ins plan and changed the servicing provider for her left knee MRI from [facility] to [facility] @ [phone], [place].  Auth # [id number]. Valid 08/11/21- 09/10/21. [facility] notified of this auth via fax # [phone].   [facility] call ref #: [id number]

## 2021-08-26 ENCOUNTER — Telehealth (INDEPENDENT_AMBULATORY_CARE_PROVIDER_SITE_OTHER): Payer: No Typology Code available for payment source | Admitting: Family Medicine

## 2021-08-26 ENCOUNTER — Other Ambulatory Visit: Payer: Self-pay

## 2021-08-26 ENCOUNTER — Other Ambulatory Visit: Payer: Self-pay | Admitting: Family

## 2021-08-26 ENCOUNTER — Encounter: Payer: Self-pay | Admitting: Family Medicine

## 2021-08-26 DIAGNOSIS — T148XXA Other injury of unspecified body region, initial encounter: Secondary | ICD-10-CM | POA: Diagnosis not present

## 2021-08-26 NOTE — Assessment & Plan Note (Signed)
Has subchondral edema of the medial tibial plateau.  No ligamentous or meniscal tear. -Counseled on home exercise therapy and supportive care. -She will work on her range of motion.  May need to consider physical therapy.

## 2021-08-26 NOTE — Progress Notes (Signed)
Virtual Visit via Video Note  I connected with Haley Martin on 08/26/21 at  1:30 PM EDT by a video enabled telemedicine application and verified that I am speaking with the correct person using two identifiers.  Location: Patient: home Provider: office   I discussed the limitations of evaluation and management by telemedicine and the availability of in person appointments. The patient expressed understanding and agreed to proceed.  History of Present Illness:  Haley Martin is a 55 year old female is following up after the MRI of her left knee.  This was demonstrating chronic subchondral edema along the medial tibial plateau but no actual fracture.  Observations/Objective:   Assessment and Plan:  Contusion of left knee: Has subchondral edema of the medial tibial plateau.  No ligamentous or meniscal tear. -Counseled on home exercise therapy and supportive care. -She will work on her range of motion.  May need to consider physical therapy.   Follow Up Instructions:    I discussed the assessment and treatment plan with the patient. The patient was provided an opportunity to ask questions and all were answered. The patient agreed with the plan and demonstrated an understanding of the instructions.   The patient was advised to call back or seek an in-person evaluation if the symptoms worsen or if the condition fails to improve as anticipated.    Clare Gandy, MD

## 2022-02-02 IMAGING — US US THYROID
1 series · 14 of 25 positions shown · non-contrast
Comparison: None.

CLINICAL DATA: Thyroid fullness.

EXAM:
THYROID ULTRASOUND
TECHNIQUE: Ultrasound examination of the thyroid gland and adjacent soft
tissues was performed.

[Series 1: us thyroid · 36 acquisitions, 14 frames shown]
[im 1/36]
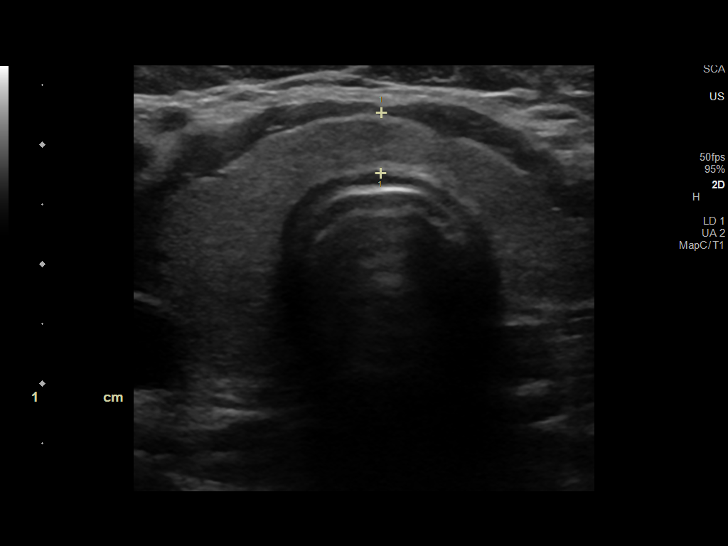
[im 3/36]
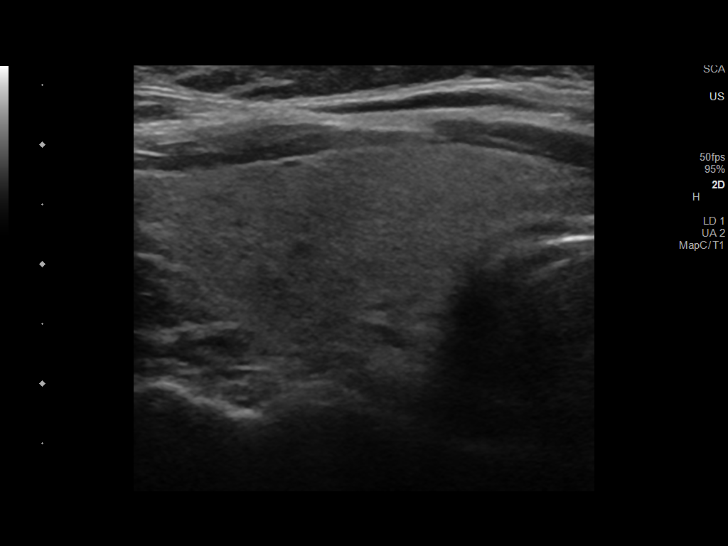
[im 6/36]
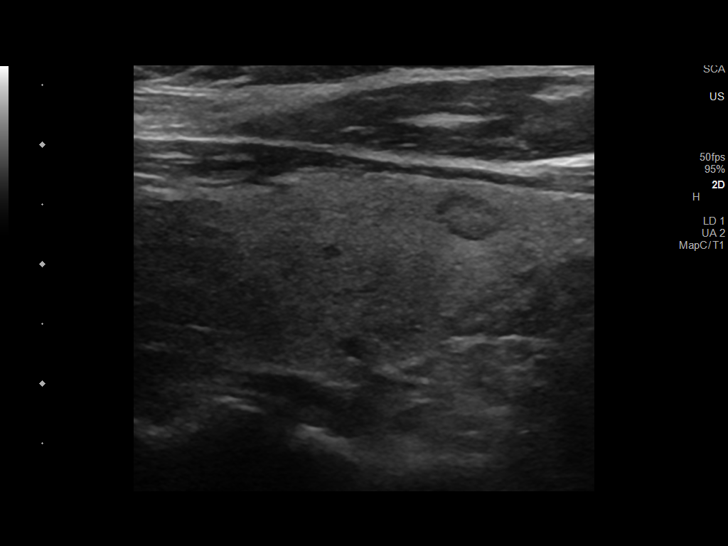
[im 9/36]
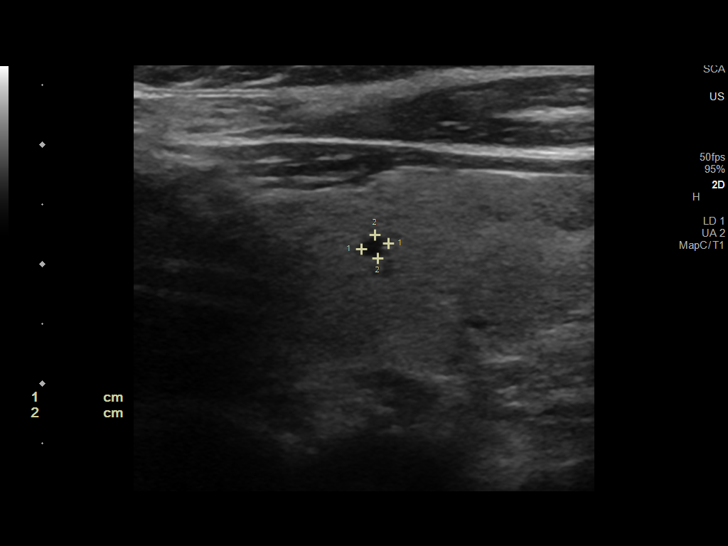
[im 12/36]
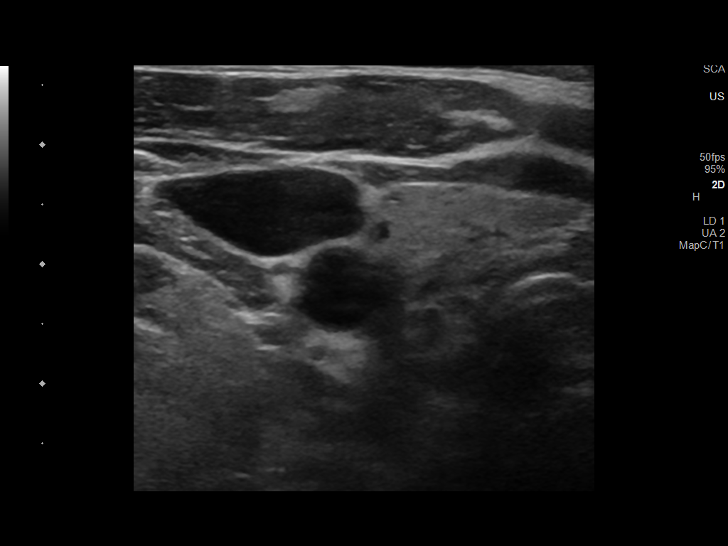
[im 14/36]
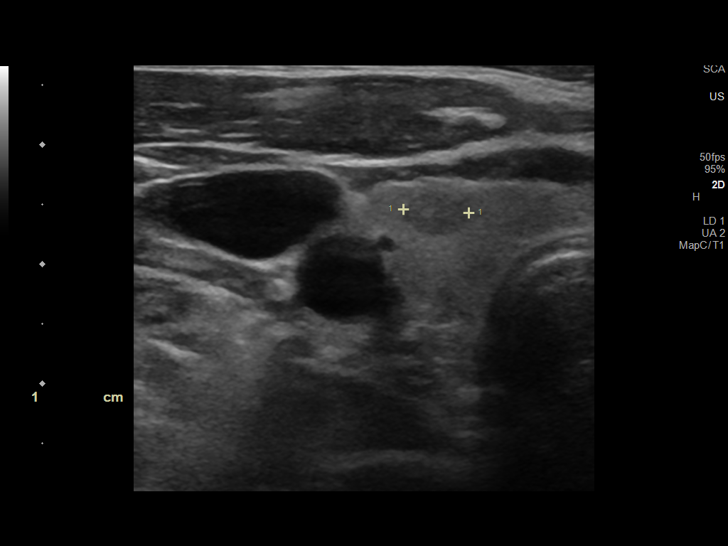
[im 17/36]
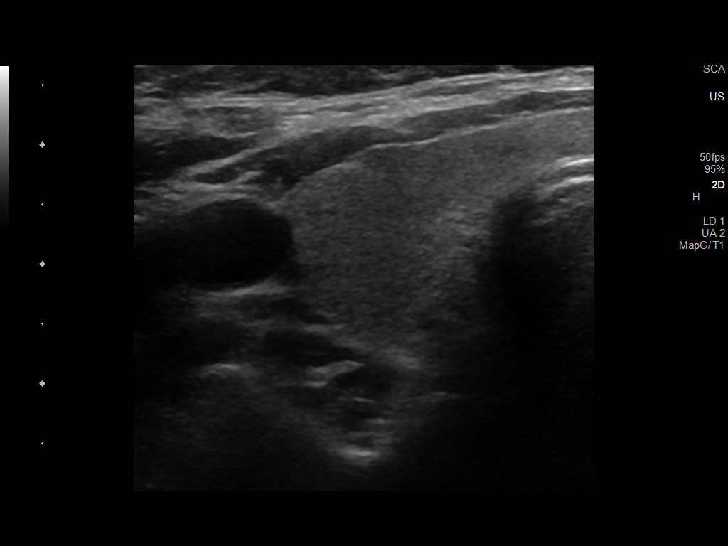
[im 19/36]
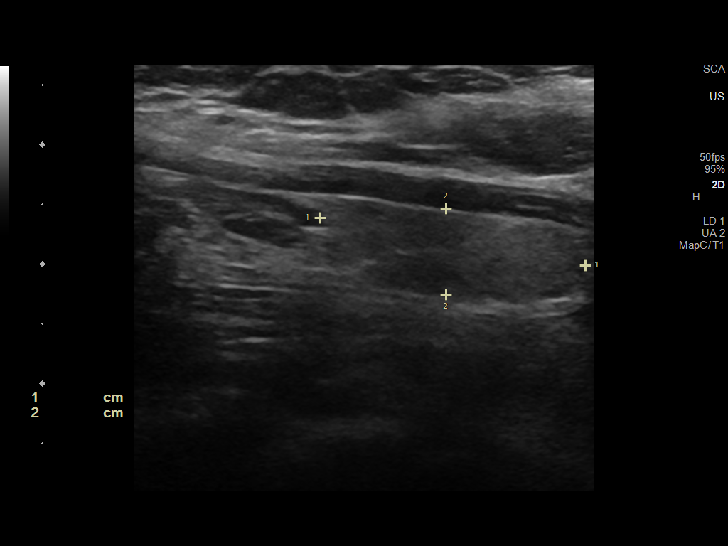
[im 22/36]
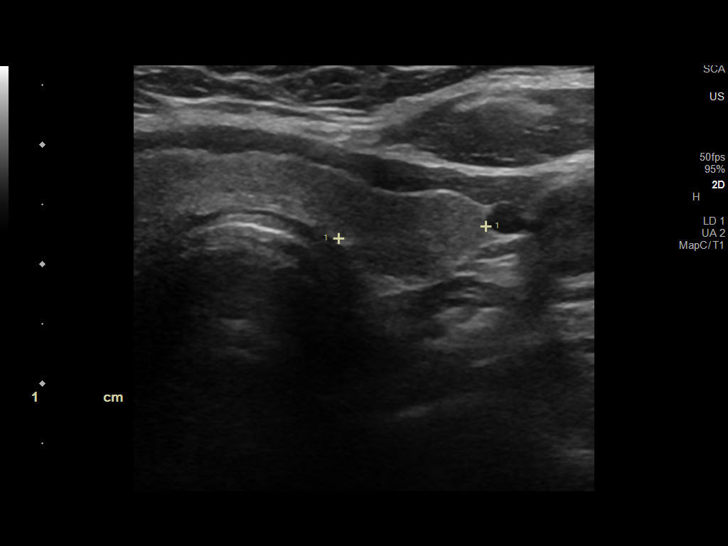
[im 24/36]
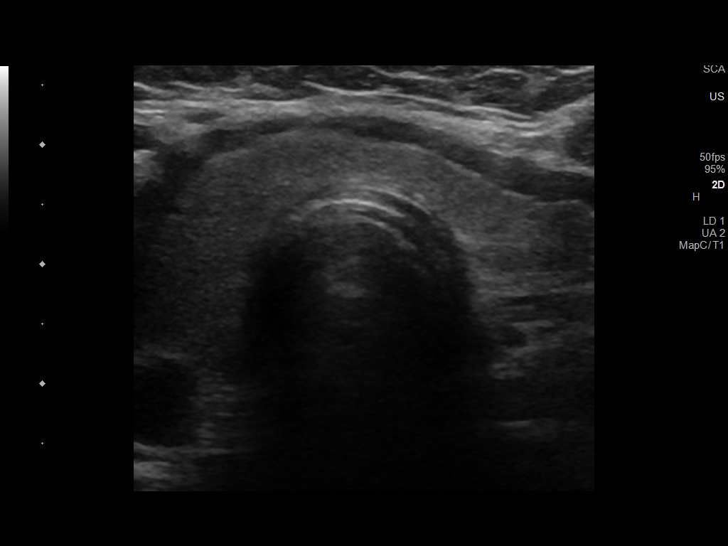
[im 27/36]
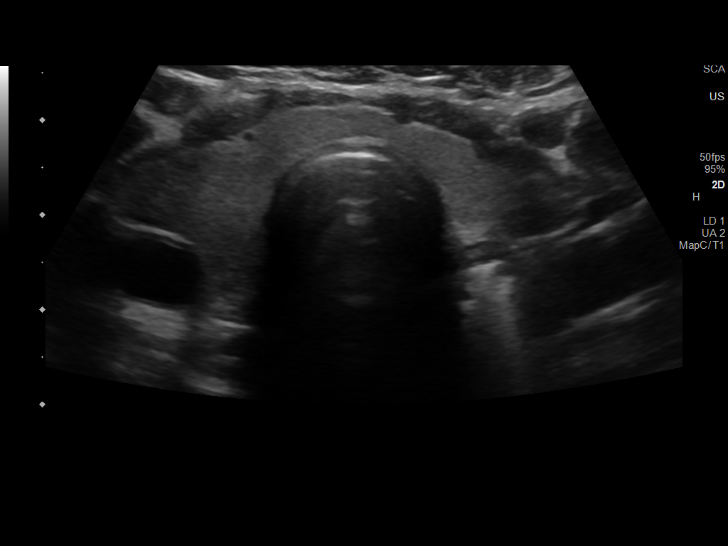
[im 30/36]
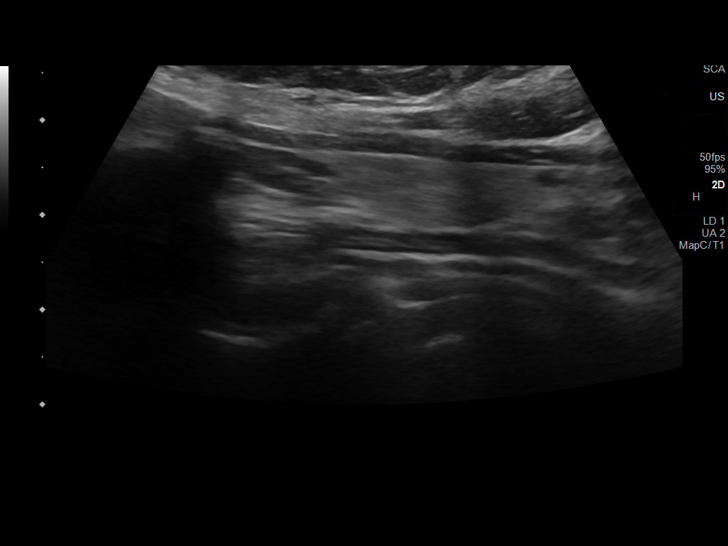
[im 33/36]
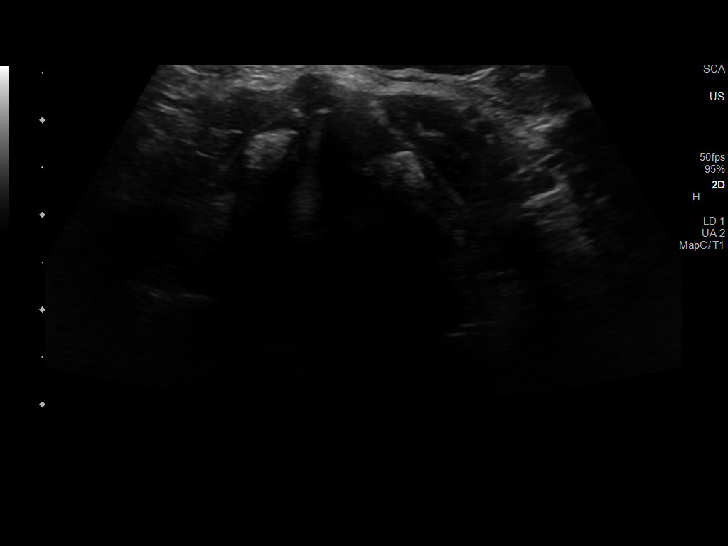
[im 36/36]
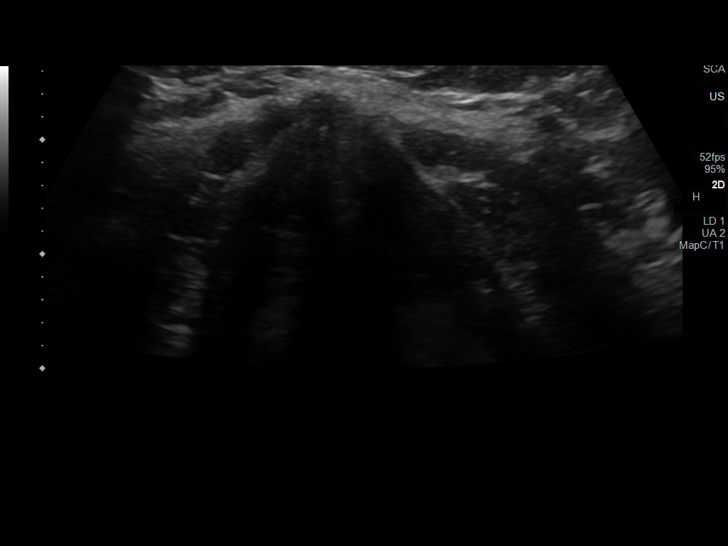

[14 of 25 positions shown; findings below may reference images not displayed]

FINDINGS: Parenchymal Echotexture: Normal

Isthmus: 0.5 cm

Right lobe: 3.5 x 1.7 x 1.3 cm

Left lobe: 2.3 x 0.7 x 1.2 cm

_________________________________________________________

Estimated total number of nodules >/= 1 cm: 0

Number of spongiform nodules >/=  2 cm not described below (TR1): 0

Number of mixed cystic and solid nodules >/= 1.5 cm not described
below (TR2): 0

_________________________________________________________

There are multiple small subcentimeter right-sided thyroid nodules.
IMPRESSION: 1. Normal sized thyroid gland.
2. There are multiple subcentimeter right-sided thyroid nodules,
none of which meet criteria for FNA or follow-up ultrasound.

The above is in keeping with the ACR TI-RADS recommendations - [HOSPITAL] 5388;[DATE].

## 2022-12-17 ENCOUNTER — Encounter: Payer: Self-pay | Admitting: Internal Medicine

## 2022-12-27 ENCOUNTER — Encounter: Payer: Self-pay | Admitting: Internal Medicine

## 2023-03-28 ENCOUNTER — Encounter: Payer: Self-pay | Admitting: *Deleted

## 2023-06-11 IMAGING — CR DG KNEE COMPLETE 4+V*L*
4 series · 4 of 4 positions shown · non-contrast
Comparison: No comparison studies available.

CLINICAL DATA: Fall.  Pain.

EXAM:
LEFT KNEE - COMPLETE 4+ VIEW

[t knee ap left]
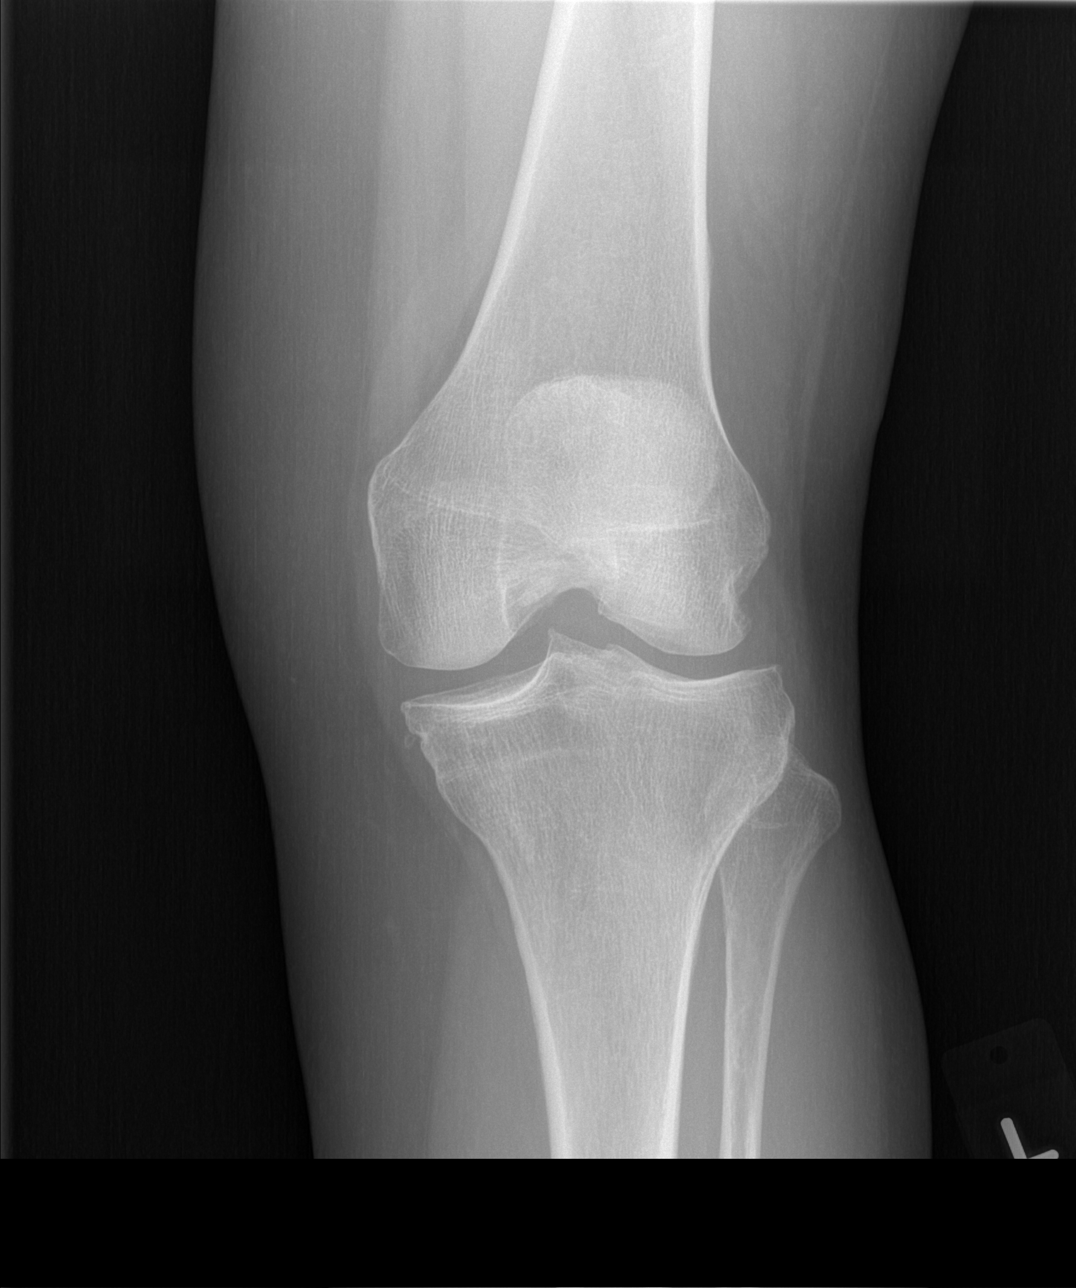

[t knee oblique left (1 of 2)]
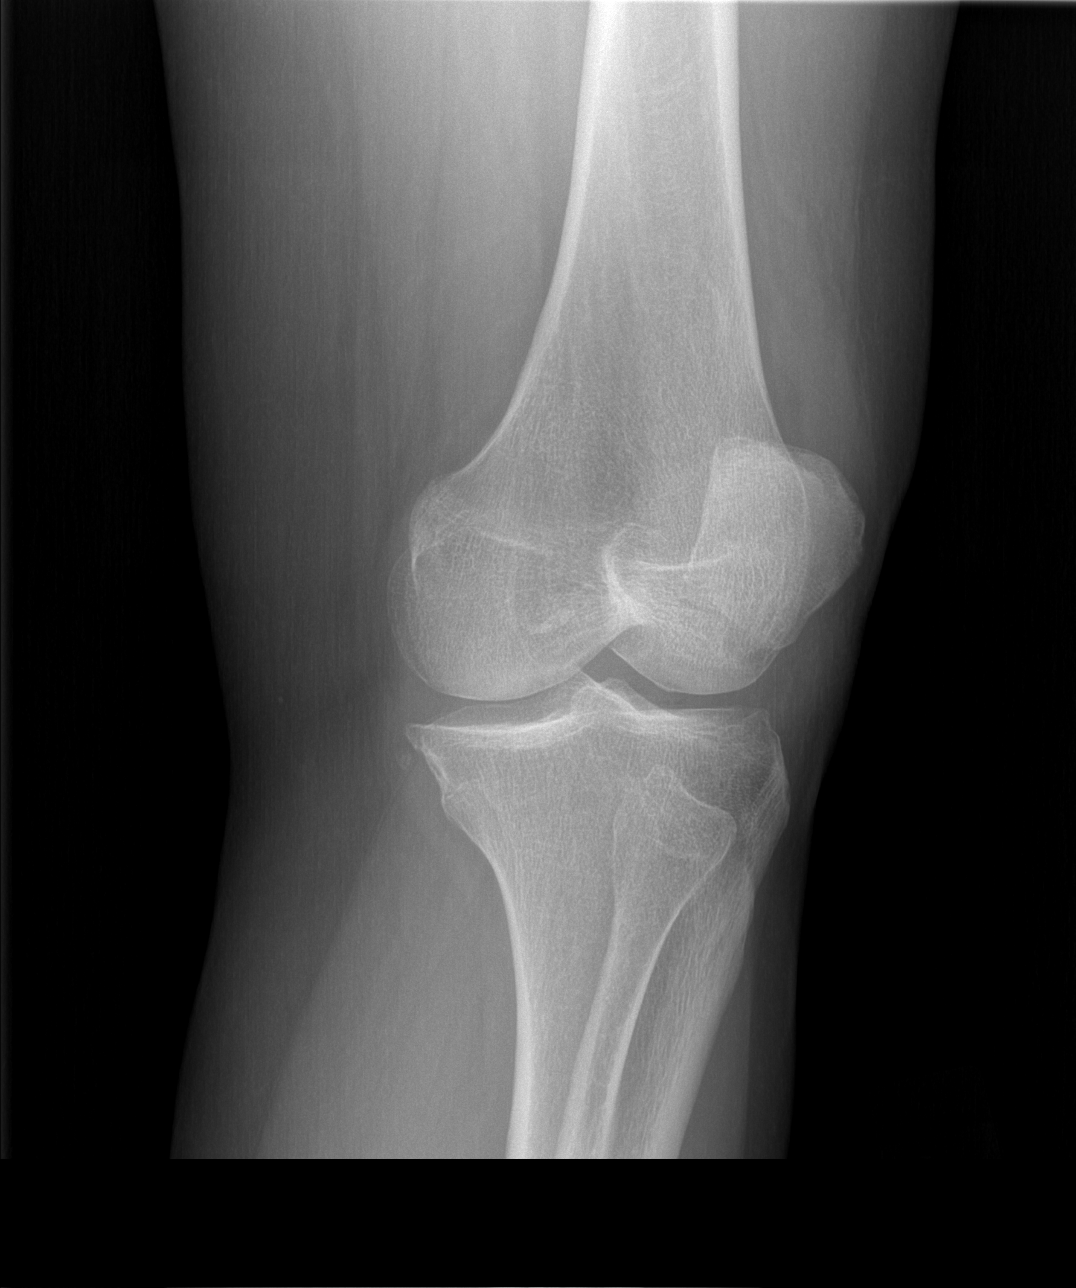

[t knee oblique left (2 of 2)]
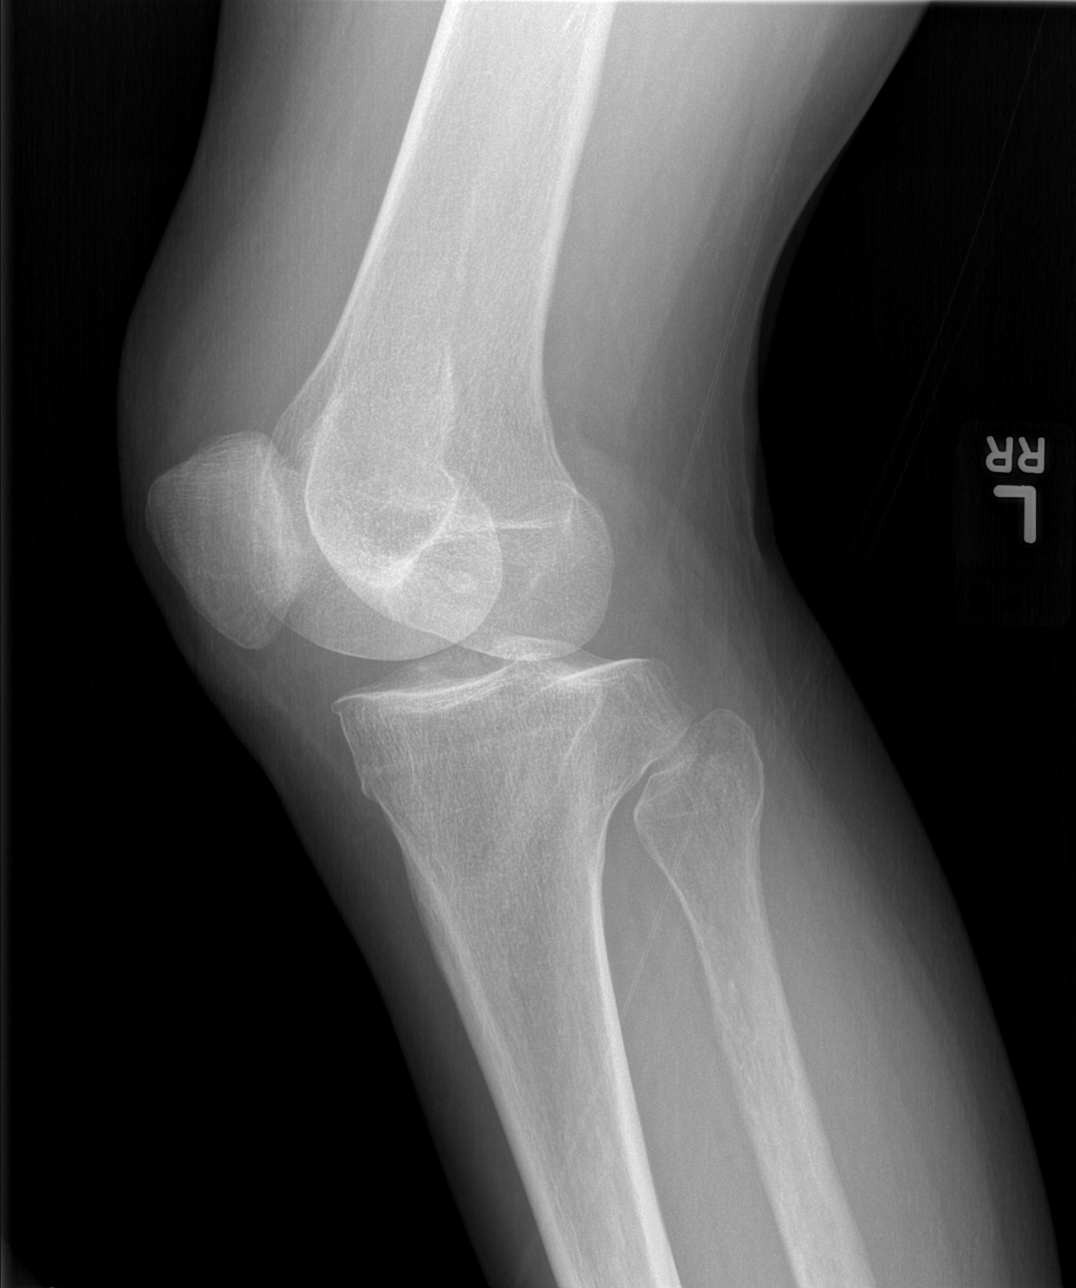

[t knee lat left]
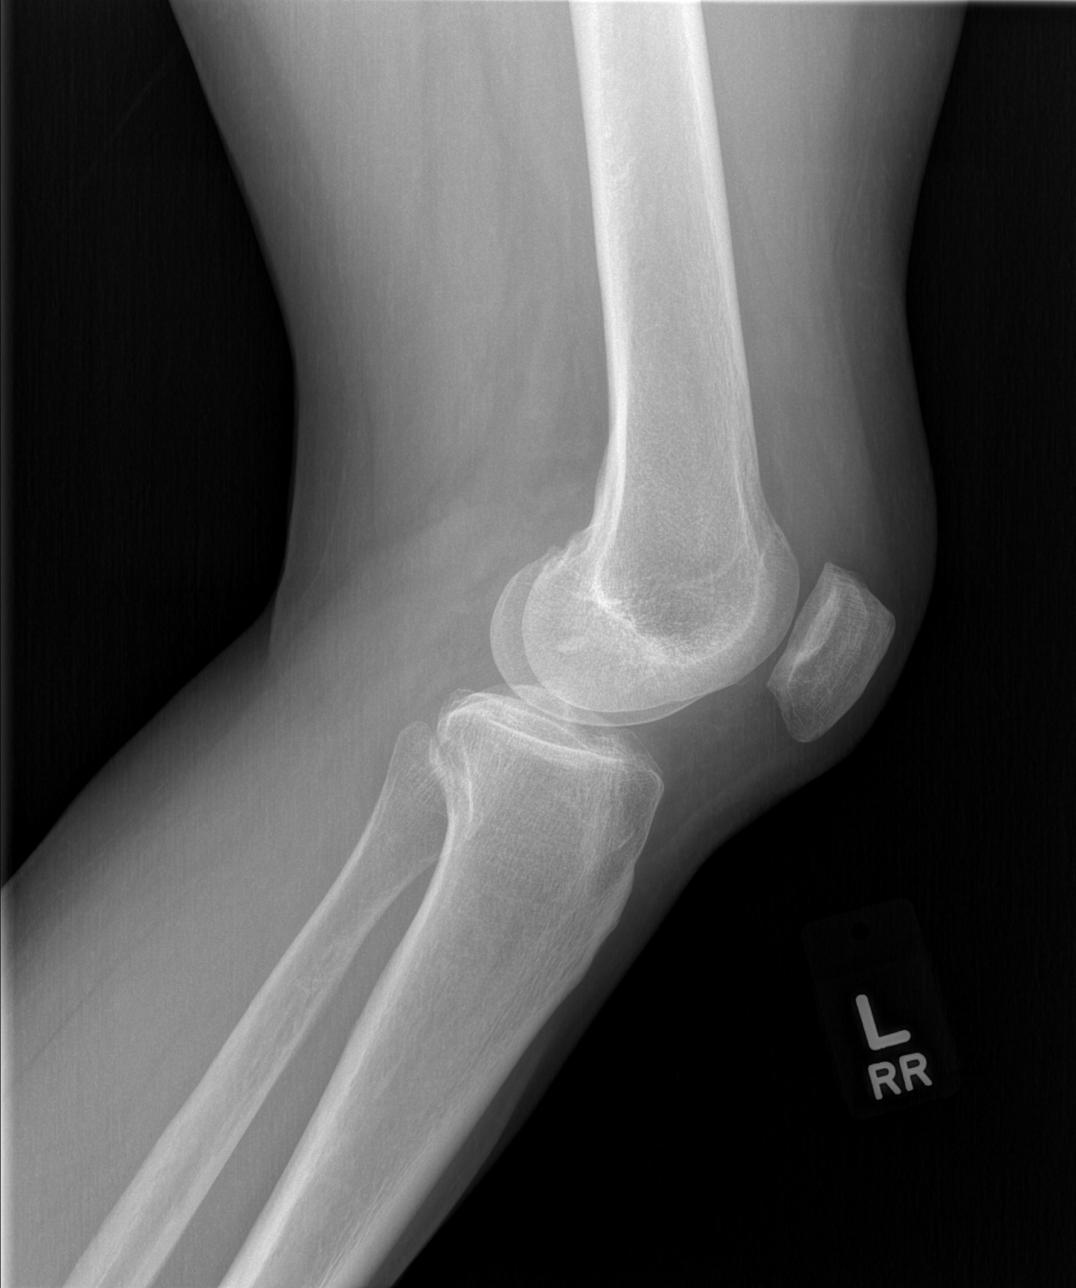

[4 of 4 positions shown; findings below may reference images not displayed]

FINDINGS: No evidence for an acute fracture or dislocation. Mild degenerative
changes evident in all 3 compartments. Joint effusion noted
suprapatellar recess. No worrisome lytic or sclerotic osseous
abnormality.
IMPRESSION: Joint effusion without acute bony abnormality.

## 2023-08-13 ENCOUNTER — Emergency Department (HOSPITAL_BASED_OUTPATIENT_CLINIC_OR_DEPARTMENT_OTHER)
Admission: EM | Admit: 2023-08-13 | Discharge: 2023-08-13 | Disposition: A | Discharge status: Home / Self Care | Payer: No Typology Code available for payment source | Attending: Emergency Medicine | Admitting: Emergency Medicine

## 2023-08-13 ENCOUNTER — Encounter (HOSPITAL_BASED_OUTPATIENT_CLINIC_OR_DEPARTMENT_OTHER): Payer: Self-pay | Admitting: Emergency Medicine

## 2023-08-13 ENCOUNTER — Emergency Department (HOSPITAL_BASED_OUTPATIENT_CLINIC_OR_DEPARTMENT_OTHER): Payer: No Typology Code available for payment source

## 2023-08-13 DIAGNOSIS — W1789XA Other fall from one level to another, initial encounter: Secondary | ICD-10-CM | POA: Diagnosis not present

## 2023-08-13 DIAGNOSIS — S60222A Contusion of left hand, initial encounter: Secondary | ICD-10-CM | POA: Diagnosis not present

## 2023-08-13 DIAGNOSIS — S6992XA Unspecified injury of left wrist, hand and finger(s), initial encounter: Secondary | ICD-10-CM | POA: Diagnosis present

## 2023-08-13 NOTE — ED Triage Notes (Signed)
Pt w/ LT hand pain s/p falling out of boat onto dock 1 wk ago

## 2023-08-13 NOTE — Discharge Instructions (Signed)
Your x-ray showed no signs of fracture or dislocation today.  I suspect your hand pain is due to a bone bruise.  You may take up to 1000mg  of tylenol every 6 hours as needed for pain.  Do not take more then 4g per day.  You may use your wrist brace as needed for comfort.  Return to activities as pain allows.  I have included contact information for an orthopedic office below.  Please contact them if your hand pain has not started to improve within the next 2 weeks.  Return to the ER if you have significant worsening in your pain, numbness or tingling in your hand, any other new or concerning symptoms.

## 2023-08-13 NOTE — ED Provider Notes (Signed)
[place] [facility] AT [facility] Provider Note   CSN: [id number] Arrival date & time: 08/13/23  1405     History  Chief Complaint  Patient presents with   Hand Pain    Haley Martin is a 57 y.o. female who presents with concern for left hand pain for 1 week.  She was getting off of a boat and tripped onto the dock and landed on her outstretched left hand.  She immediately had some pain and swelling over left thumb and pointer finger.  She got a wrist brace and has been wearing that since the accident.  Has been taking Tylenol at home for pain.  She has not had any difficulty with range of motion.  Denies any numbness or tingling in her hand.  She presents because her daughter urged her to come.   Hand Pain       Home Medications Prior to Admission medications   Medication Sig Start Date End Date Taking? Authorizing Provider  amphetamine-dextroamphetamine (ADDERALL) 20 MG tablet Take 20 mg by mouth 3 (three) times daily.    [provider]  ARIPiprazole (ABILIFY) 2 MG tablet Take 2 mg by mouth daily. 02/01/21   [provider]  conjugated estrogens (PREMARIN) vaginal cream Discard plastic applicator. Insert blueberry size amount of cream on finger in vagina daily x1 week then 2x per week. Patient not taking: Reported on 02/10/2021 09/09/20   [provider]  desvenlafaxine (PRISTIQ) 100 MG 24 hr tablet Take 100 mg by mouth every morning. 12/13/19   [provider]  gabapentin (NEURONTIN) 400 MG capsule Take 400 mg by mouth in the morning, at noon, in the evening, and at bedtime.    [provider]  gabapentin (NEURONTIN) 600 MG tablet Take by mouth. 08/25/20   [provider]  hydrochlorothiazide (MICROZIDE) 12.5 MG capsule TAKE 1 CAPSULE BY MOUTH EVERY DAY 05/22/21   Worthy Rancher B, FNP  HYDROcodone-acetaminophen (NORCO/VICODIN) 5-325 MG tablet Take 1 tablet by mouth every 8 (eight) hours as needed. 08/10/21    Myra Rude, MD  mirtazapine (REMERON) 30 MG tablet Take 30 mg by mouth at bedtime. Patient not taking: No sig reported 02/13/20   [provider]  Multiple Vitamins-Minerals (CENTRUM SILVER PO) Take 1 tablet daily by mouth. Patient not taking: No sig reported    [provider]  pantoprazole (PROTONIX) 40 MG tablet TAKE 1 TABLET BY MOUTH EVERY DAY 08/03/21   Sheliah Hatch, MD  tiZANidine (ZANAFLEX) 4 MG tablet TAKE 1 TABLET BY MOUTH EVERY 6 HOURS AS NEEDED FOR MUSCLE SPASMS. 07/07/21   Janeece Agee, NP  traZODone (DESYREL) 50 MG tablet  04/15/20   [provider]      Allergies    Penicillins and Sulfa antibiotics    Review of Systems   Review of Systems  Physical Exam Updated Vital Signs BP 119/85 (BP Location: Right Arm)   Pulse 97   Temp 98 F (36.7 C) (Oral)   Resp 16   Ht 5\' 4"  (1.626 m)   Wt 84.8 kg   LMP 12/11/2013 Comment: several years ago  SpO2 96%   BMI 32.10 kg/m  Physical Exam Vitals and nursing note reviewed.  Constitutional:      Appearance: Normal appearance.  HENT:     Head: Atraumatic.  Pulmonary:     Effort: Pulmonary effort is normal.  Musculoskeletal:     Comments: Left hand without any obvious deformity, edema, erythema.  Contusion  over the dorsal aspect of the left thumb  Patient able to flex and extend the wrist, flex and extend at the 1st through 5th MCPs, PIPs, DIPs.  Nontender to palpation over the ulna or radius, carpal bones, 1st through 5th metacarpals and phalanges  Mildly tender to palpation over the contusion over the dorsal aspect of her left thumb  No snuffbox tenderness  Median, radian, and ulnar nerve sensation and motor function intact  Neurological:     General: No focal deficit present.     Mental Status: She is alert.  Psychiatric:        Mood and Affect: Mood normal.        Behavior: Behavior normal.     ED Results / Procedures / Treatments   Labs (all labs ordered are listed, but  only abnormal results are displayed) Labs Reviewed - No data to display  EKG None  Radiology DG Hand Complete Left  Result Date: 08/13/2023 CLINICAL DATA:  Left hand pain after falling out of a boat onto a dog 1 week ago. EXAM: LEFT HAND - COMPLETE 3+ VIEW COMPARISON:  None Available. FINDINGS: Mildly decreased bone mineralization. Mild interphalangeal joint space narrowing diffusely. Mild thumb carpometacarpal joint space narrowing, subchondral sclerosis, and peripheral osteophytosis. No acute fracture is seen. No dislocation. IMPRESSION: 1. No acute fracture. 2. 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 Electronically Signed   By: Neita Garnet M.D.   On: 08/13/2023 16:41    Procedures Procedures    Medications Ordered in ED Medications - No data to display  ED Course/ Medical Decision Making/ A&P                                 Medical Decision Making Amount and/or Complexity of Data Reviewed Radiology: ordered.   57 y.o. female presents to the ED for concern of left hand pain after fall 1 week ago  Differential diagnosis includes but is not limited to fracture, dislocation, bone contusion, soft tissue injury, tendinitis  ED Course:  Patient presents with pain over the dorsal aspect of her left thumb after a mechanical fall 1 week ago.  She landed on her left outstretched hand.  Did not hit her head or lose consciousness.  She has had full range of motion, station, motor function of the hand.  No obvious deformities, edema on exam.  She is nontender to palpation diffusely of the ulna or radius, carpal bones, metacarpals, phalanges of the left hand.  No snuffbox tenderness.  She is tender over the contusion of her left thumb.  X-rays with no fracture or dislocation appreciated.  Patient appropriate for discharge at this time, suspect the contusion is causing her pain.  Impression: Contusion of the dorsal aspect of the left  thumb  Disposition:  The patient was discharged home with instructions to take Tylenol as needed for pain control.  May wear her wrist brace for comfort.  Return to activities as pain allows.  Follow-up with orthopedics if her pain is not improved within the next 2 weeks. Return precautions given.    Imaging Studies ordered: I ordered imaging studies including x-ray left hand I independently visualized the imaging with scope of interpretation limited to determining acute life threatening conditions related to emergency care. Imaging showed no acute fractures or dislocations I agree with the radiologist interpretation            Final Clinical  Impression(s) / ED Diagnoses Final diagnoses:  Contusion of left hand, initial encounter    Rx / DC Orders ED Discharge Orders     None         Arabella Merles, PA-C 08/13/23 1659    Terrilee Files, MD 08/14/23 1020

## 2024-10-09 ENCOUNTER — Ambulatory Visit (INDEPENDENT_AMBULATORY_CARE_PROVIDER_SITE_OTHER): Admitting: Family Medicine

## 2024-10-09 ENCOUNTER — Ambulatory Visit (INDEPENDENT_AMBULATORY_CARE_PROVIDER_SITE_OTHER)

## 2024-10-09 ENCOUNTER — Encounter: Payer: Self-pay | Admitting: Family Medicine

## 2024-10-09 VITALS — BP 112/84 | HR 78 | Ht 64.0 in | Wt 170.0 lb

## 2024-10-09 DIAGNOSIS — M5412 Radiculopathy, cervical region: Secondary | ICD-10-CM

## 2024-10-09 DIAGNOSIS — M791 Myalgia, unspecified site: Secondary | ICD-10-CM | POA: Diagnosis not present

## 2024-10-09 DIAGNOSIS — M542 Cervicalgia: Secondary | ICD-10-CM | POA: Diagnosis not present

## 2024-10-09 LAB — SEDIMENTATION RATE: Sed Rate: 12 mm/h (ref 0–30)

## 2024-10-09 LAB — CK: Total CK: 63 U/L (ref 17–177)

## 2024-10-09 MED ORDER — TIZANIDINE HCL 4 MG PO TABS: 4.0000 mg | ORAL_TABLET | Freq: Every day | ORAL | 2 refills | Status: DC

## 2024-10-09 MED ORDER — GABAPENTIN 600 MG PO TABS: 600.0000 mg | ORAL_TABLET | Freq: Two times a day (BID) | ORAL | 1 refills | Status: AC

## 2024-10-09 NOTE — Patient Instructions (Addendum)
 Thank you for coming in today.   Please get an Xray and stop by lab today before you leave   Order placed for neck injection  See you back as needed.

## 2024-10-09 NOTE — Progress Notes (Signed)
 I, Leotis Batter, CMA acting as a scribe for Artist Lloyd, MD.  Haley Martin is a 58 y.o. female who presents to [facility] today for neck and shoulder pain x 1+ month. Pt locates pain to bilat neck and bilat shoulder pain. Notes significant relief with epidural injection in the past.   Radiates: neck into the shoulders and upper arms UE numbness/tingling: denies UE weakness: denies Aggravates: side-to-side ROM, lifting heavy objects Treatments tried: Gabapentin 600 mg BID, Tylenol  daily, Tizanidine  at bedtime  Dx imaging: 02/08/21 C-spine MRI  10/21/20 C-spine XR  Pertinent review of systems: No fevers or chills  Relevant historical information: Hypertension   Exam:  BP 112/84   Pulse 78   Ht 5' 4 (1.626 m)   Wt 170 lb (77.1 kg)   LMP 12/11/2013 Comment: several years ago  SpO2 96%   BMI 29.18 kg/m  General: Well Developed, well nourished, and in no acute distress.   MSK: C-spine normal-appearing Nontender palpation spinal midline. Normal cervical motion. Upper Smir strength is intact. Reflexes are intact. Exam bilateral shoulders normal-appearing some pain is present with abduction. Negative Hawkins and Neer's test.    Lab and Radiology Results  X-ray images C-spine obtained today personally and independently interpreted. Mild multilevel DDD Await formal radiology review  EXAM: MRI CERVICAL SPINE WITHOUT CONTRAST   TECHNIQUE: Multiplanar, multisequence MR imaging of the cervical spine was performed. No intravenous contrast was administered.   COMPARISON:  10/21/2020   FINDINGS: Alignment: Straightening of lordosis.   Vertebrae: Normal bone marrow signal intensity. No focal osseous lesion.   Cord: Normal signal and morphology.   Posterior Fossa, vertebral arteries: Negative.   Disc levels: Mild desiccation.   C2-3: No significant disc bulge. Patent spinal canal and neural foramen   C3-4: Shallow central  protrusion. Patent spinal canal and neural foramen.   C4-5: No significant disc bulge. Patent spinal canal and neural foramen   C5-6: Disc osteophyte complex with shallow right subarticular protrusion. Patent spinal canal and neural foramen.   C6-7: Small disc osteophyte complex with shallow right foraminal protrusion. Uncovertebral degenerative spurring. Patent spinal canal and left neural foramen. Minimal to mild right neural foraminal narrowing.   C7-T1: No significant disc bulge. Patent spinal canal and neural foramen   Paraspinal tissues: Negative.   IMPRESSION: Mild degenerative changes. Minimal to mild right C6-7 neural foraminal narrowing.   Shallow central C3-4, right C5-6 subarticular and right C6-7 foraminal protrusions.   No significant spinal canal narrowing or cord impingement.     Electronically Signed   By: Christene Morones M.D.   On: 02/09/2021 08:34 I, Artist Lloyd, personally (independently) visualized and performed the interpretation of the images attached in this note.   Assessment and Plan: 58 y.o. female with neck and arm pain.  Symptoms are very consistent with prior episode of cervical radiculopathy managed with an epidural steroid injection back in 2022.  Plan to do some double checking today.  Will obtain repeat cervical spine x-ray and check sed rate and CK to assess for polymyalgia rheumatica risk.  Will go ahead and arrange for a repeat epidural steroid injection.  If not improved consider further evaluation repeat MRI C-spine.   PDMP not reviewed this encounter. Orders Placed This Encounter  Procedures   DG Cervical Spine 2 or 3 views    Standing Status:   Future    Number of Occurrences:   1    Expiration Date:   10/09/2025  Reason for Exam (SYMPTOM  OR DIAGNOSIS REQUIRED):   neck pain    Is patient pregnant?:   No    Preferred imaging location?:   [facility]   DG INJECT DIAG/THERA/INC NEEDLE/CATH/PLC EPI/CERV/THOR W/IMG     Standing Status:   Future    Expiration Date:   11/09/2024    Reason for Exam (SYMPTOM  OR DIAGNOSIS REQUIRED):   cervical radiculopathy    Preferred Imaging Location?:   [facility]    Is the patient pregnant?:   No   Sedimentation rate    Standing Status:   Future    Number of Occurrences:   1    Expiration Date:   10/09/2025   CK (Creatine Kinase)   No orders of the defined types were placed in this encounter.    Discussed warning signs or symptoms. Please see discharge instructions. Patient expresses understanding.   The above documentation has been reviewed and is accurate and complete Artist Lloyd, M.D.

## 2024-10-10 ENCOUNTER — Ambulatory Visit: Payer: Self-pay | Admitting: Family Medicine

## 2024-10-10 NOTE — Progress Notes (Signed)
Labs look okay.  No sign of polymyalgia rheumatica.

## 2024-10-11 NOTE — Progress Notes (Signed)
 Cervical spine x-ray does show arthritis.  No broken bones.

## 2024-10-24 NOTE — Discharge Instructions (Signed)

## 2024-10-26 ENCOUNTER — Ambulatory Visit
Admission: RE | Admit: 2024-10-26 | Discharge: 2024-10-26 | Disposition: A | Discharge status: Home / Self Care | Source: Ambulatory Visit | Attending: Family Medicine | Admitting: Family Medicine

## 2024-10-26 DIAGNOSIS — M5412 Radiculopathy, cervical region: Secondary | ICD-10-CM

## 2024-10-26 MED ORDER — IOPAMIDOL (ISOVUE-M 300) INJECTION 61%
1.0000 mL | Freq: Once | INTRAMUSCULAR | Status: AC
  Administered 2024-10-26: 1 mL via EPIDURAL

## 2024-10-26 MED ORDER — TRIAMCINOLONE ACETONIDE 40 MG/ML IJ SUSP (RADIOLOGY)
60.0000 mg | Freq: Once | INTRAMUSCULAR | Status: AC
  Administered 2024-10-26: 60 mg via EPIDURAL

## 2025-01-14 ENCOUNTER — Other Ambulatory Visit: Payer: Self-pay | Admitting: Family Medicine

## 2025-01-14 DIAGNOSIS — M542 Cervicalgia: Secondary | ICD-10-CM

## 2025-01-14 DIAGNOSIS — M791 Myalgia, unspecified site: Secondary | ICD-10-CM

## 2025-01-15 ENCOUNTER — Telehealth: Payer: Self-pay | Admitting: Family Medicine

## 2025-01-15 NOTE — Telephone Encounter (Signed)
 Rx refill request approved per Dr. Zollie Pee orders.
